# Patient Record
Sex: Male | Born: 1955
Health system: Southern US, Community
[De-identification: ages and names within clinical notes are randomized; demographics above are authoritative.]

## PROBLEM LIST (undated history)

## (undated) ENCOUNTER — Emergency Department (HOSPITAL_COMMUNITY): Admission: EM | Payer: Federal, State, Local not specified - PPO | Source: Home / Self Care

## (undated) DIAGNOSIS — E559 Vitamin D deficiency, unspecified: Secondary | ICD-10-CM

## (undated) DIAGNOSIS — F17200 Nicotine dependence, unspecified, uncomplicated: Secondary | ICD-10-CM

## (undated) DIAGNOSIS — I2510 Atherosclerotic heart disease of native coronary artery without angina pectoris: Secondary | ICD-10-CM

## (undated) DIAGNOSIS — E118 Type 2 diabetes mellitus with unspecified complications: Secondary | ICD-10-CM

## (undated) DIAGNOSIS — S4980XA Other specified injuries of shoulder and upper arm, unspecified arm, initial encounter: Secondary | ICD-10-CM

## (undated) DIAGNOSIS — G453 Amaurosis fugax: Secondary | ICD-10-CM

## (undated) DIAGNOSIS — I200 Unstable angina: Secondary | ICD-10-CM

## (undated) DIAGNOSIS — E7800 Pure hypercholesterolemia, unspecified: Secondary | ICD-10-CM

## (undated) DIAGNOSIS — R1310 Dysphagia, unspecified: Secondary | ICD-10-CM

## (undated) DIAGNOSIS — M5412 Radiculopathy, cervical region: Secondary | ICD-10-CM

## (undated) DIAGNOSIS — E119 Type 2 diabetes mellitus without complications: Secondary | ICD-10-CM

## (undated) DIAGNOSIS — Z0000 Encounter for general adult medical examination without abnormal findings: Secondary | ICD-10-CM

## (undated) DIAGNOSIS — L301 Dyshidrosis [pompholyx]: Secondary | ICD-10-CM

## (undated) DIAGNOSIS — F329 Major depressive disorder, single episode, unspecified: Secondary | ICD-10-CM

## (undated) DIAGNOSIS — K219 Gastro-esophageal reflux disease without esophagitis: Secondary | ICD-10-CM

## (undated) DIAGNOSIS — G5793 Unspecified mononeuropathy of bilateral lower limbs: Secondary | ICD-10-CM

## (undated) DIAGNOSIS — I25118 Atherosclerotic heart disease of native coronary artery with other forms of angina pectoris: Secondary | ICD-10-CM

## (undated) DIAGNOSIS — C9030 Solitary plasmacytoma not having achieved remission: Secondary | ICD-10-CM

## (undated) DIAGNOSIS — I6529 Occlusion and stenosis of unspecified carotid artery: Secondary | ICD-10-CM

## (undated) DIAGNOSIS — R9431 Abnormal electrocardiogram [ECG] [EKG]: Secondary | ICD-10-CM

## (undated) DIAGNOSIS — Z9289 Personal history of other medical treatment: Secondary | ICD-10-CM

## (undated) DIAGNOSIS — I6509 Occlusion and stenosis of unspecified vertebral artery: Secondary | ICD-10-CM

## (undated) DIAGNOSIS — R0602 Shortness of breath: Secondary | ICD-10-CM

## (undated) DIAGNOSIS — I10 Essential (primary) hypertension: Secondary | ICD-10-CM

## (undated) DIAGNOSIS — M899 Disorder of bone, unspecified: Secondary | ICD-10-CM

## (undated) DIAGNOSIS — E785 Hyperlipidemia, unspecified: Secondary | ICD-10-CM

## (undated) DIAGNOSIS — I2583 Coronary atherosclerosis due to lipid rich plaque: Secondary | ICD-10-CM

## (undated) DIAGNOSIS — R9439 Abnormal result of other cardiovascular function study: Secondary | ICD-10-CM

## (undated) HISTORY — DX: Amaurosis fugax: G45.3

## (undated) HISTORY — DX: Nicotine dependence, unspecified, uncomplicated: F17.200

## (undated) HISTORY — DX: Pure hypercholesterolemia, unspecified: E78.00

## (undated) HISTORY — DX: Hyperlipidemia, unspecified: E78.5

## (undated) HISTORY — DX: Type 2 diabetes mellitus with unspecified complications: E11.8

## (undated) HISTORY — DX: Unspecified mononeuropathy of bilateral lower limbs: G57.93

## (undated) HISTORY — DX: Occlusion and stenosis of unspecified vertebral artery: I65.09

## (undated) HISTORY — DX: Atherosclerotic heart disease of native coronary artery with other forms of angina pectoris: I25.118

## (undated) HISTORY — DX: Unstable angina: I20.0

## (undated) HISTORY — DX: Atherosclerotic heart disease of native coronary artery without angina pectoris: I25.10

## (undated) HISTORY — DX: Solitary plasmacytoma not having achieved remission: C90.30

## (undated) HISTORY — DX: Disorder of bone, unspecified: M89.9

## (undated) HISTORY — DX: Dysphagia, unspecified: R13.10

## (undated) HISTORY — DX: Radiculopathy, cervical region: M54.12

## (undated) HISTORY — DX: Coronary atherosclerosis due to lipid rich plaque: I25.83

## (undated) HISTORY — DX: Vitamin D deficiency, unspecified: E55.9

## (undated) HISTORY — DX: Abnormal electrocardiogram (ECG) (EKG): R94.31

## (undated) HISTORY — DX: Essential (primary) hypertension: I10

## (undated) HISTORY — DX: Gastro-esophageal reflux disease without esophagitis: K21.9

## (undated) HISTORY — DX: Major depressive disorder, single episode, unspecified: F32.9

## (undated) HISTORY — DX: Encounter for general adult medical examination without abnormal findings: Z00.00

## (undated) HISTORY — PX: CATARACT EXTRACTION: SUR2

## (undated) HISTORY — DX: Abnormal result of other cardiovascular function study: R94.39

## (undated) HISTORY — DX: Occlusion and stenosis of unspecified carotid artery: I65.29

## (undated) HISTORY — DX: Shortness of breath: R06.02

## (undated) HISTORY — DX: Personal history of other medical treatment: Z92.89

## (undated) HISTORY — DX: Dyshidrosis (pompholyx): L30.1

## (undated) HISTORY — DX: Other specified injuries of shoulder and upper arm, unspecified arm, initial encounter: S49.80XA

---

## 1984-11-30 HISTORY — PX: INGUINAL HERNIA REPAIR: SUR1180

## 2009-03-21 ENCOUNTER — Encounter: Payer: Self-pay | Admitting: Internal Medicine

## 2009-03-21 ENCOUNTER — Telehealth (INDEPENDENT_AMBULATORY_CARE_PROVIDER_SITE_OTHER): Payer: Self-pay | Admitting: Radiology

## 2009-03-21 ENCOUNTER — Ambulatory Visit: Payer: Self-pay | Admitting: Internal Medicine

## 2009-03-21 DIAGNOSIS — F3289 Other specified depressive episodes: Secondary | ICD-10-CM

## 2009-03-21 DIAGNOSIS — R0609 Other forms of dyspnea: Secondary | ICD-10-CM | POA: Insufficient documentation

## 2009-03-21 DIAGNOSIS — R0989 Other specified symptoms and signs involving the circulatory and respiratory systems: Secondary | ICD-10-CM | POA: Insufficient documentation

## 2009-03-21 DIAGNOSIS — F17200 Nicotine dependence, unspecified, uncomplicated: Secondary | ICD-10-CM

## 2009-03-21 DIAGNOSIS — F329 Major depressive disorder, single episode, unspecified: Secondary | ICD-10-CM

## 2009-03-21 DIAGNOSIS — R9431 Abnormal electrocardiogram [ECG] [EKG]: Secondary | ICD-10-CM

## 2009-03-21 HISTORY — DX: Nicotine dependence, unspecified, uncomplicated: F17.200

## 2009-03-21 HISTORY — DX: Major depressive disorder, single episode, unspecified: F32.9

## 2009-03-21 HISTORY — DX: Other specified depressive episodes: F32.89

## 2009-03-21 HISTORY — DX: Abnormal electrocardiogram (ECG) (EKG): R94.31

## 2009-03-21 LAB — CONVERTED CEMR LAB
ALT: 18 units/L (ref 0–53)
AST: 20 units/L (ref 0–37)
Albumin: 4 g/dL (ref 3.5–5.2)
Alkaline Phosphatase: 61 units/L (ref 39–117)
BUN: 15 mg/dL (ref 6–23)
Basophils Absolute: 0.1 10*3/uL (ref 0.0–0.1)
Basophils Relative: 0.8 % (ref 0.0–3.0)
Bilirubin Urine: NEGATIVE
Bilirubin, Direct: 0.1 mg/dL (ref 0.0–0.3)
CO2: 31 meq/L (ref 19–32)
Calcium: 9.2 mg/dL (ref 8.4–10.5)
Chloride: 105 meq/L (ref 96–112)
Cholesterol: 243 mg/dL — ABNORMAL HIGH (ref 0–200)
Creatinine, Ser: 0.7 mg/dL (ref 0.4–1.5)
Direct LDL: 167.4 mg/dL
Eosinophils Absolute: 0.2 10*3/uL (ref 0.0–0.7)
Eosinophils Relative: 3.3 % (ref 0.0–5.0)
GFR calc non Af Amer: 125.87 mL/min (ref >60)
Glucose, Bld: 89 mg/dL (ref 70–99)
HCT: 42.5 % (ref 39.0–52.0)
HDL: 32.8 mg/dL — ABNORMAL LOW (ref >39.00)
Hemoglobin, Urine: NEGATIVE
Hemoglobin: 14.2 g/dL (ref 13.0–17.0)
Ketones, ur: NEGATIVE mg/dL
Leukocytes, UA: NEGATIVE
Lymphocytes Relative: 32.2 % (ref 12.0–46.0)
Lymphs Abs: 2.4 10*3/uL (ref 0.7–4.0)
MCHC: 33.3 g/dL (ref 30.0–36.0)
MCV: 87.4 fL (ref 78.0–100.0)
Monocytes Absolute: 0.7 10*3/uL (ref 0.1–1.0)
Monocytes Relative: 10.2 % (ref 3.0–12.0)
Neutro Abs: 3.9 10*3/uL (ref 1.4–7.7)
Neutrophils Relative %: 53.5 % (ref 43.0–77.0)
Nitrite: NEGATIVE
Platelets: 214 10*3/uL (ref 150.0–400.0)
Potassium: 4.2 meq/L (ref 3.5–5.1)
Pro B Natriuretic peptide (BNP): 6 pg/mL (ref 0.0–100.0)
RBC: 4.86 M/uL (ref 4.22–5.81)
RDW: 12.2 % (ref 11.5–14.6)
Sodium: 140 meq/L (ref 135–145)
Specific Gravity, Urine: 1.015 (ref 1.000–1.030)
TSH: 1.48 microintl units/mL (ref 0.35–5.50)
Total Bilirubin: 0.8 mg/dL (ref 0.3–1.2)
Total CHOL/HDL Ratio: 7
Total Protein, Urine: NEGATIVE mg/dL
Total Protein: 7.3 g/dL (ref 6.0–8.3)
Triglycerides: 170 mg/dL — ABNORMAL HIGH (ref 0.0–149.0)
Urine Glucose: NEGATIVE mg/dL
Urobilinogen, UA: 0.2 (ref 0.0–1.0)
VLDL: 34 mg/dL (ref 0.0–40.0)
WBC: 7.3 10*3/uL (ref 4.5–10.5)
pH: 6.5 (ref 5.0–8.0)

## 2009-05-29 ENCOUNTER — Telehealth: Payer: Self-pay | Admitting: Internal Medicine

## 2009-06-05 ENCOUNTER — Ambulatory Visit: Payer: Self-pay | Admitting: Internal Medicine

## 2009-06-11 ENCOUNTER — Telehealth (INDEPENDENT_AMBULATORY_CARE_PROVIDER_SITE_OTHER): Payer: Self-pay | Admitting: Radiology

## 2009-06-17 ENCOUNTER — Telehealth (INDEPENDENT_AMBULATORY_CARE_PROVIDER_SITE_OTHER): Payer: Self-pay | Admitting: Radiology

## 2009-06-18 ENCOUNTER — Ambulatory Visit: Payer: Self-pay

## 2009-06-18 ENCOUNTER — Encounter: Payer: Self-pay | Admitting: Cardiology

## 2009-06-19 ENCOUNTER — Ambulatory Visit: Payer: Self-pay | Admitting: Internal Medicine

## 2009-06-19 DIAGNOSIS — I10 Essential (primary) hypertension: Secondary | ICD-10-CM | POA: Insufficient documentation

## 2009-06-19 DIAGNOSIS — K219 Gastro-esophageal reflux disease without esophagitis: Secondary | ICD-10-CM

## 2009-06-19 HISTORY — DX: Essential (primary) hypertension: I10

## 2009-06-19 HISTORY — DX: Gastro-esophageal reflux disease without esophagitis: K21.9

## 2009-07-10 ENCOUNTER — Ambulatory Visit: Payer: Self-pay | Admitting: Internal Medicine

## 2009-08-14 ENCOUNTER — Ambulatory Visit: Payer: Self-pay | Admitting: Internal Medicine

## 2009-08-14 ENCOUNTER — Telehealth: Payer: Self-pay | Admitting: Internal Medicine

## 2009-09-13 ENCOUNTER — Telehealth: Payer: Self-pay | Admitting: Internal Medicine

## 2010-07-02 ENCOUNTER — Ambulatory Visit: Payer: Self-pay | Admitting: Internal Medicine

## 2010-07-02 DIAGNOSIS — M5412 Radiculopathy, cervical region: Secondary | ICD-10-CM

## 2010-07-02 DIAGNOSIS — S46909A Unspecified injury of unspecified muscle, fascia and tendon at shoulder and upper arm level, unspecified arm, initial encounter: Secondary | ICD-10-CM

## 2010-07-02 DIAGNOSIS — M25511 Pain in right shoulder: Secondary | ICD-10-CM | POA: Insufficient documentation

## 2010-07-02 DIAGNOSIS — S4980XA Other specified injuries of shoulder and upper arm, unspecified arm, initial encounter: Secondary | ICD-10-CM

## 2010-07-02 DIAGNOSIS — M542 Cervicalgia: Secondary | ICD-10-CM | POA: Insufficient documentation

## 2010-07-02 HISTORY — DX: Radiculopathy, cervical region: M54.12

## 2010-07-02 HISTORY — DX: Other specified injuries of shoulder and upper arm, unspecified arm, initial encounter: S49.80XA

## 2010-07-02 HISTORY — DX: Unspecified injury of unspecified muscle, fascia and tendon at shoulder and upper arm level, unspecified arm, initial encounter: S46.909A

## 2010-12-21 ENCOUNTER — Encounter: Payer: Self-pay | Admitting: Internal Medicine

## 2010-12-30 NOTE — Assessment & Plan Note (Signed)
Summary: right shoulder pain./cd   Vital Signs:  Patient profile:   55 year old male Height:      68 inches Weight:      145.50 pounds BMI:     22.20 O2 Sat:      97 % on Room air Temp:     98.2 degrees F oral Pulse rate:   85 / minute Pulse rhythm:   regular Resp:     16 per minute BP sitting:   122 / 70  (left arm) Cuff size:   large  Vitals Entered By: Rock Nephew CMA (July 02, 2010 8:32 AM)  O2 Flow:  Room air CC: Pt c/o right side shoulder/back pain// also had neck pain x 10days ago, Hypertension Management Is Patient Diabetic? No Pain Assessment Patient in pain? yes     Location: R shoulder/back Intensity: 8 Type: sharp/stabbing   Primary Care Provider:  Etta Grandchild MD  CC:  Pt c/o right side shoulder/back pain// also had neck pain x 10days ago and Hypertension Management.  History of Present Illness: He returns c/o injuring his neck about 2 weeks ago when he was lifting weights with his son. The neck pain radiates into his RUE and he has noticed fasiculations in his right arm. He says that he went to an [facility] and had normal plain xrays of his neck and has been taking ? muscle relaxer and hydrocodone but no nsaids. He doesn't feel like the pain is getting any better. He has not noticed any numbness, weakness, or tingling in his arms or legs. He also has right shoulder pain over the lateral deltoid that he thinks he had prior to the neck pain.  Hypertension History:      He denies headache, chest pain, palpitations, dyspnea with exertion, orthopnea, PND, peripheral edema, visual symptoms, neurologic problems, syncope, and side effects from treatment.  He notes no problems with any antihypertensive medication side effects.        Positive major cardiovascular risk factors include male age 47 years old or older, hypertension, and current tobacco user.  Negative major cardiovascular risk factors include no history of diabetes or hyperlipidemia and negative family  history for ischemic heart disease.        Positive history for target organ damage include prior stroke (or TIA).  Further assessment for target organ damage reveals no history of ASHD, cardiac end-organ damage (CHF/LVH), peripheral vascular disease, renal insufficiency, or hypertensive retinopathy.    Preventive Screening-Counseling & Management  Alcohol-Tobacco     Alcohol drinks/day: 0     Smoking Status: current     Smoking Cessation Counseling: yes     Smoke Cessation Stage: contemplative     Packs/Day: 0.5     Year Started: 1976     Pack years: 75     Tobacco Counseling: to quit use of tobacco products  Hep-HIV-STD-Contraception     Hepatitis Risk: no risk noted     HIV Risk: no risk noted     STD Risk: no risk noted     TSE monthly: yes      Sexual History:  currently monogamous.        Drug Use:  never and no.        Blood Transfusions:  no.        Travel History:  [place].    Medications Prior to Update: 1)  Benicar 20 Mg Tabs (Olmesartan Medoxomil) .... Once Daily For High Blood Pressure 2)  Jonne Ply  81mg  .... Take 1 Tablet By Mouth Once A Day 3)  Prilosec Otc 20 Mg Tbec (Omeprazole Magnesium) .... Once Daily For Acid Reflux  Current Medications (verified): 1)  Benicar 20 Mg Tabs (Olmesartan Medoxomil) .... Once Daily For High Blood Pressure 2)  Asa 81mg  .... Take 1 Tablet By Mouth Once A Day 3)  Prilosec Otc 20 Mg Tbec (Omeprazole Magnesium) .... Once Daily For Acid Reflux 4)  Naproxen 500 Mg Tabs (Naproxen) .... One By Mouth Two Times A Day Woth Food For Pain  Allergies (verified): No Known Drug Allergies  Past History:  Past Medical History: Last updated: 06/19/2009 Depression-never treated GERD Hypertension  Past Surgical History: Last updated: 03/21/2009 cataracts removed: right in 2009 at [facility], Left in 2004 in [place]  Family History: Last updated: 03/21/2009 Family History of Arthritis Family History Diabetes 1st degree relative Family  History of Stroke M 1st degree relative <50  Social History: Last updated: 03/21/2009 Occupation: Naval architect Married Current Smoker Alcohol use-no Drug use-no Regular exercise-no Religion affecting care: [place], raised in [place]  Risk Factors: Alcohol Use: 0 (07/02/2010) Exercise: no (03/21/2009)  Risk Factors: Smoking Status: current (07/02/2010) Packs/Day: 0.5 (07/02/2010)  Family History: Reviewed history from 03/21/2009 and no changes required. Family History of Arthritis Family History Diabetes 1st degree relative Family History of Stroke M 1st degree relative <50  Social History: Reviewed history from 03/21/2009 and no changes required. Occupation: Naval architect Married Current Smoker Alcohol use-no Drug use-no Regular exercise-no Religion affecting care: [place], raised in [place]  Review of Systems  The patient denies anorexia, fever, weight loss, weight gain, decreased hearing, hoarseness, chest pain, syncope, peripheral edema, prolonged cough, headaches, hemoptysis, abdominal pain, muscle weakness, suspicious skin lesions, depression, and enlarged lymph nodes.    Physical Exam  General:  alert, well-developed, well-nourished, well-hydrated, appropriate dress, normal appearance, healthy-appearing, cooperative to examination, and good hygiene.   Head:  normocephalic and atraumatic.   Eyes:  vision grossly intact, pupils equal, pupils round, and pupils reactive to light.   Neck:  supple, full ROM, no masses, no cervical lymphadenopathy, and no neck tenderness.   Lungs:  Normal respiratory effort, chest expands symmetrically. Lungs are clear to auscultation, no crackles or wheezes. Heart:  Normal rate and regular rhythm. S1 and S2 normal without gallop, murmur, click, rub or other extra sounds. Abdomen:  Bowel sounds positive,abdomen soft and non-tender without masses, organomegaly or hernias noted. Msk:  normal ROM, no joint tenderness, no  joint swelling, no joint warmth, no redness over joints, no joint deformities, no joint instability, no crepitation, and no muscle atrophy.   Pulses:  R and L carotid,radial,femoral,dorsalis pedis and posterior tibial pulses are full and equal bilaterally Extremities:  No clubbing, cyanosis, edema, or deformity noted with normal full range of motion of all joints.   Neurologic:  alert & oriented X3, cranial nerves II-XII intact, strength normal in all extremities, sensation intact to light touch, sensation intact to pinprick, gait normal, finger-to-nose normal, heel-to-shin normal, toes down bilaterally on Babinski, Romberg negative, RUE hyporeflexia, RLE hyporeflexia, LUE hyporeflexia, and LLE hyporeflexia.   Skin:  turgor normal, color normal, no rashes, no suspicious lesions, no ecchymoses, no petechiae, no purpura, no ulcerations, and no edema.   Cervical Nodes:  no anterior cervical adenopathy and no posterior cervical adenopathy.   Axillary Nodes:  no R axillary adenopathy and no L axillary adenopathy.   Psych:  Cognition and judgment appear intact. Alert and cooperative with normal attention span and concentration.  No apparent delusions, illusions, hallucinations   Impression & Recommendations:  Problem # 1:  CERVICAL RADICULOPATHY (ICD-723.4) Assessment New I think he has a herniated disc with impingement so will try depo-medrol IM and order MRI of C-spine Orders: Radiology Referral (Radiology)  Problem # 2:  SHOULDER INJURY, RIGHT (ICD-959.2) Assessment: New will look for boney lesions, spurs, dislocation, etc. Orders: Admin of Therapeutic Inj  intramuscular or subcutaneous (25366) Depo- Medrol 40mg  (J1030) Depo- Medrol 80mg  (J1040) Ketorolac-Toradol 15mg  (Y4034) T-Shoulder Right (73030TC)  Problem # 3:  NECK PAIN, RIGHT (ICD-723.1) try toradol His updated medication list for this problem includes:    Naproxen 500 Mg Tabs (Naproxen) ..... One by mouth two times a day woth food  for pain  Orders: Radiology Referral (Radiology)  Problem # 4:  HYPERTENSION (ICD-401.9) Assessment: Improved  His updated medication list for this problem includes:    Benicar 20 Mg Tabs (Olmesartan medoxomil) ..... Once daily for high blood pressure  BP today: 122/70 Prior BP: 120/76 (08/14/2009)  Prior 10 Yr Risk Heart Disease: Not enough information (03/21/2009)  Labs Reviewed: K+: 4.2 (03/21/2009) Creat: : 0.7 (03/21/2009)   Chol: 243 (03/21/2009)   HDL: 32.80 (03/21/2009)   TG: 170.0 (03/21/2009)  Complete Medication List: 1)  Benicar 20 Mg Tabs (Olmesartan medoxomil) .... Once daily for high blood pressure 2)  Asa 81mg   .... Take 1 tablet by mouth once a day 3)  Prilosec Otc 20 Mg Tbec (Omeprazole magnesium) .... Once daily for acid reflux 4)  Naproxen 500 Mg Tabs (Naproxen) .... One by mouth two times a day woth food for pain  Hypertension Assessment/Plan:      [name] hypertensive risk group is category C: Target organ damage and/or diabetes.  Today's blood pressure is 122/70.  His blood pressure goal is < 140/90.   Patient Instructions: 1)  Please schedule a follow-up appointment in 1 month. 2)  It is important that you exercise regularly at least 20 minutes 5 times a week. If you develop chest pain, have severe difficulty breathing, or feel very tired , stop exercising immediately and seek medical attention. 3)  Check your Blood Pressure regularly. If it is above 140/90: you should make an appointment. 4)  Take 650-1000mg  of Tylenol every 4-6 hours as needed for relief of pain or comfort of fever AVOID taking more than 4000mg   in a 24 hour period (can cause liver damage in higher doses). 5)  You may move around but avoid painful motions. Apply ice to sore area for 20 minutes 3-4 times a day for 2-3 days. 6)  Most patients (90%) with low back pain will improve with time (2-6 weeks). Keep active but avoid activities that are painful. Apply moist heat and/or ice to  lower back several times a day. Prescriptions: NAPROXEN 500 MG TABS (NAPROXEN) One by mouth two times a day woth food for pain  #50 x 0   Entered and Authorized by:   Etta Grandchild MD   Signed by:   Etta Grandchild MD on 07/02/2010   Method used:   Print then Give to Patient   RxID:   [id number]    Not Administered:    Influenza Vaccine not given due to: declined   Medication Administration  Injection # 1:    Medication: Depo- Medrol 80mg     Diagnosis: SHOULDER INJURY, RIGHT (ICD-959.2)    Route: IM    Site: R deltoid    Exp Date: 02/2013    Lot #:  [id number]    Mfr: pfizer    Patient tolerated injection without complications    Given by: Rock Nephew CMA (July 02, 2010 9:16 AM)  Injection # 2:    Medication: Depo- Medrol 40mg     Diagnosis: SHOULDER INJURY, RIGHT (ICD-959.2)    Route: IM    Site: R deltoid    Exp Date: 02/2013    Lot #: [id number]    Mfr: pfizer    Patient tolerated injection without complications    Given by: Rock Nephew CMA (July 02, 2010 9:16 AM)  Injection # 3:    Medication: Ketorolac-Toradol 15mg     Diagnosis: SHOULDER INJURY, RIGHT (ICD-959.2)    Route: IM    Site: RUOQ gluteus    Exp Date: 03/31/2011    Lot #: [id number]    Mfr: nova Plus    Patient tolerated injection without complications    Given by: Rock Nephew CMA (July 02, 2010 9:17 AM)  Orders Added: 1)  Admin of Therapeutic Inj  intramuscular or subcutaneous [96372] 2)  Depo- Medrol 40mg  [J1030] 3)  Depo- Medrol 80mg  [J1040] 4)  Ketorolac-Toradol 15mg  [J1885] 5)  Radiology Referral [Radiology] 6)  T-Shoulder Right [73030TC] 7)  Est. Patient Level IV [95621]  Appended Document: right shoulder pain./cd Patient received 60mg  of Ketorlac/la

## 2011-05-07 ENCOUNTER — Other Ambulatory Visit: Payer: Self-pay | Admitting: Internal Medicine

## 2011-12-29 ENCOUNTER — Other Ambulatory Visit: Payer: Self-pay | Admitting: Internal Medicine

## 2012-10-28 ENCOUNTER — Other Ambulatory Visit: Payer: Self-pay | Admitting: Internal Medicine

## 2013-03-22 ENCOUNTER — Encounter: Payer: Self-pay | Admitting: Internal Medicine

## 2013-03-22 ENCOUNTER — Ambulatory Visit (INDEPENDENT_AMBULATORY_CARE_PROVIDER_SITE_OTHER): Payer: Federal, State, Local not specified - PPO | Admitting: Internal Medicine

## 2013-03-22 VITALS — BP 120/72 | HR 99 | Temp 98.0°F | Ht 68.0 in | Wt 151.0 lb

## 2013-03-22 DIAGNOSIS — M5412 Radiculopathy, cervical region: Secondary | ICD-10-CM

## 2013-03-22 DIAGNOSIS — M5010 Cervical disc disorder with radiculopathy, unspecified cervical region: Secondary | ICD-10-CM

## 2013-03-22 MED ORDER — PREDNISONE 10 MG PO TABS: ORAL_TABLET | ORAL | Status: DC

## 2013-03-22 MED ORDER — DICLOFENAC SODIUM 1 % TD GEL: 2.0000 g | Freq: Four times a day (QID) | TRANSDERMAL | Status: DC

## 2013-03-22 MED ORDER — CYCLOBENZAPRINE HCL 5 MG PO TABS: 5.0000 mg | ORAL_TABLET | Freq: Three times a day (TID) | ORAL | Status: DC | PRN

## 2013-03-22 NOTE — Progress Notes (Signed)
  Subjective:    Patient ID: Donald Jennings, male    DOB: September 04, 1956, 57 y.o.   MRN: [id number]  HPI  Pt presents to the clinic today with c/o right shoulder pain. This started about 1 week ago. He c/o pain in his neck and has radiating pain down his right arm. He has not taken anything for the pain. He has had a previous injury to the right shoulder. This occurred a few years ago.   Review of Systems  History reviewed. No pertinent past medical history.  Current Outpatient Prescriptions  Medication Sig Dispense Refill  . BENICAR 20 MG tablet TAKE 1 TABLET EVERY DAY FOR HIGH BLOOD PRESSURE  90 tablet  3  . omeprazole (PRILOSEC OTC) 20 MG tablet Take 20 mg by mouth daily.       No current facility-administered medications for this visit.    No Known Allergies  History reviewed. No pertinent family history.  History   Social History  . Marital Status: Married    Spouse Name: N/A    Number of Children: N/A  . Years of Education: N/A   Occupational History  . Not on file.   Social History Main Topics  . Smoking status: Current Every Day Smoker  . Smokeless tobacco: Not on file  . Alcohol Use: Not on file  . Drug Use: Not on file  . Sexually Active: Not on file   Other Topics Concern  . Not on file   Social History Narrative  . No narrative on file     Constitutional: Denies fever, malaise, fatigue, headache or abrupt weight changes.  Musculoskeletal: Pt reports right shoulder pain. Denies decrease in range of motion, difficulty with gait, or joint swelling.  Neurological: Pt reports sharp shooting pains down his right arm. Denies dizziness, difficulty with memory, difficulty with speech or problems with balance and coordination.   No other specific complaints in a complete review of systems (except as listed in HPI above).     Objective:   Physical Exam   BP 120/72  Pulse 99  Temp(Src) 98 F (36.7 C) (Oral)  Ht 5\' 8"  (1.727 m)  Wt 151 lb (68.493 kg)  BMI 22.96  kg/m2  SpO2 96% Wt Readings from Last 3 Encounters:  03/22/13 151 lb (68.493 kg)  07/02/10 145 lb 8 oz (65.998 kg)  08/14/09 142 lb (64.411 kg)    General: Appears his stated age, well developed, well nourished in NAD. Neck: Normal range of motion. Neck supple, trachea midline. No massses, lumps or thyromegaly present.  Cardiovascular: Normal rate and rhythm. S1,S2 noted.  No murmur, rubs or gallops noted. No JVD or BLE edema. No carotid bruits noted. Pulmonary/Chest: Normal effort and positive vesicular breath sounds. No respiratory distress. No wheezes, rales or ronchi noted.  Neurological: Alert and oriented. Cranial nerves II-XII intact. Coordination normal. +DTRs bilaterally.    Assessment & Plan:   Neck pain with cervical radiculopathy, recurrent:  Reviewed xrays of right shoulder taken from [facility] Will treat will pred taper and muscle relaxer If not better in 1 week may need referral to ortho for MRI of neck eRx for Voltaren gel  RTC as needed or if pain persist or worsens

## 2013-03-22 NOTE — Patient Instructions (Signed)
Shoulder Exercises  EXERCISES   RANGE OF MOTION (ROM) AND STRETCHING EXERCISES  These exercises may help you when beginning to rehabilitate your injury. Your symptoms may resolve with or without further involvement from your physician, physical therapist or athletic trainer. While completing these exercises, remember:   · Restoring tissue flexibility helps normal motion to return to the joints. This allows healthier, less painful movement and activity.  · An effective stretch should be held for at least 30 seconds.  · A stretch should never be painful. You should only feel a gentle lengthening or release in the stretched tissue.  ROM - Pendulum  · Bend at the waist so that your right / left arm falls away from your body. Support yourself with your opposite hand on a solid surface, such as a table or a countertop.  · Your right / left arm should be perpendicular to the ground. If it is not perpendicular, you need to lean over farther. Relax the muscles in your right / left arm and shoulder as much as possible.  · Gently sway your hips and trunk so they move your right / left arm without any use of your right / left shoulder muscles.  · Progress your movements so that your right / left arm moves side to side, then forward and backward, and finally, both clockwise and counterclockwise.  · Complete __________ repetitions in each direction. Many people use this exercise to relieve discomfort in their shoulder as well as to gain range of motion.  Repeat __________ times. Complete this exercise __________ times per day.  STRETCH - Flexion, Standing  · Stand with good posture. With an underhand grip on your right / left hand and an overhand grip on the opposite hand, grasp a broomstick or cane so that your hands are a little more than shoulder-width apart.  · Keeping your right / left elbow straight and shoulder muscles relaxed, push the stick with your opposite hand to raise your right / left arm in front of your body and  then overhead. Raise your arm until you feel a stretch in your right / left shoulder, but before you have increased shoulder pain.  · Try to avoid shrugging your right / left shoulder as your arm rises by keeping your shoulder blade tucked down and toward your mid-back spine. Hold __________ seconds.  · Slowly return to the starting position.  Repeat __________ times. Complete this exercise __________ times per day.  STRETCH - Internal Rotation  · Place your right / left hand behind your back, palm-up.  · Throw a towel or belt over your opposite shoulder. Grasp the towel/belt with your right / left hand.  · While keeping an upright posture, gently pull up on the towel/belt until you feel a stretch in the front of your right / left shoulder.  · Avoid shrugging your right / left shoulder as your arm rises by keeping your shoulder blade tucked down and toward your mid-back spine.  · Hold __________. Release the stretch by lowering your opposite hand.  Repeat __________ times. Complete this exercise __________ times per day.  STRETCH - External Rotation and Abduction  · Stagger your stance through a doorframe. It does not matter which foot is forward.  · As instructed by your physician, physical therapist or athletic trainer, place your hands:  · And forearms above your head and on the door frame.  · And forearms at head-height and on the door frame.  · At elbow-height and   on the door frame.  · Keeping your head and chest upright and your stomach muscles tight to prevent over-extending your low-back, slowly shift your weight onto your front foot until you feel a stretch across your chest and/or in the front of your shoulders.  · Hold __________ seconds. Shift your weight to your back foot to release the stretch.  Repeat __________ times. Complete this stretch __________ times per day.   STRENGTHENING EXERCISES   These exercises may help you when beginning to rehabilitate your injury. They may resolve your symptoms with  or without further involvement from your physician, physical therapist or athletic trainer. While completing these exercises, remember:   · Muscles can gain both the endurance and the strength needed for everyday activities through controlled exercises.  · Complete these exercises as instructed by your physician, physical therapist or athletic trainer. Progress the resistance and repetitions only as guided.  · You may experience muscle soreness or fatigue, but the pain or discomfort you are trying to eliminate should never worsen during these exercises. If this pain does worsen, stop and make certain you are following the directions exactly. If the pain is still present after adjustments, discontinue the exercise until you can discuss the trouble with your clinician.  · If advised by your physician, during your recovery, avoid activity or exercises which involve actions that place your right / left hand or elbow above your head or behind your back or head. These positions stress the tissues which are trying to heal.  STRENGTH - Scapular Depression and Adduction  · With good posture, sit on a firm chair. Supported your arms in front of you with pillows, arm rests or a table top. Have your elbows in line with the sides of your body.  · Gently draw your shoulder blades down and toward your mid-back spine. Gradually increase the tension without tensing the muscles along the top of your shoulders and the back of your neck.  · Hold for __________ seconds. Slowly release the tension and relax your muscles completely before completing the next repetition.  · After you have practiced this exercise, remove the arm support and complete it in standing as well as sitting.  Repeat __________ times. Complete this exercise __________ times per day.   STRENGTH - External Rotators  · Secure a rubber exercise band/tubing to a fixed object so that it is at the same height as your right / left elbow when you are standing or sitting on a  firm surface.  · Stand or sit so that the secured exercise band/tubing is at your side that is not injured.  · Bend your elbow 90 degrees. Place a folded towel or small pillow under your right / left arm so that your elbow is a few inches away from your side.  · Keeping the tension on the exercise band/tubing, pull it away from your body, as if pivoting on your elbow. Be sure to keep your body steady so that the movement is only coming from your shoulder rotating.  · Hold __________ seconds. Release the tension in a controlled manner as you return to the starting position.  Repeat __________ times. Complete this exercise __________ times per day.   STRENGTH - Supraspinatus  · Stand or sit with good posture. Grasp a __________ weight or an exercise band/tubing so that your hand is "thumbs-up," like when you shake hands.  · Slowly lift your right / left hand from your thigh into the air, traveling about 30   degrees from straight out at your side. Lift your hand to shoulder height or as far as you can without increasing any shoulder pain. Initially, many people do not lift their hands above shoulder height.  · Avoid shrugging your right / left shoulder as your arm rises by keeping your shoulder blade tucked down and toward your mid-back spine.  · Hold for __________ seconds. Control the descent of your hand as you slowly return to your starting position.  Repeat __________ times. Complete this exercise __________ times per day.   STRENGTH - Shoulder Extensors  · Secure a rubber exercise band/tubing so that it is at the height of your shoulders when you are either standing or sitting on a firm arm-less chair.  · With a thumbs-up grip, grasp an end of the band/tubing in each hand. Straighten your elbows and lift your hands straight in front of you at shoulder height. Step back away from the secured end of band/tubing until it becomes tense.  · Squeezing your shoulder blades together, pull your hands down to the sides of  your thighs. Do not allow your hands to go behind you.  · Hold for __________ seconds. Slowly ease the tension on the band/tubing as you reverse the directions and return to the starting position.  Repeat __________ times. Complete this exercise __________ times per day.   STRENGTH - Scapular Retractors  · Secure a rubber exercise band/tubing so that it is at the height of your shoulders when you are either standing or sitting on a firm arm-less chair.  · With a palm-down grip, grasp an end of the band/tubing in each hand. Straighten your elbows and lift your hands straight in front of you at shoulder height. Step back away from the secured end of band/tubing until it becomes tense.  · Squeezing your shoulder blades together, draw your elbows back as you bend them. Keep your upper arm lifted away from your body throughout the exercise.  · Hold __________ seconds. Slowly ease the tension on the band/tubing as you reverse the directions and return to the starting position.  Repeat __________ times. Complete this exercise __________ times per day.  STRENGTH - Scapular Depressors  · Find a sturdy chair without wheels, such as a from a dining room table.  · Keeping your feet on the floor, lift your bottom from the seat and lock your elbows.  · Keeping your elbows straight, allow gravity to pull your body weight down. Your shoulders will rise toward your ears.  · Raise your body against gravity by drawing your shoulder blades down your back, shortening the distance between your shoulders and ears. Although your feet should always maintain contact with the floor, your feet should progressively support less body weight as you get stronger.  · Hold __________ seconds. In a controlled and slow manner, lower your body weight to begin the next repetition.  Repeat __________ times. Complete this exercise __________ times per day.   Document Released: 09/30/2005 Document Revised: 02/08/2012 Document Reviewed:  02/28/2009  [employer]® Patient Information ©2013 [employer].

## 2013-04-04 ENCOUNTER — Ambulatory Visit (INDEPENDENT_AMBULATORY_CARE_PROVIDER_SITE_OTHER): Payer: Federal, State, Local not specified - PPO | Admitting: Internal Medicine

## 2013-04-04 ENCOUNTER — Encounter: Payer: Self-pay | Admitting: Internal Medicine

## 2013-04-04 ENCOUNTER — Other Ambulatory Visit (INDEPENDENT_AMBULATORY_CARE_PROVIDER_SITE_OTHER): Payer: Federal, State, Local not specified - PPO

## 2013-04-04 VITALS — BP 122/78 | HR 88 | Temp 97.8°F | Resp 16 | Wt 154.0 lb

## 2013-04-04 DIAGNOSIS — I10 Essential (primary) hypertension: Secondary | ICD-10-CM

## 2013-04-04 DIAGNOSIS — Z0000 Encounter for general adult medical examination without abnormal findings: Secondary | ICD-10-CM

## 2013-04-04 DIAGNOSIS — IMO0001 Reserved for inherently not codable concepts without codable children: Secondary | ICD-10-CM

## 2013-04-04 DIAGNOSIS — M25511 Pain in right shoulder: Secondary | ICD-10-CM

## 2013-04-04 DIAGNOSIS — E559 Vitamin D deficiency, unspecified: Secondary | ICD-10-CM

## 2013-04-04 DIAGNOSIS — Z136 Encounter for screening for cardiovascular disorders: Secondary | ICD-10-CM

## 2013-04-04 DIAGNOSIS — M25519 Pain in unspecified shoulder: Secondary | ICD-10-CM

## 2013-04-04 DIAGNOSIS — R9431 Abnormal electrocardiogram [ECG] [EKG]: Secondary | ICD-10-CM

## 2013-04-04 DIAGNOSIS — Z23 Encounter for immunization: Secondary | ICD-10-CM

## 2013-04-04 HISTORY — DX: Encounter for general adult medical examination without abnormal findings: Z00.00

## 2013-04-04 HISTORY — DX: Vitamin D deficiency, unspecified: E55.9

## 2013-04-04 LAB — URINALYSIS, ROUTINE W REFLEX MICROSCOPIC
Bilirubin Urine: NEGATIVE
Ketones, ur: NEGATIVE
Nitrite: NEGATIVE
pH: 6 (ref 5.0–8.0)

## 2013-04-04 LAB — LIPID PANEL
Cholesterol: 197 mg/dL (ref 0–200)
HDL: 37.1 mg/dL — ABNORMAL LOW (ref >39.00)
Triglycerides: 124 mg/dL (ref 0.0–149.0)

## 2013-04-04 LAB — COMPREHENSIVE METABOLIC PANEL
ALT: 15 U/L (ref 0–53)
AST: 18 U/L (ref 0–37)
Albumin: 3.7 g/dL (ref 3.5–5.2)
Alkaline Phosphatase: 62 U/L (ref 39–117)
Glucose, Bld: 113 mg/dL — ABNORMAL HIGH (ref 70–99)
Potassium: 4.8 mEq/L (ref 3.5–5.1)
Sodium: 136 mEq/L (ref 135–145)
Total Bilirubin: 0.6 mg/dL (ref 0.3–1.2)
Total Protein: 6.9 g/dL (ref 6.0–8.3)

## 2013-04-04 LAB — CBC WITH DIFFERENTIAL/PLATELET
Basophils Absolute: 0.1 10*3/uL (ref 0.0–0.1)
Eosinophils Relative: 2.1 % (ref 0.0–5.0)
MCV: 84.1 fl (ref 78.0–100.0)
Monocytes Absolute: 0.7 10*3/uL (ref 0.1–1.0)
Neutrophils Relative %: 67.3 % (ref 43.0–77.0)
Platelets: 238 10*3/uL (ref 150.0–400.0)
RDW: 13.6 % (ref 11.5–14.6)
WBC: 9.7 10*3/uL (ref 4.5–10.5)

## 2013-04-04 LAB — PSA: PSA: 0.62 ng/mL (ref 0.10–4.00)

## 2013-04-04 NOTE — Patient Instructions (Signed)
Health Maintenance, Males A healthy lifestyle and preventative care can promote health and wellness.  Maintain regular health, dental, and eye exams.  Eat a healthy diet. Foods like vegetables, fruits, whole grains, low-fat dairy products, and lean protein foods contain the nutrients you need without too many calories. Decrease your intake of foods high in solid fats, added sugars, and salt. Get information about a proper diet from your caregiver, if necessary.  Regular physical exercise is one of the most important things you can do for your health. Most adults should get at least 150 minutes of moderate-intensity exercise (any activity that increases your heart rate and causes you to sweat) each week. In addition, most adults need muscle-strengthening exercises on 2 or more days a week.   Maintain a healthy weight. The body mass index (BMI) is a screening tool to identify possible weight problems. It provides an estimate of body fat based on height and weight. Your caregiver can help determine your BMI, and can help you achieve or maintain a healthy weight. For adults 20 years and older:  A BMI below 18.5 is considered underweight.  A BMI of 18.5 to 24.9 is normal.  A BMI of 25 to 29.9 is considered overweight.  A BMI of 30 and above is considered obese.  Maintain normal blood lipids and cholesterol by exercising and minimizing your intake of saturated fat. Eat a balanced diet with plenty of fruits and vegetables. Blood tests for lipids and cholesterol should begin at age 20 and be repeated every 5 years. If your lipid or cholesterol levels are high, you are over 50, or you are a high risk for heart disease, you may need your cholesterol levels checked more frequently.Ongoing high lipid and cholesterol levels should be treated with medicines, if diet and exercise are not effective.  If you smoke, find out from your caregiver how to quit. If you do not use tobacco, do not start.  If you  choose to drink alcohol, do not exceed 2 drinks per day. One drink is considered to be 12 ounces (355 mL) of beer, 5 ounces (148 mL) of wine, or 1.5 ounces (44 mL) of liquor.  Avoid use of street drugs. Do not share needles with anyone. Ask for help if you need support or instructions about stopping the use of drugs.  High blood pressure causes heart disease and increases the risk of stroke. Blood pressure should be checked at least every 1 to 2 years. Ongoing high blood pressure should be treated with medicines if weight loss and exercise are not effective.  If you are 45 to 56 years old, ask your caregiver if you should take aspirin to prevent heart disease.  Diabetes screening involves taking a blood sample to check your fasting blood sugar level. This should be done once every 3 years, after age 45, if you are within normal weight and without risk factors for diabetes. Testing should be considered at a younger age or be carried out more frequently if you are overweight and have at least 1 risk factor for diabetes.  Colorectal cancer can be detected and often prevented. Most routine colorectal cancer screening begins at the age of 50 and continues through age 75. However, your caregiver may recommend screening at an earlier age if you have risk factors for colon cancer. On a yearly basis, your caregiver may provide home test kits to check for hidden blood in the stool. Use of a small camera at the end of a tube,   to directly examine the colon (sigmoidoscopy or colonoscopy), can detect the earliest forms of colorectal cancer. Talk to your caregiver about this at age 50, when routine screening begins. Direct examination of the colon should be repeated every 5 to 10 years through age 75, unless early forms of pre-cancerous polyps or small growths are found.  Hepatitis C blood testing is recommended for all people born from 1945 through 1965 and any individual with known risks for hepatitis C.  Healthy  men should no longer receive prostate-specific antigen (PSA) blood tests as part of routine cancer screening. Consult with your caregiver about prostate cancer screening.  Testicular cancer screening is not recommended for adolescents or adult males who have no symptoms. Screening includes self-exam, caregiver exam, and other screening tests. Consult with your caregiver about any symptoms you have or any concerns you have about testicular cancer.  Practice safe sex. Use condoms and avoid high-risk sexual practices to reduce the spread of sexually transmitted infections (STIs).  Use sunscreen with a sun protection factor (SPF) of 30 or greater. Apply sunscreen liberally and repeatedly throughout the day. You should seek shade when your shadow is shorter than you. Protect yourself by wearing long sleeves, pants, a wide-brimmed hat, and sunglasses year round, whenever you are outdoors.  Notify your caregiver of new moles or changes in moles, especially if there is a change in shape or color. Also notify your caregiver if a mole is larger than the size of a pencil eraser.  A one-time screening for abdominal aortic aneurysm (AAA) and surgical repair of large AAAs by sound wave imaging (ultrasonography) is recommended for ages 65 to 75 years who are current or former smokers.  Stay current with your immunizations. Document Released: 05/14/2008 Document Revised: 02/08/2012 Document Reviewed: 04/13/2011 [employer] Patient Information 2013 [employer].  

## 2013-04-04 NOTE — Progress Notes (Signed)
Subjective:    Patient ID: Donald Jennings, male    DOB: 07-Nov-1956, 57 y.o.   MRN: [id number]  Hypertension This is a chronic problem. The current episode started more than 1 year ago. The problem has been gradually improving since onset. The problem is controlled. Pertinent negatives include no anxiety, blurred vision, chest pain, malaise/fatigue, neck pain, orthopnea, palpitations, peripheral edema, PND, shortness of breath or sweats. Agents associated with hypertension include NSAIDs. Risk factors for coronary artery disease include smoking/tobacco exposure. Past treatments include angiotensin blockers. The current treatment provides moderate improvement. Compliance problems include exercise and diet.       Review of Systems  Constitutional: Negative.  Negative for fever, chills, malaise/fatigue, diaphoresis, activity change, appetite change, fatigue and unexpected weight change.  HENT: Negative.  Negative for neck pain.   Eyes: Negative.  Negative for blurred vision.  Respiratory: Negative.  Negative for cough, chest tightness, shortness of breath, wheezing and stridor.   Cardiovascular: Negative.  Negative for chest pain, palpitations, orthopnea, leg swelling and PND.  Gastrointestinal: Negative.  Negative for nausea, vomiting, abdominal pain, diarrhea, constipation and blood in stool.  Endocrine: Negative.   Genitourinary: Negative.   Musculoskeletal: Positive for myalgias (all over) and arthralgias (right shoulder). Negative for back pain, joint swelling and gait problem.  Skin: Negative.  Negative for color change, pallor, rash and wound.  Allergic/Immunologic: Negative.   Neurological: Negative.  Negative for dizziness, speech difficulty, weakness and light-headedness.  Hematological: Negative.  Negative for adenopathy. Does not bruise/bleed easily.  Psychiatric/Behavioral: Negative.        Objective:   Physical Exam  Vitals reviewed. Constitutional: He is oriented to person,  place, and time. He appears well-developed and well-nourished. No distress.  HENT:  Head: Normocephalic and atraumatic.  Mouth/Throat: No oropharyngeal exudate.  Eyes: Conjunctivae are normal. Right eye exhibits no discharge. Left eye exhibits no discharge. No scleral icterus.  Neck: Normal range of motion. Neck supple. No JVD present. No tracheal deviation present. No thyromegaly present.  Cardiovascular: Normal rate, regular rhythm, normal heart sounds and intact distal pulses.  Exam reveals no gallop and no friction rub.   No murmur heard. Pulmonary/Chest: Effort normal and breath sounds normal. No stridor. No respiratory distress. He has no wheezes. He has no rales. He exhibits no tenderness.  Abdominal: Soft. Bowel sounds are normal. He exhibits no distension and no mass. There is no tenderness. There is no rebound and no guarding. Hernia confirmed negative in the right inguinal area and confirmed negative in the left inguinal area.  Genitourinary: Rectum normal, testes normal and penis normal. Rectal exam shows no external hemorrhoid, no internal hemorrhoid, no fissure, no mass, no tenderness and anal tone normal. Guaiac negative stool. Prostate is enlarged (1+ smooth symm BPH). Prostate is not tender. Right testis shows no mass, no swelling and no tenderness. Right testis is descended. Left testis shows no mass, no swelling and no tenderness. Left testis is descended. Circumcised. No penile erythema or penile tenderness. No discharge found.  Musculoskeletal: He exhibits no edema and no tenderness.       Right shoulder: He exhibits decreased range of motion. He exhibits no tenderness, no bony tenderness, no swelling, no effusion, no crepitus, no deformity, no laceration, no pain, no spasm, normal pulse and normal strength.  Lymphadenopathy:    He has no cervical adenopathy.       Right: No inguinal adenopathy present.       Left: No inguinal adenopathy present.  Neurological: He  is oriented to  person, place, and time.  Skin: Skin is warm and dry. No rash noted. He is not diaphoretic. No erythema. No pallor.  Psychiatric: He has a normal mood and affect. His behavior is normal. Judgment and thought content normal.     Lab Results  Component Value Date   WBC 7.3 03/21/2009   HGB 14.2 03/21/2009   HCT 42.5 03/21/2009   PLT 214.0 03/21/2009   GLUCOSE 89 03/21/2009   CHOL 243* 03/21/2009   TRIG 170.0* 03/21/2009   HDL 32.80* 03/21/2009   LDLDIRECT 167.4 03/21/2009   ALT 18 03/21/2009   AST 20 03/21/2009   NA 140 03/21/2009   K 4.2 03/21/2009   CL 105 03/21/2009   CREATININE 0.7 03/21/2009   BUN 15 03/21/2009   CO2 31 03/21/2009   TSH 1.48 03/21/2009       Assessment & Plan:

## 2013-04-05 ENCOUNTER — Encounter: Payer: Self-pay | Admitting: Internal Medicine

## 2013-04-05 LAB — VITAMIN D 25 HYDROXY (VIT D DEFICIENCY, FRACTURES): Vit D, 25-Hydroxy: 27 ng/mL — ABNORMAL LOW (ref 30–89)

## 2013-04-05 MED ORDER — CHOLECALCIFEROL 1.25 MG (50000 UT) PO TABS: 1.0000 | ORAL_TABLET | ORAL | Status: DC

## 2013-04-05 NOTE — Assessment & Plan Note (Signed)
He has no suspicious s/s today His EKG appears stable

## 2013-04-05 NOTE — Assessment & Plan Note (Signed)
orthoreferral

## 2013-04-05 NOTE — Assessment & Plan Note (Signed)
His BP is well controlled I will check his lytes and renal function today 

## 2013-04-05 NOTE — Assessment & Plan Note (Signed)
Exam done Labs ordered Vaccines were reviewed Pt ed material was given 

## 2013-04-05 NOTE — Assessment & Plan Note (Signed)
I have asked him to start Cholecalciferol 50,000 u weekly

## 2013-04-05 NOTE — Assessment & Plan Note (Signed)
I think this is related to the Vit D deficiency Will treat the Vit D deficiency

## 2013-10-30 ENCOUNTER — Other Ambulatory Visit: Payer: Self-pay | Admitting: Internal Medicine

## 2013-12-29 ENCOUNTER — Encounter: Payer: Self-pay | Admitting: Physician Assistant

## 2013-12-29 ENCOUNTER — Ambulatory Visit: Payer: Federal, State, Local not specified - PPO | Admitting: Internal Medicine

## 2013-12-29 ENCOUNTER — Ambulatory Visit (INDEPENDENT_AMBULATORY_CARE_PROVIDER_SITE_OTHER): Payer: Federal, State, Local not specified - PPO | Admitting: Physician Assistant

## 2013-12-29 VITALS — BP 118/82 | HR 85 | Temp 97.9°F | Resp 18 | Ht 68.0 in | Wt 154.6 lb

## 2013-12-29 DIAGNOSIS — F17200 Nicotine dependence, unspecified, uncomplicated: Secondary | ICD-10-CM

## 2013-12-29 DIAGNOSIS — K219 Gastro-esophageal reflux disease without esophagitis: Secondary | ICD-10-CM

## 2013-12-29 DIAGNOSIS — I10 Essential (primary) hypertension: Secondary | ICD-10-CM

## 2013-12-29 DIAGNOSIS — R079 Chest pain, unspecified: Secondary | ICD-10-CM

## 2013-12-29 NOTE — Progress Notes (Signed)
Pre-visit discussion using our clinic review tool. No additional management support is needed unless otherwise documented below in the visit note.  

## 2013-12-29 NOTE — Patient Instructions (Signed)
It was great meeting you today Donald Jennings! I have ordered an exercise stress test, patient care coordinator will phone you to set up appointment.   Continue taking Omeprazole 20 mg tablet once daily.  Gastroesophageal Reflux Disease, Adult Gastroesophageal reflux disease (GERD) happens when acid from your stomach goes into your food pipe (esophagus). The acid can cause a burning feeling in your chest. Over time, the acid can make small holes (ulcers) in your food pipe.  HOME CARE  Ask your doctor for advice about:  Losing weight.  Quitting smoking.  Alcohol use.  Avoid foods and drinks that make your problems worse. You may want to avoid:  Caffeine and alcohol.  Chocolate.  Mints.  Garlic and onions.  Spicy foods.  Citrus fruits, such as oranges, lemons, or limes.  Foods that contain tomato, such as sauce, chili, salsa, and pizza.  Fried and fatty foods.  Avoid lying down for 3 hours before you go to bed or before you take a nap.  Eat small meals often, instead of large meals.  Wear loose-fitting clothing. Do not wear anything tight around your waist.  Raise (elevate) the head of your bed 6 to 8 inches with wood blocks. Using extra pillows does not help.  Only take medicines as told by your doctor.  Do not take aspirin or ibuprofen. GET HELP RIGHT AWAY IF:   You have pain in your arms, neck, jaw, teeth, or back.  Your pain gets worse or changes.  You feel sick to your stomach (nauseous), throw up (vomit), or sweat (diaphoresis).  You feel short of breath, or you pass out (faint).  Your throw up is green, yellow, black, or looks like coffee grounds or blood.  Your poop (stool) is red, bloody, or black. MAKE SURE YOU:   Understand these instructions.  Will watch your condition.  Will get help right away if you are not doing well or get worse. Document Released: 05/04/2008 Document Revised: 02/08/2012 Document Reviewed: 06/05/2011 [facility] Patient  Information 2014 [place], [place]. Chest Pain (Nonspecific) Chest pain has many causes. Your pain could be caused by something serious, such as a heart attack or a blood clot in the lungs. It could also be caused by something less serious, such as a chest bruise or a virus. Follow up with your doctor. More lab tests or other studies may be needed to find the cause of your pain. Most of the time, nonspecific chest pain will improve within 2 to 3 days of rest and mild pain medicine. HOME CARE  For chest bruises, you may put ice on the sore area for 15-20 minutes, 03-04 times a day. Do this only if it makes you feel better.  Put ice in a plastic bag.  Place a towel between the skin and the bag.  Rest for the next 2 to 3 days.  Go back to work if the pain improves.  See your doctor if the pain lasts longer than 1 to 2 weeks.  Only take medicine as told by your doctor.  Quit smoking if you smoke. GET HELP RIGHT AWAY IF:   There is more pain or pain that spreads to the arm, neck, jaw, back, or belly (abdomen).  You have shortness of breath.  You cough more than usual or cough up blood.  You have very bad back or belly pain, feel sick to your stomach (nauseous), or throw up (vomit).  You have very bad weakness.  You pass out (faint).  You  have a fever. Any of these problems may be serious and may be an emergency. Do not wait to see if the problems will go away. Get medical help right away. Call your local emergency services 911 in [place] Do not drive yourself to the hospital. MAKE SURE YOU:   Understand these instructions.  Will watch this condition.  Will get help right away if you or your child is not doing well or gets worse. Document Released: 05/04/2008 Document Revised: 02/08/2012 Document Reviewed: 05/04/2008 [facility] Patient Information 2014 [place], [place].

## 2013-12-29 NOTE — Progress Notes (Signed)
Subjective:    Patient ID: Donald Jennings, male    DOB: 1956-03-22, 58 y.o.   MRN: [id number]  HPI Comments: Patient is a 58 year old male who presents to the clinic with complaint of chest pain. Patient reports he has had constant dull, mid sternal chest pain for the last three days with intermittent sharp pains. Reports one day PTA he had one sharp pain with nausea and had a sharp pain in left chest when he sneezed. Sharp pains last only seconds. Pains seem to be at random times, not noting if increase with physical exertion. Reports history of GERD, normally well controlled with Omeprazole however, has not had any in last two weeks.  Denies headache, lightheaded/dizziness, recent change in vision, diaphoresis, numbness or weakness. History significant for HTN and smoking.   Review of Systems  Constitutional: Negative for fever, chills and diaphoresis.  Eyes: Negative for pain and visual disturbance.  Respiratory: Positive for chest tightness (at time of pains). Negative for cough, shortness of breath and wheezing.   Cardiovascular: Positive for chest pain (constant dull) and palpitations (has had in past a couple of weeks PTA). Negative for leg swelling.  Gastrointestinal: Positive for nausea (one episode, resolved). Negative for vomiting.  Musculoskeletal: Negative.   Skin: Negative for rash.  Neurological: Negative for dizziness, weakness, numbness and headaches.  All other systems reviewed and are negative.   Past Medical History  Diagnosis Date  . TOBACCO USE 03/21/2009    Qualifier: Diagnosis of  By: Ronnald Ramp MD, Arvid Right.   . DEPRESSION 03/21/2009    Qualifier: Diagnosis of  By: Ronnald Ramp MD, Arvid Right.   . HYPERTENSION 06/19/2009    Qualifier: Diagnosis of  By: Ronnald Ramp MD, Arvid Right.   [name] GERD 06/19/2009    Qualifier: Diagnosis of  By: Ronnald Ramp MD, Arvid Right.   . CERVICAL RADICULOPATHY 07/02/2010    Qualifier: Diagnosis of  By: Ronnald Ramp MD, Artist Pais INJURY, RIGHT 07/02/2010    Qualifier:  Diagnosis of  By: Ronnald Ramp MD, Arvid Right.    Family History  Problem Relation Age of Onset  . Arthritis Other     Parent  . Diabetes Other     Parent  . Stroke Other     Parent  . Cancer Neg Hx   . Early death Neg Hx   . Heart disease Neg Hx   . Hypertension Neg Hx   . Hyperlipidemia Neg Hx   . Kidney disease Neg Hx    History   Social History  . Marital Status: Married    Spouse Name: N/A    Number of Children: N/A  . Years of Education: N/A   Occupational History  . Scientist, clinical (histocompatibility and immunogenetics)    Social History Main Topics  . Smoking status: Current Every Day Smoker  . Smokeless tobacco: Never Used  . Alcohol Use: No  . Drug Use: No  . Sexual Activity: None   Other Topics Concern  . None   Social History Narrative  . None       Objective:   Physical Exam  Vitals reviewed. Constitutional: He is oriented to person, place, and time. He appears well-developed and well-nourished. No distress.  HENT:  Head: Normocephalic and atraumatic.  Right Ear: External ear normal.  Left Ear: External ear normal.  Eyes: Conjunctivae are normal.  Neck: Normal range of motion.  Cardiovascular: Normal rate, regular rhythm, normal heart sounds and intact distal pulses.  Exam reveals no gallop and  no friction rub.   No murmur heard. Pulmonary/Chest: Effort normal and breath sounds normal. He has no wheezes. He has no rhonchi. He has no rales. He exhibits no tenderness and no bony tenderness.  Unable to reproduce pain through palpation or exertion of chest wall muscles.   Abdominal: Soft. There is no tenderness.  Musculoskeletal: Normal range of motion.  Neurological: He is alert and oriented to person, place, and time.  Skin: Skin is warm and dry. He is not diaphoretic.  Psychiatric: He has a normal mood and affect.    Lab Results  Component Value Date   WBC 9.7 04/04/2013   HGB 13.7 04/04/2013   HCT 40.3 04/04/2013   PLT 238.0 04/04/2013   GLUCOSE 113* 04/04/2013   CHOL 197 04/04/2013    TRIG 124.0 04/04/2013   HDL 37.10* 04/04/2013   LDLDIRECT 167.4 03/21/2009   LDLCALC 135* 04/04/2013   ALT 15 04/04/2013   AST 18 04/04/2013   NA 136 04/04/2013   K 4.8 04/04/2013   CL 102 04/04/2013   CREATININE 0.9 04/04/2013   BUN 17 04/04/2013   CO2 29 04/04/2013   TSH 0.98 04/04/2013   PSA 0.62 04/04/2013   EKR Review:NSR, 77 bpm    Assessment & Plan:   Chest Pain. ekg normal sinus rhythm, patient is smoker with HTN, will send for stress test and instruct patient to continue treatment of GERD with Omeprazole. Patient states does not require Rx for PPI, prefers to get at [facility] for less than cost of prescription.   GERD: omeprazole 20 mg once daily  HTN: Continue meds as prescribed  Smoker: Patient counseled to discontinue smoking, explaining can contribute to GERD symptoms

## 2014-02-15 ENCOUNTER — Encounter: Payer: Federal, State, Local not specified - PPO | Admitting: Physician Assistant

## 2014-03-19 ENCOUNTER — Other Ambulatory Visit: Payer: Self-pay | Admitting: Internal Medicine

## 2014-03-23 ENCOUNTER — Ambulatory Visit (INDEPENDENT_AMBULATORY_CARE_PROVIDER_SITE_OTHER): Payer: Federal, State, Local not specified - PPO | Admitting: Physician Assistant

## 2014-03-23 DIAGNOSIS — R9439 Abnormal result of other cardiovascular function study: Secondary | ICD-10-CM

## 2014-03-23 DIAGNOSIS — R079 Chest pain, unspecified: Secondary | ICD-10-CM

## 2014-03-23 NOTE — Patient Instructions (Signed)
Your physician has requested that you have en exercise stress myoview. For further information please visit [email or web address]. Please follow instruction sheet, as given.   

## 2014-03-23 NOTE — Progress Notes (Signed)
Exercise Treadmill Test  Donald Jennings is a 58 y.o. male smoker with a hx of HTN who is referred for ETT.  He has occ chest pain with certain meals.  No exertional chest pain.  He does have DOE. No syncope.  ECG: NSR no ST changes.  Exam unremarkable.  Pre-Exercise Testing Evaluation Rhythm: normal sinus  Rate: 72 bpm     Test  Exercise Tolerance Test Ordering MD: Dorris Carnes, MD  Interpreting MD: Richardson Dopp, PA-C  Unique Test No: 1  Treadmill:  1  Indication for ETT: chest pain - rule out ischemia  Contraindication to ETT: No   Stress Modality: exercise - treadmill  Cardiac Imaging Performed: non   Protocol: standard Bruce - maximal  Max BP:  209/82  Max MPHR (bpm):  163 85% MPR (bpm):  139  MPHR obtained (bpm):  160 % MPHR obtained:  98  Reached 85% MPHR (min:sec):  7:45 Total Exercise Time (min-sec):  10:00  Workload in METS:  11.6 Borg Scale: 15  Reason ETT Terminated:  patient's desire to stop    ST Segment Analysis At Rest: normal ST segments - no evidence of significant ST depression With Exercise: borderline ST changes  Other Information Arrhythmia:  No Angina during ETT:  absent (0) Quality of ETT:  indeterminate  ETT Interpretation:  borderline (indeterminate) with non-specific ST changes  Comments: Good exercise capacity. No chest pain. Normal BP response to exercise. There was borderline ST depression (1-2 mm) in inferolateral leads at peak exercise.  Recommendations: Recommend proceeding with nuclear stress perfusion study. This will be arranged. F/u with PCP as recommended. Signed,  Richardson Dopp, PA-C   03/23/2014 11:01 AM

## 2014-04-04 ENCOUNTER — Encounter: Payer: Self-pay | Admitting: Cardiology

## 2014-04-11 ENCOUNTER — Ambulatory Visit (HOSPITAL_COMMUNITY): Payer: Federal, State, Local not specified - PPO | Attending: Cardiovascular Disease | Admitting: Radiology

## 2014-04-11 VITALS — BP 141/75 | HR 64 | Ht 68.0 in | Wt 146.0 lb

## 2014-04-11 DIAGNOSIS — R9439 Abnormal result of other cardiovascular function study: Secondary | ICD-10-CM

## 2014-04-11 DIAGNOSIS — R079 Chest pain, unspecified: Secondary | ICD-10-CM | POA: Insufficient documentation

## 2014-04-11 DIAGNOSIS — R9431 Abnormal electrocardiogram [ECG] [EKG]: Secondary | ICD-10-CM | POA: Insufficient documentation

## 2014-04-11 MED ORDER — TECHNETIUM TC 99M SESTAMIBI GENERIC - CARDIOLITE
33.0000 | Freq: Once | INTRAVENOUS | Status: AC | PRN
  Administered 2014-04-11: 33 via INTRAVENOUS

## 2014-04-11 MED ORDER — TECHNETIUM TC 99M SESTAMIBI GENERIC - CARDIOLITE
11.0000 | Freq: Once | INTRAVENOUS | Status: AC | PRN
  Administered 2014-04-11: 11 via INTRAVENOUS

## 2014-04-11 NOTE — Progress Notes (Signed)
[facility] [address] [place], [place] [address] [phone]    Cardiology Nuclear Med Study  Donald Jennings is a 58 y.o. male     MRN : [id number]     DOB: 10-20-1956  Procedure Date: 04/11/2014  Nuclear Med Background Indication for Stress Test:  Evaluation for Ischemia and Abnormal GXT History:  No known CAD, MPI 2010 (normal) EF 62% Cardiac Risk Factors: Family History - CAD and Hypertension  Symptoms:  Chest Pain (last date of chest discomfort was two days ago)   Nuclear Pre-Procedure Caffeine/Decaff Intake:  None NPO After: 11:00pm   Lungs:  clear O2 Sat: 98% on room air. IV 0.9% NS with Angio Cath:  22g  IV Site: R Hand  IV Started by:  Crissie Figures, RN  Chest Size (in):  40 Cup Size: n/a  Height: 5\' 8"  (1.727 m)  Weight:  146 lb (66.225 kg)  BMI:  Body mass index is 22.2 kg/(m^2). Tech Comments:  N/A    Nuclear Med Study 1 or 2 day study: 1 day  Stress Test Type:  Stress  Reading MD: N/A  Order Authorizing Provider:  Scarlette Calico, MD  Resting Radionuclide: Technetium 33m Sestamibi  Resting Radionuclide Dose: 11.0 mCi   Stress Radionuclide:  Technetium 101m Sestamibi  Stress Radionuclide Dose: 33.0 mCi           Stress Protocol Rest HR: 64    Stress HR: 155  Rest BP: 141/75 Stress BP: 198/52  Exercise Time (min): 10:30 METS: 12.5           Dose of Adenosine (mg):  n/a Dose of Lexiscan: n/a mg  Dose of Atropine (mg): n/a Dose of Dobutamine: n/a mcg/kg/min (at max HR)  Stress Test Technologist: Glade Lloyd, BS-ES  Nuclear Technologist:  Annye Rusk, CNMT     Rest Procedure:  Myocardial perfusion imaging was performed at rest 45 minutes following the intravenous administration of Technetium 66m Sestamibi. Rest ECG: Normal sinus rhythm with no significant abnormalities  Stress Procedure:  The patient exercised on the treadmill utilizing the Bruce Protocol for 10:30 minutes. The patient stopped due to fatigue and denied any  chest pain.  Technetium 91m Sestamibi was injected at peak exercise and myocardial perfusion imaging was performed after a brief delay. Stress ECG: No significant change from baseline ECG  QPS Raw Data Images:  Normal; no motion artifact; normal heart/lung ratio. Stress Images:  Normal homogeneous uptake in all areas of the myocardium. Rest Images:  Normal homogeneous uptake in all areas of the myocardium. Subtraction (SDS):  No evidence of ischemia. Transient Ischemic Dilatation (Normal <1.22):  0.94 Lung/Heart Ratio (Normal <0.45):  0.22  Quantitative Gated Spect Images QGS EDV:  86 ml QGS ESV:  38 ml  Impression Exercise Capacity:  Good exercise capacity. BP Response:  Normal blood pressure response. Clinical Symptoms:  No significant symptoms noted. ECG Impression:  No significant ST segment change suggestive of ischemia. Comparison with Prior Nuclear Study: I have compared this study to the report of the study from July, 2010. There is no significant change.  Overall Impression:  Normal stress nuclear study. There is no scar or ischemia. This is a low risk scan.  LV Ejection Fraction: 56%.  LV Wall Motion:  Normal Wall Motion.  Carlena Bjornstad, MD

## 2014-04-12 ENCOUNTER — Telehealth: Payer: Self-pay | Admitting: *Deleted

## 2014-04-12 ENCOUNTER — Encounter: Payer: Self-pay | Admitting: *Deleted

## 2014-04-12 ENCOUNTER — Encounter: Payer: Self-pay | Admitting: Physician Assistant

## 2014-04-12 NOTE — Telephone Encounter (Signed)
tried to reach pt and emergency contact for results and neither # is valid. I will send out a results letter to pt today.

## 2014-04-16 ENCOUNTER — Telehealth: Payer: Self-pay | Admitting: *Deleted

## 2014-04-16 NOTE — Telephone Encounter (Signed)
#'  s not valid, I sent out a letter 04/12/14 for pt tcb to go over normal myoview results

## 2014-04-20 ENCOUNTER — Telehealth: Payer: Self-pay | Admitting: Physician Assistant

## 2014-04-20 NOTE — Telephone Encounter (Signed)
lmptcb for normal myoview results 

## 2014-04-20 NOTE — Telephone Encounter (Signed)
pt notified about normal myoview, pt did say he had been having problems with his cell phone the 704 #, better to call the home # right now

## 2014-04-20 NOTE — Telephone Encounter (Signed)
New message     Pt received a letter from our office to call and get stress test results

## 2014-04-20 NOTE — Telephone Encounter (Signed)
Patient is returning your call. [phone]. Please call back today.

## 2014-12-09 ENCOUNTER — Other Ambulatory Visit: Payer: Self-pay | Admitting: Internal Medicine

## 2014-12-16 ENCOUNTER — Other Ambulatory Visit: Payer: Self-pay | Admitting: Internal Medicine

## 2014-12-21 ENCOUNTER — Other Ambulatory Visit: Payer: Self-pay | Admitting: Internal Medicine

## 2015-01-16 ENCOUNTER — Encounter: Payer: Self-pay | Admitting: Family

## 2015-01-16 ENCOUNTER — Telehealth: Payer: Self-pay | Admitting: Family

## 2015-01-16 ENCOUNTER — Ambulatory Visit (INDEPENDENT_AMBULATORY_CARE_PROVIDER_SITE_OTHER): Payer: Federal, State, Local not specified - PPO | Admitting: Family

## 2015-01-16 ENCOUNTER — Other Ambulatory Visit (INDEPENDENT_AMBULATORY_CARE_PROVIDER_SITE_OTHER): Payer: Federal, State, Local not specified - PPO

## 2015-01-16 VITALS — BP 112/68 | HR 91 | Temp 97.8°F | Resp 18 | Ht 68.0 in | Wt 163.0 lb

## 2015-01-16 DIAGNOSIS — R21 Rash and other nonspecific skin eruption: Secondary | ICD-10-CM

## 2015-01-16 DIAGNOSIS — R632 Polyphagia: Secondary | ICD-10-CM | POA: Insufficient documentation

## 2015-01-16 LAB — COMPREHENSIVE METABOLIC PANEL
ALBUMIN: 4.3 g/dL (ref 3.5–5.2)
ALT: 17 U/L (ref 0–53)
AST: 19 U/L (ref 0–37)
Alkaline Phosphatase: 58 U/L (ref 39–117)
BUN: 17 mg/dL (ref 6–23)
CHLORIDE: 105 meq/L (ref 96–112)
CO2: 28 mEq/L (ref 19–32)
CREATININE: 0.89 mg/dL (ref 0.40–1.50)
Calcium: 9.5 mg/dL (ref 8.4–10.5)
GFR: 93.37 mL/min (ref >60.00)
Glucose, Bld: 108 mg/dL — ABNORMAL HIGH (ref 70–99)
Potassium: 4.4 mEq/L (ref 3.5–5.1)
SODIUM: 137 meq/L (ref 135–145)
Total Bilirubin: 0.4 mg/dL (ref 0.2–1.2)
Total Protein: 7.7 g/dL (ref 6.0–8.3)

## 2015-01-16 LAB — HEMOGLOBIN A1C: Hgb A1c MFr Bld: 6.6 % — ABNORMAL HIGH (ref 4.6–6.5)

## 2015-01-16 MED ORDER — OLMESARTAN MEDOXOMIL 20 MG PO TABS: 20.0000 mg | ORAL_TABLET | Freq: Every day | ORAL | Status: DC

## 2015-01-16 MED ORDER — TRIAMCINOLONE ACETONIDE 0.025 % EX CREA: 1.0000 "application " | TOPICAL_CREAM | Freq: Two times a day (BID) | CUTANEOUS | Status: DC

## 2015-01-16 NOTE — Assessment & Plan Note (Signed)
New onset polyphagia. Obtain A1c to rule out diabetes per patient request.

## 2015-01-16 NOTE — Assessment & Plan Note (Signed)
Rash appears inflammatory in nature with possible eczematous features. Start triamcinolone. Continue moisturizers. Refer to dermatology for further management.

## 2015-01-16 NOTE — Telephone Encounter (Signed)
Please inform patient that his liver function, kidney function, and electrolytes are within the normal limits. His test for diabetes shows that his A1c was 6.6 which is consistent with a diagnosis of Type 2 diabetes. There are no medications that are needed at this time as the goal of therapy is a level less than 7. Please have him follow up with Dr. Ronnald Ramp or myself if he has any questions.

## 2015-01-16 NOTE — Patient Instructions (Addendum)
Thank you for choosing [facility].  Summary/Instructions:  Your prescription(s) have been submitted to your pharmacy or been printed and provided for you. Please take as directed and contact our office if you believe you are having problem(s) with the medication(s) or have any questions.  Please stop by the lab on the basement level of the building for your blood work. Your results will be released to Craig (or called to you) after review, usually within 72 hours after test completion. If any changes need to be made, you will be notified at that same time.  If your symptoms worsen or fail to improve, please contact our office for further instruction, or in case of emergency go directly to the emergency room at the closest medical facility.   Apply steroid cream 2x daily as needed. Continue to use moisturizers for your hand and feet.

## 2015-01-16 NOTE — Progress Notes (Signed)
   Subjective:    Patient ID: Donald Jennings, male    DOB: 1956-08-24, 59 y.o.   MRN: [id number]  Chief Complaint  Patient presents with  . Rash    has rash on both hands and both of the bottoms of his feet that has been going on for 6 months, they are like little bumps that if he squeezes them he gets stuff out of it, no itching     HPI:  Donald Jennings is a 59 y.o. male who presents today for an acute visit.   This is a new problem. Associated symptoms of a rash described as red, small bumps on the palms of his hands and soles of his feet has been going on for about 6 months. Indicates the spots come and go. There is no associated pain with the lesions. Has used moisturizers which seem to provide some help.  2) Concern for diabetes - indicates that his mother has diabetes and he has been having cravings for additional sugar at night and would like to be tested for diabetes.   No Known Allergies   Current Outpatient Prescriptions on File Prior to Visit  Medication Sig Dispense Refill  . Cholecalciferol 50000 UNITS TABS Take 1 tablet by mouth once a week. 12 tablet 3  . diclofenac sodium (VOLTAREN) 1 % GEL Apply 2 g topically 4 (four) times daily. 1 Tube 1  . omeprazole (PRILOSEC OTC) 20 MG tablet Take 20 mg by mouth daily.     No current facility-administered medications on file prior to visit.    Past Medical History  Diagnosis Date  . TOBACCO USE 03/21/2009    Qualifier: Diagnosis of  By: Ronnald Ramp MD, Arvid Right.   . DEPRESSION 03/21/2009    Qualifier: Diagnosis of  By: Ronnald Ramp MD, Arvid Right.   . HYPERTENSION 06/19/2009    Qualifier: Diagnosis of  By: Ronnald Ramp MD, Arvid Right.   [name] GERD 06/19/2009    Qualifier: Diagnosis of  By: Ronnald Ramp MD, Arvid Right.   . CERVICAL RADICULOPATHY 07/02/2010    Qualifier: Diagnosis of  By: Ronnald Ramp MD, Artist Pais INJURY, RIGHT 07/02/2010    Qualifier: Diagnosis of  By: Ronnald Ramp MD, Arvid Right.   [name] Hx of cardiovascular stress test     ETT-Myoview (03/2014):  No  ischemia, EF 56%; Low Risk    Review of Systems  Endocrine: Positive for polyphagia.  Skin: Positive for rash.      Objective:    BP 112/68 mmHg  Pulse 91  Temp(Src) 97.8 F (36.6 C) (Oral)  Resp 18  Ht 5\' 8"  (1.727 m)  Wt 163 lb (73.936 kg)  BMI 24.79 kg/m2  SpO2 96% Nursing note and vital signs reviewed.  Physical Exam  Constitutional: He is oriented to person, place, and time. He appears well-developed and well-nourished. No distress.  Cardiovascular: Normal rate, regular rhythm, normal heart sounds and intact distal pulses.   Pulmonary/Chest: Effort normal and breath sounds normal.  Neurological: He is alert and oriented to person, place, and time.  Skin: Skin is warm and dry.  Small macules/papules present on palms of hands and soles of feet in varies stages. Some with small red petichiae looking other with dry, skin over.   Psychiatric: He has a normal mood and affect. His behavior is normal. Judgment and thought content normal.       Assessment & Plan:

## 2015-01-17 NOTE — Telephone Encounter (Signed)
Tried calling all numbers given on pts chart. They were all disconnected or not working. Will send the lab results in the mail with the message below.

## 2015-02-07 ENCOUNTER — Encounter: Payer: Self-pay | Admitting: Internal Medicine

## 2015-03-27 ENCOUNTER — Encounter: Payer: Self-pay | Admitting: Internal Medicine

## 2015-03-27 ENCOUNTER — Other Ambulatory Visit (INDEPENDENT_AMBULATORY_CARE_PROVIDER_SITE_OTHER): Payer: Federal, State, Local not specified - PPO

## 2015-03-27 ENCOUNTER — Ambulatory Visit (INDEPENDENT_AMBULATORY_CARE_PROVIDER_SITE_OTHER): Payer: Federal, State, Local not specified - PPO | Admitting: Internal Medicine

## 2015-03-27 VITALS — BP 136/72 | HR 82 | Temp 97.8°F | Resp 16 | Ht 68.0 in | Wt 160.0 lb

## 2015-03-27 DIAGNOSIS — F17200 Nicotine dependence, unspecified, uncomplicated: Secondary | ICD-10-CM

## 2015-03-27 DIAGNOSIS — Z720 Tobacco use: Secondary | ICD-10-CM

## 2015-03-27 DIAGNOSIS — E118 Type 2 diabetes mellitus with unspecified complications: Secondary | ICD-10-CM | POA: Diagnosis not present

## 2015-03-27 DIAGNOSIS — I10 Essential (primary) hypertension: Secondary | ICD-10-CM

## 2015-03-27 DIAGNOSIS — L301 Dyshidrosis [pompholyx]: Secondary | ICD-10-CM

## 2015-03-27 DIAGNOSIS — S46911A Strain of unspecified muscle, fascia and tendon at shoulder and upper arm level, right arm, initial encounter: Secondary | ICD-10-CM

## 2015-03-27 DIAGNOSIS — M5010 Cervical disc disorder with radiculopathy, unspecified cervical region: Secondary | ICD-10-CM | POA: Diagnosis not present

## 2015-03-27 DIAGNOSIS — E559 Vitamin D deficiency, unspecified: Secondary | ICD-10-CM | POA: Diagnosis not present

## 2015-03-27 HISTORY — DX: Type 2 diabetes mellitus with unspecified complications: E11.8

## 2015-03-27 LAB — CBC WITH DIFFERENTIAL/PLATELET
BASOS PCT: 0.4 % (ref 0.0–3.0)
Basophils Absolute: 0 10*3/uL (ref 0.0–0.1)
EOS PCT: 2.3 % (ref 0.0–5.0)
Eosinophils Absolute: 0.2 10*3/uL (ref 0.0–0.7)
HCT: 40 % (ref 39.0–52.0)
Hemoglobin: 13.3 g/dL (ref 13.0–17.0)
LYMPHS ABS: 2.5 10*3/uL (ref 0.7–4.0)
Lymphocytes Relative: 27.9 % (ref 12.0–46.0)
MCHC: 33.4 g/dL (ref 30.0–36.0)
MCV: 83.2 fl (ref 78.0–100.0)
MONO ABS: 0.5 10*3/uL (ref 0.1–1.0)
Monocytes Relative: 6.2 % (ref 3.0–12.0)
Neutro Abs: 5.6 10*3/uL (ref 1.4–7.7)
Neutrophils Relative %: 63.2 % (ref 43.0–77.0)
PLATELETS: 228 10*3/uL (ref 150.0–400.0)
RBC: 4.81 Mil/uL (ref 4.22–5.81)
RDW: 14.1 % (ref 11.5–15.5)
WBC: 8.9 10*3/uL (ref 4.0–10.5)

## 2015-03-27 LAB — MICROALBUMIN / CREATININE URINE RATIO
CREATININE, U: 179.1 mg/dL
Microalb Creat Ratio: 1 mg/g (ref 0.0–30.0)
Microalb, Ur: 1.8 mg/dL (ref 0.0–1.9)

## 2015-03-27 LAB — URINALYSIS, ROUTINE W REFLEX MICROSCOPIC
Bilirubin Urine: NEGATIVE
Hgb urine dipstick: NEGATIVE
KETONES UR: NEGATIVE
Leukocytes, UA: NEGATIVE
Nitrite: NEGATIVE
SPECIFIC GRAVITY, URINE: 1.02 (ref 1.000–1.030)
TOTAL PROTEIN, URINE-UPE24: NEGATIVE
URINE GLUCOSE: NEGATIVE
UROBILINOGEN UA: 0.2 (ref 0.0–1.0)
pH: 6 (ref 5.0–8.0)

## 2015-03-27 LAB — COMPREHENSIVE METABOLIC PANEL
ALBUMIN: 4.2 g/dL (ref 3.5–5.2)
ALK PHOS: 65 U/L (ref 39–117)
ALT: 15 U/L (ref 0–53)
AST: 17 U/L (ref 0–37)
BUN: 20 mg/dL (ref 6–23)
CO2: 26 mEq/L (ref 19–32)
Calcium: 9.1 mg/dL (ref 8.4–10.5)
Chloride: 106 mEq/L (ref 96–112)
Creatinine, Ser: 1.44 mg/dL (ref 0.40–1.50)
GFR: 53.55 mL/min — ABNORMAL LOW (ref >60.00)
Glucose, Bld: 113 mg/dL — ABNORMAL HIGH (ref 70–99)
Potassium: 4.1 mEq/L (ref 3.5–5.1)
SODIUM: 137 meq/L (ref 135–145)
TOTAL PROTEIN: 7.1 g/dL (ref 6.0–8.3)
Total Bilirubin: 0.3 mg/dL (ref 0.2–1.2)

## 2015-03-27 LAB — LIPID PANEL
CHOL/HDL RATIO: 6
CHOLESTEROL: 208 mg/dL — AB (ref 0–200)
HDL: 35.5 mg/dL — AB (ref >39.00)
LDL CALC: 133 mg/dL — AB (ref 0–99)
NonHDL: 172.5
TRIGLYCERIDES: 199 mg/dL — AB (ref 0.0–149.0)
VLDL: 39.8 mg/dL (ref 0.0–40.0)

## 2015-03-27 LAB — TSH: TSH: 1.99 u[IU]/mL (ref 0.35–4.50)

## 2015-03-27 LAB — HEMOGLOBIN A1C: HEMOGLOBIN A1C: 6.5 % (ref 4.6–6.5)

## 2015-03-27 MED ORDER — DICLOFENAC SODIUM 1 % TD GEL: 2.0000 g | Freq: Four times a day (QID) | TRANSDERMAL | Status: DC

## 2015-03-27 MED ORDER — VARENICLINE TARTRATE 1 MG PO TABS: 1.0000 mg | ORAL_TABLET | Freq: Two times a day (BID) | ORAL | Status: DC

## 2015-03-27 MED ORDER — TRIAMCINOLONE ACETONIDE 0.025 % EX CREA: 1.0000 "application " | TOPICAL_CREAM | Freq: Two times a day (BID) | CUTANEOUS | Status: DC

## 2015-03-27 MED ORDER — VARENICLINE TARTRATE 0.5 MG X 11 & 1 MG X 42 PO MISC: ORAL | Status: DC

## 2015-03-27 NOTE — Progress Notes (Signed)
Subjective:    Patient ID: Donald Jennings, male    DOB: June 22, 1956, 59 y.o.   MRN: [id number]  Hypertension This is a chronic problem. The problem is unchanged. The problem is controlled. Pertinent negatives include no anxiety, blurred vision, chest pain, headaches, malaise/fatigue, neck pain, orthopnea, palpitations, peripheral edema, PND, shortness of breath or sweats. Past treatments include angiotensin blockers. The current treatment provides significant improvement. There are no compliance problems.       Review of Systems  Constitutional: Negative.  Negative for fever, chills, malaise/fatigue, diaphoresis, appetite change and fatigue.  HENT: Negative.   Eyes: Negative.  Negative for blurred vision.  Respiratory: Negative.  Negative for cough, choking, chest tightness, shortness of breath and stridor.   Cardiovascular: Negative.  Negative for chest pain, palpitations, orthopnea, leg swelling and PND.  Gastrointestinal: Negative.  Negative for nausea, vomiting, abdominal pain and diarrhea.  Endocrine: Negative.   Genitourinary: Negative.  Negative for urgency, hematuria and difficulty urinating.  Musculoskeletal: Positive for back pain. Negative for myalgias, arthralgias and neck pain.       He has had pain around his rt shoulder blade for 3 days after doing yard work  Skin: Negative.  Negative for rash.  Allergic/Immunologic: Negative.   Neurological: Negative.  Negative for dizziness, tremors, syncope, light-headedness and headaches.  Hematological: Negative.  Negative for adenopathy. Does not bruise/bleed easily.  Psychiatric/Behavioral: Negative.        Objective:   Physical Exam  Constitutional: He is oriented to person, place, and time. He appears well-developed and well-nourished. No distress.  HENT:  Head: Normocephalic and atraumatic.  Mouth/Throat: Oropharynx is clear and moist. No oropharyngeal exudate.  Eyes: Conjunctivae are normal. Right eye exhibits no discharge.  Left eye exhibits no discharge. No scleral icterus.  Neck: Normal range of motion. Neck supple. No JVD present. No tracheal deviation present. No thyromegaly present.  Cardiovascular: Normal rate, regular rhythm, normal heart sounds and intact distal pulses.  Exam reveals no gallop and no friction rub.   No murmur heard. Pulmonary/Chest: Effort normal and breath sounds normal. No stridor. No respiratory distress. He has no wheezes. He has no rales. He exhibits no tenderness.  Abdominal: Soft. Bowel sounds are normal. He exhibits no distension and no mass. There is no tenderness. There is no rebound and no guarding.  Musculoskeletal: Normal range of motion. He exhibits no edema or tenderness.       Thoracic back: Normal. He exhibits normal range of motion, no tenderness, no bony tenderness, no swelling, no edema, no deformity, no laceration, no pain, no spasm and normal pulse.       Back:  Lymphadenopathy:    He has no cervical adenopathy.  Neurological: He is oriented to person, place, and time.  Skin: Skin is warm and dry. No rash noted. He is not diaphoretic. No erythema. No pallor.  Psychiatric: He has a normal mood and affect. His behavior is normal. Judgment and thought content normal.  Vitals reviewed.    Lab Results  Component Value Date   WBC 9.7 04/04/2013   HGB 13.7 04/04/2013   HCT 40.3 04/04/2013   PLT 238.0 04/04/2013   GLUCOSE 108* 01/16/2015   CHOL 197 04/04/2013   TRIG 124.0 04/04/2013   HDL 37.10* 04/04/2013   LDLDIRECT 167.4 03/21/2009   LDLCALC 135* 04/04/2013   ALT 17 01/16/2015   AST 19 01/16/2015   NA 137 01/16/2015   K 4.4 01/16/2015   CL 105 01/16/2015   CREATININE 0.89  01/16/2015   BUN 17 01/16/2015   CO2 28 01/16/2015   TSH 0.98 04/04/2013   PSA 0.62 04/04/2013   HGBA1C 6.6* 01/16/2015       Assessment & Plan:

## 2015-03-27 NOTE — Patient Instructions (Signed)

## 2015-03-27 NOTE — Progress Notes (Signed)
Pre visit review using our clinic review tool, if applicable. No additional management support is needed unless otherwise documented below in the visit note. 

## 2015-03-28 DIAGNOSIS — S46919A Strain of unspecified muscle, fascia and tendon at shoulder and upper arm level, unspecified arm, initial encounter: Secondary | ICD-10-CM | POA: Insufficient documentation

## 2015-03-28 NOTE — Assessment & Plan Note (Signed)
His blood sugars are well controlled 

## 2015-03-28 NOTE — Assessment & Plan Note (Signed)
He has taken OTC dose of tylenol and aleve and the pain has resolved

## 2015-03-28 NOTE — Assessment & Plan Note (Signed)
He is ready to quit smoking Will start chantix

## 2015-03-28 NOTE — Assessment & Plan Note (Signed)
His BP is well controlled Will monitor his lytes and renal function 

## 2015-03-29 ENCOUNTER — Encounter: Payer: Self-pay | Admitting: Internal Medicine

## 2015-05-04 ENCOUNTER — Other Ambulatory Visit: Payer: Self-pay | Admitting: Family

## 2015-05-27 ENCOUNTER — Ambulatory Visit (INDEPENDENT_AMBULATORY_CARE_PROVIDER_SITE_OTHER): Payer: Federal, State, Local not specified - PPO | Admitting: Internal Medicine

## 2015-05-27 ENCOUNTER — Encounter: Payer: Self-pay | Admitting: Internal Medicine

## 2015-05-27 ENCOUNTER — Other Ambulatory Visit (INDEPENDENT_AMBULATORY_CARE_PROVIDER_SITE_OTHER): Payer: Federal, State, Local not specified - PPO

## 2015-05-27 VITALS — BP 134/86 | HR 89 | Temp 97.7°F | Resp 16 | Ht 68.0 in | Wt 158.8 lb

## 2015-05-27 DIAGNOSIS — G5793 Unspecified mononeuropathy of bilateral lower limbs: Secondary | ICD-10-CM

## 2015-05-27 DIAGNOSIS — E559 Vitamin D deficiency, unspecified: Secondary | ICD-10-CM | POA: Diagnosis not present

## 2015-05-27 DIAGNOSIS — Z1211 Encounter for screening for malignant neoplasm of colon: Secondary | ICD-10-CM | POA: Insufficient documentation

## 2015-05-27 DIAGNOSIS — G5791 Unspecified mononeuropathy of right lower limb: Secondary | ICD-10-CM

## 2015-05-27 DIAGNOSIS — E118 Type 2 diabetes mellitus with unspecified complications: Secondary | ICD-10-CM

## 2015-05-27 DIAGNOSIS — L301 Dyshidrosis [pompholyx]: Secondary | ICD-10-CM | POA: Insufficient documentation

## 2015-05-27 DIAGNOSIS — G5792 Unspecified mononeuropathy of left lower limb: Secondary | ICD-10-CM

## 2015-05-27 HISTORY — DX: Dyshidrosis (pompholyx): L30.1

## 2015-05-27 LAB — BASIC METABOLIC PANEL
BUN: 16 mg/dL (ref 6–23)
CHLORIDE: 104 meq/L (ref 96–112)
CO2: 29 mEq/L (ref 19–32)
Calcium: 9.7 mg/dL (ref 8.4–10.5)
Creatinine, Ser: 0.94 mg/dL (ref 0.40–1.50)
GFR: 87.56 mL/min (ref >60.00)
Glucose, Bld: 126 mg/dL — ABNORMAL HIGH (ref 70–99)
Potassium: 4.1 mEq/L (ref 3.5–5.1)
Sodium: 139 mEq/L (ref 135–145)

## 2015-05-27 LAB — HEMOGLOBIN A1C: Hgb A1c MFr Bld: 6.3 % (ref 4.6–6.5)

## 2015-05-27 LAB — VITAMIN D 25 HYDROXY (VIT D DEFICIENCY, FRACTURES): VITD: 23.06 ng/mL — ABNORMAL LOW (ref 30.00–100.00)

## 2015-05-27 MED ORDER — ERGOCALCIFEROL 1.25 MG (50000 UT) PO CAPS
50000.0000 [IU] | ORAL_CAPSULE | ORAL | Status: DC
Start: 2015-05-27 — End: 2017-04-05

## 2015-05-27 MED ORDER — CLOBETASOL PROPIONATE 0.05 % EX CREA: 1.0000 "application " | TOPICAL_CREAM | Freq: Two times a day (BID) | CUTANEOUS | Status: DC

## 2015-05-27 NOTE — Patient Instructions (Signed)
Eczema Eczema, also called atopic dermatitis, is a skin disorder that causes inflammation of the skin. It causes a red rash and dry, scaly skin. The skin becomes very itchy. Eczema is generally worse during the cooler winter months and often improves with the warmth of summer. Eczema usually starts showing signs in infancy. Some children outgrow eczema, but it may last through adulthood.  CAUSES  The exact cause of eczema is not known, but it appears to run in families. People with eczema often have a family history of eczema, allergies, asthma, or hay fever. Eczema is not contagious. Flare-ups of the condition may be caused by:   Contact with something you are sensitive or allergic to.   Stress. SIGNS AND SYMPTOMS  Dry, scaly skin.   Red, itchy rash.   Itchiness. This may occur before the skin rash and may be very intense.  DIAGNOSIS  The diagnosis of eczema is usually made based on symptoms and medical history. TREATMENT  Eczema cannot be cured, but symptoms usually can be controlled with treatment and other strategies. A treatment plan might include:  Controlling the itching and scratching.   Use over-the-counter antihistamines as directed for itching. This is especially useful at night when the itching tends to be worse.   Use over-the-counter steroid creams as directed for itching.   Avoid scratching. Scratching makes the rash and itching worse. It may also result in a skin infection (impetigo) due to a break in the skin caused by scratching.   Keeping the skin well moisturized with creams every day. This will seal in moisture and help prevent dryness. Lotions that contain alcohol and water should be avoided because they can dry the skin.   Limiting exposure to things that you are sensitive or allergic to (allergens).   Recognizing situations that cause stress.   Developing a plan to manage stress.  HOME CARE INSTRUCTIONS   Only take over-the-counter or  prescription medicines as directed by your health care provider.   Do not use anything on the skin without checking with your health care provider.   Keep baths or showers short (5 minutes) in warm (not hot) water. Use mild cleansers for bathing. These should be unscented. You may add nonperfumed bath oil to the bath water. It is best to avoid soap and bubble bath.   Immediately after a bath or shower, when the skin is still damp, apply a moisturizing ointment to the entire body. This ointment should be a petroleum ointment. This will seal in moisture and help prevent dryness. The thicker the ointment, the better. These should be unscented.   Keep fingernails cut short. Children with eczema may need to wear soft gloves or mittens at night after applying an ointment.   Dress in clothes made of cotton or cotton blends. Dress lightly, because heat increases itching.   A child with eczema should stay away from anyone with fever blisters or cold sores. The virus that causes fever blisters (herpes simplex) can cause a serious skin infection in children with eczema. SEEK MEDICAL CARE IF:   Your itching interferes with sleep.   Your rash gets worse or is not better within 1 week after starting treatment.   You see pus or soft yellow scabs in the rash area.   You have a fever.   You have a rash flare-up after contact with someone who has fever blisters.  Document Released: 11/13/2000 Document Revised: 09/06/2013 Document Reviewed: 06/19/2013 [employer] Patient Information 2015 [employer]. This information   is not intended to replace advice given to you by your health care provider. Make sure you discuss any questions you have with your health care provider.  

## 2015-05-27 NOTE — Progress Notes (Signed)
Pre visit review using our clinic review tool, if applicable. No additional management support is needed unless otherwise documented below in the visit note. 

## 2015-05-28 DIAGNOSIS — G5793 Unspecified mononeuropathy of bilateral lower limbs: Secondary | ICD-10-CM

## 2015-05-28 HISTORY — DX: Unspecified mononeuropathy of bilateral lower limbs: G57.93

## 2015-05-28 NOTE — Progress Notes (Signed)
Subjective:  Patient ID: Donald Jennings, male    DOB: [birth year]  Age: 59 y.o. MRN: [id number]  CC: Diabetes   HPI BIRCH FARINO presents for a f/up on DM2 and he has other complaints as well.  Outpatient Prescriptions Prior to Visit  Medication Sig Dispense Refill  . BENICAR 20 MG tablet TAKE 1 TABLET (20 MG TOTAL) BY MOUTH DAILY. FOR HIGH BLOOD PRESSURE 90 tablet 0  . diclofenac sodium (VOLTAREN) 1 % GEL Apply 2 g topically 4 (four) times daily. 1 Tube 5  . omeprazole (PRILOSEC OTC) 20 MG tablet Take 20 mg by mouth daily.    . varenicline (CHANTIX CONTINUING MONTH PAK) 1 MG tablet Take 1 tablet (1 mg total) by mouth 2 (two) times daily. 60 tablet 3  . varenicline (CHANTIX STARTING MONTH PAK) 0.5 MG X 11 & 1 MG X 42 tablet Take one 0.5 mg tablet by mouth once daily for 3 days, then increase to one 0.5 mg tablet twice daily for 4 days, then increase to one 1 mg tablet twice daily. 53 tablet 0  . triamcinolone (KENALOG) 0.025 % cream Apply 1 application topically 2 (two) times daily. 30 g 3  . Cholecalciferol 50000 UNITS TABS Take 1 tablet by mouth once a week. (Patient not taking: Reported on 05/27/2015) 12 tablet 3   No facility-administered medications prior to visit.    ROS Review of Systems  Constitutional: Negative.  Negative for fever, chills, diaphoresis, appetite change and fatigue.  HENT: Negative.  Negative for trouble swallowing and voice change.   Eyes: Negative.   Respiratory: Negative.  Negative for cough, choking, chest tightness, shortness of breath and stridor.   Cardiovascular: Negative.  Negative for chest pain, palpitations and leg swelling.  Gastrointestinal: Negative.  Negative for nausea, vomiting, abdominal pain, diarrhea, constipation and blood in stool.  Endocrine: Negative.   Genitourinary: Negative.  Negative for dysuria, frequency, hematuria, enuresis and difficulty urinating.  Musculoskeletal: Positive for arthralgias. Negative for myalgias, back pain, joint  swelling, gait problem, neck pain and neck stiffness.       He complains of stabbing pain in his right foot more so than left foot for several months with intermittent tingling  Skin: Positive for rash. Negative for color change, pallor and wound.       Chronic rash on hands and and feet, he saw dermatology was diagnosed with dermatitis, clobetasol has helped  Allergic/Immunologic: Negative.   Neurological: Negative.  Negative for dizziness, weakness, light-headedness, numbness and headaches.  Hematological: Negative.  Negative for adenopathy. Does not bruise/bleed easily.  Psychiatric/Behavioral: Negative.     Objective:  BP 134/86 mmHg  Pulse 89  Temp(Src) 97.7 F (36.5 C) (Oral)  Resp 16  Ht 5\' 8"  (1.727 m)  Wt 158 lb 12 oz (72.009 kg)  BMI 24.14 kg/m2  SpO2 98%  BP Readings from Last 3 Encounters:  05/27/15 134/86  03/27/15 136/72  01/16/15 112/68    Wt Readings from Last 3 Encounters:  05/27/15 158 lb 12 oz (72.009 kg)  03/27/15 160 lb (72.576 kg)  01/16/15 163 lb (73.936 kg)    Physical Exam  Constitutional: He is oriented to person, place, and time. He appears well-developed and well-nourished. No distress.  HENT:  Head: Normocephalic and atraumatic.  Mouth/Throat: Oropharynx is clear and moist. No oropharyngeal exudate.  Eyes: Conjunctivae are normal. Right eye exhibits no discharge. Left eye exhibits no discharge. No scleral icterus.  Neck: Normal range of motion. Neck supple. No JVD  present. No tracheal deviation present. No thyromegaly present.  Cardiovascular: Normal rate, regular rhythm, normal heart sounds and intact distal pulses.  Exam reveals no gallop and no friction rub.   No murmur heard. Pulses:      Carotid pulses are 1+ on the right side, and 1+ on the left side.      Radial pulses are 1+ on the right side, and 1+ on the left side.       Femoral pulses are 1+ on the right side, and 1+ on the left side.      Popliteal pulses are 1+ on the right  side, and 1+ on the left side.       Dorsalis pedis pulses are 1+ on the right side, and 1+ on the left side.       Posterior tibial pulses are 1+ on the right side, and 1+ on the left side.  Pulmonary/Chest: Effort normal and breath sounds normal. No stridor. No respiratory distress. He has no wheezes. He has no rales. He exhibits no tenderness.  Abdominal: Soft. Bowel sounds are normal. He exhibits no distension and no mass. There is no tenderness. There is no rebound and no guarding.  Musculoskeletal: Normal range of motion. He exhibits no edema or tenderness.       Right foot: Normal. There is normal range of motion, no tenderness, no bony tenderness, no swelling, normal capillary refill, no crepitus, no deformity and no laceration.       Left foot: Normal. There is normal range of motion, no tenderness, no bony tenderness, no swelling, normal capillary refill, no crepitus, no deformity and no laceration.  Lymphadenopathy:    He has no cervical adenopathy.  Neurological: He is alert and oriented to person, place, and time. He has normal strength. He displays no atrophy, no tremor and normal reflexes. No cranial nerve deficit or sensory deficit. He exhibits normal muscle tone. He displays a negative Romberg sign. He displays no seizure activity. Coordination and gait normal.  Reflex Scores:      Tricep reflexes are 1+ on the right side and 1+ on the left side.      Bicep reflexes are 1+ on the right side and 1+ on the left side.      Brachioradialis reflexes are 1+ on the right side and 1+ on the left side.      Patellar reflexes are 0 on the right side and 0 on the left side.      Achilles reflexes are 0 on the right side and 0 on the left side. Skin: Skin is warm and dry. No rash noted. He is not diaphoretic. No erythema. No pallor.  Psychiatric: He has a normal mood and affect. His behavior is normal. Judgment and thought content normal.  Vitals reviewed.   Lab Results  Component Value  Date   WBC 8.9 03/27/2015   HGB 13.3 03/27/2015   HCT 40.0 03/27/2015   PLT 228.0 03/27/2015   GLUCOSE 126* 05/27/2015   CHOL 208* 03/27/2015   TRIG 199.0* 03/27/2015   HDL 35.50* 03/27/2015   LDLDIRECT 167.4 03/21/2009   LDLCALC 133* 03/27/2015   ALT 15 03/27/2015   AST 17 03/27/2015   NA 139 05/27/2015   K 4.1 05/27/2015   CL 104 05/27/2015   CREATININE 0.94 05/27/2015   BUN 16 05/27/2015   CO2 29 05/27/2015   TSH 1.99 03/27/2015   PSA 0.62 04/04/2013   HGBA1C 6.3 05/27/2015   MICROALBUR 1.8 03/27/2015  Dg Shoulder Right  07/02/2010   Clinical Data: Right shoulder pain for 10-12 days, no acute injury   RIGHT SHOULDER - 2+ VIEW   Comparison: None.   Findings: The right humeral head appears to move normally within the glenohumeral joint space.  No significant degenerative change is seen and the right [facility] joint is normally aligned.  The acromion is slightly downward sloping which can predispose to impingement. The ribs that are visualized.   IMPRESSION: Negative right shoulder.  Downward sloping acromion can predispose to impingement.  Provider: Lelon Huh   Assessment & Plan:   Danil was seen today for diabetes.  Diagnoses and all orders for this visit:  Vitamin D deficiency - his Vit D level is low, will restart Vit D supplement Orders: -     Vit D  25 hydroxy (rtn osteoporosis monitoring); Future -     ergocalciferol (DRISDOL) 50000 UNITS capsule; Take 1 capsule (50,000 Units total) by mouth once a week.  Type 2 diabetes mellitus with manifestations - his blood sugars are well controlled Orders: -     Basic metabolic panel; Future -     Hemoglobin A1c; Future  Eczema, dyshidrotic Orders: -     clobetasol cream (TEMOVATE) 0.05 %; Apply 1 application topically 2 (two) times daily.  Screening for colon cancer Orders: -     Ambulatory referral to Gastroenterology  Neuropathy of both feet - will get a NCS/EMG done to confirm that this is the cause for his foot  pain Orders: -     Ambulatory referral to Neurology   I have discontinued Mr. Ureta triamcinolone. I have also changed his Cholecalciferol to ergocalciferol. Additionally, I am having him start on clobetasol cream. Lastly, I am having him maintain his omeprazole, diclofenac sodium, varenicline, varenicline, and BENICAR.  Meds ordered this encounter  Medications  . clobetasol cream (TEMOVATE) 0.05 %    Sig: Apply 1 application topically 2 (two) times daily.    Dispense:  60 g    Refill:  2  . ergocalciferol (DRISDOL) 50000 UNITS capsule    Sig: Take 1 capsule (50,000 Units total) by mouth once a week.    Dispense:  4 capsule    Refill:  11     Follow-up: Return in about 4 months (around 09/26/2015).  Scarlette Calico, MD

## 2015-05-31 ENCOUNTER — Other Ambulatory Visit: Payer: Self-pay | Admitting: *Deleted

## 2015-05-31 DIAGNOSIS — G609 Hereditary and idiopathic neuropathy, unspecified: Secondary | ICD-10-CM

## 2015-06-11 ENCOUNTER — Ambulatory Visit (INDEPENDENT_AMBULATORY_CARE_PROVIDER_SITE_OTHER): Payer: Federal, State, Local not specified - PPO | Admitting: Neurology

## 2015-06-11 ENCOUNTER — Encounter: Payer: Self-pay | Admitting: Internal Medicine

## 2015-06-11 DIAGNOSIS — G609 Hereditary and idiopathic neuropathy, unspecified: Secondary | ICD-10-CM | POA: Diagnosis not present

## 2015-06-11 DIAGNOSIS — R202 Paresthesia of skin: Secondary | ICD-10-CM

## 2015-06-11 NOTE — Procedures (Signed)
[facility]  [address]  [place], [place] [address] Tel: [phone] Fax:  [phone] Test Date:  06/11/2015  Patient: Donald Jennings DOB: 1956/04/28 Physician: Narda Amber  Sex: Male Height: 5\' 7"  Ref Phys: Scarlette Calico, M.D.  ID#: [id number] Temp: 33.7C Technician: Jerilynn Mages. Dean   Patient Complaints: This is a 59 year old gentleman presenting for evaluation of bilateral feet paresthesias.  NCV & EMG Findings: Extensive electrodiagnostic testing of the right lower extremity and additional studies of the left shows:  1. Bilateral sural and superficial peroneal sensory responses are within normal limits. 2. Bilateral peroneal and tibial motor responses are within normal limits. 3. There is no evidence of active or chronic motor axon loss changes affecting any of the tested muscles. Motor unit configuration and recruitment pattern is within normal limits.  Impression: This is a normal study of the lower extremities. In particular, there is no evidence of a generalized sensorimotor polyneuropathy or lumbosacral radiculopathy.   ___________________________ Narda Amber    Nerve Conduction Studies Anti Sensory Summary Table   Stim Site NR Peak (ms) Norm Peak (ms) P-T Amp (V) Norm P-T Amp  Left Sup Peroneal Anti Sensory (Ant Lat Mall)  33.7C  12 cm    3.1 <4.6 8.2 >4  Right Sup Peroneal Anti Sensory (Ant Lat Mall)  33.7C  12 cm    2.9 <4.6 11.7 >4  Left Sural Anti Sensory (Lat Mall)  33.7C  Calf    3.7 <4.6 7.7 >4  Right Sural Anti Sensory (Lat Mall)  33.7C  Calf    3.4 <4.6 11.6 >4   Motor Summary Table   Stim Site NR Onset (ms) Norm Onset (ms) O-P Amp (mV) Norm O-P Amp Site1 Site2 Delta-0 (ms) Dist (cm) Vel (m/s) Norm Vel (m/s)  Left Peroneal Motor (Ext Dig Brev)  33.7C  Ankle    4.0 <6.0 4.3 >2.5 B Fib Ankle 6.9 31.0 45 >40  B Fib    10.9  4.2  Poplt B Fib 2.1 10.0 48 >40  Poplt    13.0  4.1         Right Peroneal Motor (Ext Dig Brev)  33.7C    Ankle    3.8 <6.0 4.9 >2.5 B Fib Ankle 6.9 32.0 46 >40  B Fib    10.7  4.5  Poplt B Fib 2.0 10.0 50 >40  Poplt    12.7  4.4         Left Tibial Motor (Abd Hall Brev)  33.7C  Ankle    3.5 <6.0 7.3 >4 Knee Ankle 10.4 44.0 42 >40  Knee    13.9  4.8         Right Tibial Motor (Abd Hall Brev)  33.7C  Ankle    3.1 <6.0 5.0 >4 Knee Ankle 10.3 42.0 41 >40  Knee    13.4  2.8          H Reflex Studies   NR H-Lat (ms) Lat Norm (ms) L-R H-Lat (ms)  Right Tibial (Gastroc)  33.7C     32.52 <35 0.41   EMG   Side Muscle Ins Act Fibs Psw Fasc Number Recrt Dur Dur. Amp Amp. Poly Poly. Comment  Right RectFemoris Nml Nml Nml Nml Nml Nml Nml Nml Nml Nml Nml Nml N/A  Right AntTibialis Nml Nml Nml Nml Nml Nml Nml Nml Nml Nml Nml Nml N/A  Right Gastroc Nml Nml Nml Nml Nml Nml Nml Nml Nml Nml Nml Nml N/A  Right Flex Dig Long Nml Nml Nml Nml Nml Nml Nml Nml Nml Nml Nml Nml N/A  Right GluteusMed Nml Nml Nml Nml Nml Nml Nml Nml Nml Nml Nml Nml N/A  Right BicepsFemS Nml Nml Nml Nml Nml Nml Nml Nml Nml Nml Nml Nml N/A  Left AntTibialis Nml Nml Nml Nml Nml Nml Nml Nml Nml Nml Nml Nml N/A  Left BicepsFemS Nml Nml Nml Nml Nml Nml Nml Nml Nml Nml Nml Nml N/A  Left Gastroc Nml Nml Nml Nml Nml Nml Nml Nml Nml Nml Nml Nml N/A      Waveforms:

## 2015-06-13 ENCOUNTER — Telehealth: Payer: Self-pay | Admitting: Internal Medicine

## 2015-06-13 NOTE — Telephone Encounter (Signed)
I have not personally made any phone calls out. Thanks

## 2015-06-13 NOTE — Telephone Encounter (Signed)
Patient states he had a missed call and can not get into his voicemail.  He would like to know if it was you who called.

## 2015-06-14 NOTE — Telephone Encounter (Signed)
Notified patient.

## 2015-07-21 ENCOUNTER — Other Ambulatory Visit: Payer: Self-pay | Admitting: Family

## 2015-07-25 HISTORY — PX: COLONOSCOPY: SHX174

## 2015-07-25 LAB — HM COLONOSCOPY: HM Colonoscopy: NORMAL

## 2015-07-26 LAB — HM COLONOSCOPY: HM Colonoscopy: NORMAL

## 2015-07-29 ENCOUNTER — Encounter: Payer: Self-pay | Admitting: Internal Medicine

## 2015-08-13 ENCOUNTER — Ambulatory Visit (INDEPENDENT_AMBULATORY_CARE_PROVIDER_SITE_OTHER)
Admission: RE | Admit: 2015-08-13 | Discharge: 2015-08-13 | Disposition: A | Discharge status: Home / Self Care | Payer: Federal, State, Local not specified - PPO | Source: Ambulatory Visit | Attending: Internal Medicine | Admitting: Internal Medicine

## 2015-08-13 ENCOUNTER — Ambulatory Visit (INDEPENDENT_AMBULATORY_CARE_PROVIDER_SITE_OTHER): Payer: Federal, State, Local not specified - PPO | Admitting: Internal Medicine

## 2015-08-13 ENCOUNTER — Encounter: Payer: Self-pay | Admitting: Internal Medicine

## 2015-08-13 ENCOUNTER — Telehealth: Payer: Self-pay | Admitting: Internal Medicine

## 2015-08-13 VITALS — BP 128/78 | HR 106 | Temp 98.2°F | Resp 16 | Ht 68.0 in | Wt 161.0 lb

## 2015-08-13 DIAGNOSIS — R05 Cough: Secondary | ICD-10-CM

## 2015-08-13 DIAGNOSIS — M791 Myalgia, unspecified site: Secondary | ICD-10-CM | POA: Insufficient documentation

## 2015-08-13 DIAGNOSIS — R059 Cough, unspecified: Secondary | ICD-10-CM

## 2015-08-13 DIAGNOSIS — F17200 Nicotine dependence, unspecified, uncomplicated: Secondary | ICD-10-CM

## 2015-08-13 DIAGNOSIS — Z720 Tobacco use: Secondary | ICD-10-CM | POA: Diagnosis not present

## 2015-08-13 NOTE — Patient Instructions (Addendum)
We will check a chest x-ray to make sure that the cough is not caused by any problems in the lungs.   Consider getting the chantix to help you stop smoking. This is one of the best things you can do for your health.   You can keep using the alleve or ibuprofen for the pain in the muscles, if you need additional medication in between it is safe to also use tylenol.   Smoking Cessation Quitting smoking is important to your health and has many advantages. However, it is not always easy to quit since nicotine is a very addictive drug. Oftentimes, people try 3 times or more before being able to quit. This document explains the best ways for you to prepare to quit smoking. Quitting takes hard work and a lot of effort, but you can do it. ADVANTAGES OF QUITTING SMOKING  You will live longer, feel better, and live better.  Your body will feel the impact of quitting smoking almost immediately.  Within 20 minutes, blood pressure decreases. Your pulse returns to its normal level.  After 8 hours, carbon monoxide levels in the blood return to normal. Your oxygen level increases.  After 24 hours, the chance of having a heart attack starts to decrease. Your breath, hair, and body stop smelling like smoke.  After 48 hours, damaged nerve endings begin to recover. Your sense of taste and smell improve.  After 72 hours, the body is virtually free of nicotine. Your bronchial tubes relax and breathing becomes easier.  After 2 to 12 weeks, lungs can hold more air. Exercise becomes easier and circulation improves.  The risk of having a heart attack, stroke, cancer, or lung disease is greatly reduced.  After 1 year, the risk of coronary heart disease is cut in half.  After 5 years, the risk of stroke falls to the same as a nonsmoker.  After 10 years, the risk of lung cancer is cut in half and the risk of other cancers decreases significantly.  After 15 years, the risk of coronary heart disease drops,  usually to the level of a nonsmoker.  If you are pregnant, quitting smoking will improve your chances of having a healthy baby.  The people you live with, especially any children, will be healthier.  You will have extra money to spend on things other than cigarettes. QUESTIONS TO THINK ABOUT BEFORE ATTEMPTING TO QUIT You may want to talk about your answers with your health care provider.  Why do you want to quit?  If you tried to quit in the past, what helped and what did not?  What will be the most difficult situations for you after you quit? How will you plan to handle them?  Who can help you through the tough times? Your family? Friends? A health care provider?  What pleasures do you get from smoking? What ways can you still get pleasure if you quit? Here are some questions to ask your health care provider:  How can you help me to be successful at quitting?  What medicine do you think would be best for me and how should I take it?  What should I do if I need more help?  What is smoking withdrawal like? How can I get information on withdrawal? GET READY  Set a quit date.  Change your environment by getting rid of all cigarettes, ashtrays, matches, and lighters in your home, car, or work. Do not let people smoke in your home.  Review your past  attempts to quit. Think about what worked and what did not. GET SUPPORT AND ENCOURAGEMENT You have a better chance of being successful if you have help. You can get support in many ways.  Tell your family, friends, and coworkers that you are going to quit and need their support. Ask them not to smoke around you.  Get individual, group, or telephone counseling and support. Programs are available at [employer] and health centers. Call your local health department for information about programs in your area.  Spiritual beliefs and practices may help some smokers quit.  Download a "quit meter" on your computer to keep track of quit  statistics, such as how long you have gone without smoking, cigarettes not smoked, and money saved.  Get a self-help book about quitting smoking and staying off tobacco. [place] yourself from urges to smoke. Talk to someone, go for a walk, or occupy your time with a task.  Change your normal routine. Take a different route to work. Drink tea instead of coffee. Eat breakfast in a different place.  Reduce your stress. Take a hot bath, exercise, or read a book.  Plan something enjoyable to do every day. Reward yourself for not smoking.  Explore interactive web-based programs that specialize in helping you quit. GET MEDICINE AND USE IT CORRECTLY Medicines can help you stop smoking and decrease the urge to smoke. Combining medicine with the above behavioral methods and support can greatly increase your chances of successfully quitting smoking.  Nicotine replacement therapy helps deliver nicotine to your body without the negative effects and risks of smoking. Nicotine replacement therapy includes nicotine gum, lozenges, inhalers, nasal sprays, and skin patches. Some may be available over-the-counter and others require a prescription.  Antidepressant medicine helps people abstain from smoking, but how this works is unknown. This medicine is available by prescription.  Nicotinic receptor partial agonist medicine simulates the effect of nicotine in your brain. This medicine is available by prescription. Ask your health care provider for advice about which medicines to use and how to use them based on your health history. Your health care provider will tell you what side effects to look out for if you choose to be on a medicine or therapy. Carefully read the information on the package. Do not use any other product containing nicotine while using a nicotine replacement product.  RELAPSE OR DIFFICULT SITUATIONS Most relapses occur within the first 3 months after quitting.  Do not be discouraged if you start smoking again. Remember, most people try several times before finally quitting. You may have symptoms of withdrawal because your body is used to nicotine. You may crave cigarettes, be irritable, feel very hungry, cough often, get headaches, or have difficulty concentrating. The withdrawal symptoms are only temporary. They are strongest when you first quit, but they will go away within 10-14 days. To reduce the chances of relapse, try to:  Avoid drinking alcohol. Drinking lowers your chances of successfully quitting.  Reduce the amount of caffeine you consume. Once you quit smoking, the amount of caffeine in your body increases and can give you symptoms, such as a rapid heartbeat, sweating, and anxiety.  Avoid smokers because they can make you want to smoke.  Do not let weight gain distract you. Many smokers will gain weight when they quit, usually less than 10 pounds. Eat a healthy diet and stay active. You can always lose the weight gained after you quit.  Find ways to improve  your mood other than smoking. FOR MORE INFORMATION  www.smokefree.gov  Document Released: 11/10/2001 Document Revised: 04/02/2014 Document Reviewed: 02/25/2012 [facility] Patient Information 2015 [place], [place]. This information is not intended to replace advice given to you by your health care provider. Make sure you discuss any questions you have with your health care provider.

## 2015-08-13 NOTE — Progress Notes (Signed)
   Subjective:    Patient ID: Donald Jennings, male    DOB: [birth year], 59 y.o.   MRN: [id number]  HPI The patient is a 59 YO man coming in for upper back pain associated with increased coughing the last 2 weeks. No travel in the last 1-2 months. No pain with deep breathing. Pain is constant and not associated with exercise. Worse with twisting. Took some ibuprofen this morning and it helped decrease the pain about 50%. Overall the pain is improving gradually. He is still smoking and wants to quit. Has not tried the chantix but would like to do so. Denies sputum production or SOB. Stress test negative last year.   Review of Systems  Constitutional: Negative for fever, activity change, appetite change, fatigue and unexpected weight change.  Respiratory: Positive for cough. Negative for chest tightness, shortness of breath and wheezing.   Cardiovascular: Negative for chest pain, palpitations and leg swelling.  Gastrointestinal: Negative for nausea, abdominal pain, diarrhea, constipation and abdominal distention.  Musculoskeletal: Positive for myalgias and back pain. Negative for arthralgias and gait problem.  Skin: Negative.       Objective:   Physical Exam  Constitutional: He is oriented to person, place, and time. He appears well-developed and well-nourished.  HENT:  Head: Normocephalic and atraumatic.  Eyes: EOM are normal.  Neck: Normal range of motion.  Cardiovascular: Normal rate and regular rhythm.   No murmur heard. Pulmonary/Chest: Effort normal and breath sounds normal. He has no rales. He exhibits no tenderness.  Some coarse sounds in the right lung which clear with cough  Abdominal: Soft. Bowel sounds are normal. He exhibits no distension. There is no tenderness.  Musculoskeletal: He exhibits tenderness.  Tenderness in the scapular region right side.   Neurological: He is alert and oriented to person, place, and time.  Skin: Skin is warm and dry.   Filed Vitals:   08/13/15 1423    BP: 128/78  Pulse: 106  Temp: 98.2 F (36.8 C)  TempSrc: Oral  Resp: 16  Height: 5\' 8"  (1.727 m)  Weight: 161 lb (73.029 kg)  SpO2: 95%      Assessment & Plan:

## 2015-08-13 NOTE — Assessment & Plan Note (Signed)
Appears to be muscle related pain and can continue to take NSAIDs for pain. Checking CXR given the coarse sounds on exam and current smoker.

## 2015-08-13 NOTE — Telephone Encounter (Signed)
Patient Name: Donald Jennings DOB: 01/06/1956 Initial Comment Caller states, has an appt at 230pm today, For 3-4 days he had chest pains, then yesterday he has severe back pains too. Today he had pains again, and took ibuprofen. Nurse Assessment Nurse: Ronnald Ramp, RN, Miranda Date/Time (Eastern Time): 08/13/2015 9:53:07 AM Confirm and document reason for call. If symptomatic, describe symptoms. ---Caller states he has a chronic cough for awhile and chest pain for 3-4 days. He states the pain is in the left side of chest and hurts more when he pushes on the area. Since yesterday, he has also had back pain. Has the patient traveled out of the country within the last 30 days? ---No Does the patient require triage? ---Yes Related visit to physician within the last 2 weeks? ---No Does the PT have any chronic conditions? (i.e. diabetes, asthma, etc.) ---Yes List chronic conditions. ---GERD, HTN Guidelines Guideline Title Affirmed Question Affirmed Notes Chest Injury - Bending Lifting or Twisting [1] MODERATE pain (e.g., interferes with normal activities) AND [2] present > 3 days Final Disposition User See PCP When Office is Open (within 3 days) Ronnald Ramp, RN, [employer] already has an appt set up for 2:30 pm today with Dr. Doug Sou. Disagree/Comply: Comply

## 2015-08-13 NOTE — Assessment & Plan Note (Signed)
Counseled about smoking cessation and he would like to quit. Has not picked up chantix and recommended that he do that and continue for 3 months. Advised he can quit anytime in the first 1 month.

## 2015-12-18 LAB — HM DIABETES EYE EXAM

## 2016-02-05 ENCOUNTER — Other Ambulatory Visit: Payer: Self-pay | Admitting: Internal Medicine

## 2016-03-04 DIAGNOSIS — H40013 Open angle with borderline findings, low risk, bilateral: Secondary | ICD-10-CM | POA: Diagnosis not present

## 2016-03-04 DIAGNOSIS — H04121 Dry eye syndrome of right lacrimal gland: Secondary | ICD-10-CM | POA: Diagnosis not present

## 2016-03-04 DIAGNOSIS — H02514 Abnormal innervation syndrome left upper eyelid: Secondary | ICD-10-CM | POA: Diagnosis not present

## 2016-03-06 DIAGNOSIS — L401 Generalized pustular psoriasis: Secondary | ICD-10-CM | POA: Diagnosis not present

## 2016-03-06 DIAGNOSIS — L403 Pustulosis palmaris et plantaris: Secondary | ICD-10-CM | POA: Diagnosis not present

## 2016-03-06 DIAGNOSIS — Z79899 Other long term (current) drug therapy: Secondary | ICD-10-CM | POA: Diagnosis not present

## 2016-05-14 ENCOUNTER — Other Ambulatory Visit: Payer: Self-pay | Admitting: Internal Medicine

## 2016-05-18 ENCOUNTER — Ambulatory Visit (INDEPENDENT_AMBULATORY_CARE_PROVIDER_SITE_OTHER)
Admission: RE | Admit: 2016-05-18 | Discharge: 2016-05-18 | Disposition: A | Discharge status: Home / Self Care | Payer: Federal, State, Local not specified - PPO | Source: Ambulatory Visit | Attending: Internal Medicine | Admitting: Internal Medicine

## 2016-05-18 ENCOUNTER — Other Ambulatory Visit (INDEPENDENT_AMBULATORY_CARE_PROVIDER_SITE_OTHER): Payer: Federal, State, Local not specified - PPO

## 2016-05-18 ENCOUNTER — Other Ambulatory Visit: Payer: Self-pay | Admitting: Internal Medicine

## 2016-05-18 ENCOUNTER — Encounter: Payer: Self-pay | Admitting: Internal Medicine

## 2016-05-18 ENCOUNTER — Other Ambulatory Visit: Payer: Self-pay | Admitting: Family

## 2016-05-18 ENCOUNTER — Ambulatory Visit (INDEPENDENT_AMBULATORY_CARE_PROVIDER_SITE_OTHER): Payer: Federal, State, Local not specified - PPO | Admitting: Internal Medicine

## 2016-05-18 VITALS — BP 138/78 | HR 92 | Temp 98.7°F | Resp 16 | Ht 68.0 in | Wt 162.0 lb

## 2016-05-18 DIAGNOSIS — E118 Type 2 diabetes mellitus with unspecified complications: Secondary | ICD-10-CM | POA: Diagnosis not present

## 2016-05-18 DIAGNOSIS — R0609 Other forms of dyspnea: Secondary | ICD-10-CM | POA: Diagnosis not present

## 2016-05-18 DIAGNOSIS — R0600 Dyspnea, unspecified: Secondary | ICD-10-CM

## 2016-05-18 DIAGNOSIS — R05 Cough: Secondary | ICD-10-CM

## 2016-05-18 DIAGNOSIS — Z1159 Encounter for screening for other viral diseases: Secondary | ICD-10-CM | POA: Diagnosis not present

## 2016-05-18 DIAGNOSIS — F17200 Nicotine dependence, unspecified, uncomplicated: Secondary | ICD-10-CM

## 2016-05-18 DIAGNOSIS — R9431 Abnormal electrocardiogram [ECG] [EKG]: Secondary | ICD-10-CM

## 2016-05-18 DIAGNOSIS — R5383 Other fatigue: Secondary | ICD-10-CM

## 2016-05-18 DIAGNOSIS — I10 Essential (primary) hypertension: Secondary | ICD-10-CM

## 2016-05-18 DIAGNOSIS — R059 Cough, unspecified: Secondary | ICD-10-CM | POA: Insufficient documentation

## 2016-05-18 DIAGNOSIS — E559 Vitamin D deficiency, unspecified: Secondary | ICD-10-CM | POA: Diagnosis not present

## 2016-05-18 DIAGNOSIS — R1310 Dysphagia, unspecified: Secondary | ICD-10-CM | POA: Insufficient documentation

## 2016-05-18 DIAGNOSIS — Z0000 Encounter for general adult medical examination without abnormal findings: Secondary | ICD-10-CM | POA: Diagnosis not present

## 2016-05-18 DIAGNOSIS — J988 Other specified respiratory disorders: Secondary | ICD-10-CM

## 2016-05-18 DIAGNOSIS — J22 Unspecified acute lower respiratory infection: Secondary | ICD-10-CM | POA: Insufficient documentation

## 2016-05-18 DIAGNOSIS — M7541 Impingement syndrome of right shoulder: Secondary | ICD-10-CM | POA: Diagnosis not present

## 2016-05-18 HISTORY — DX: Dysphagia, unspecified: R13.10

## 2016-05-18 LAB — CARDIAC PANEL
CK MB: 5 ng/mL — AB (ref 0.3–4.0)
RELATIVE INDEX: 2.9 calc — AB (ref 0.0–2.5)
Total CK: 169 U/L (ref 7–232)

## 2016-05-18 LAB — CBC WITH DIFFERENTIAL/PLATELET
BASOS ABS: 0 10*3/uL (ref 0.0–0.1)
Basophils Relative: 0 % (ref 0.0–3.0)
EOS ABS: 0 10*3/uL (ref 0.0–0.7)
Eosinophils Relative: 0.1 % (ref 0.0–5.0)
HCT: 44.2 % (ref 39.0–52.0)
Hemoglobin: 14.4 g/dL (ref 13.0–17.0)
LYMPHS ABS: 0.7 10*3/uL (ref 0.7–4.0)
Lymphocytes Relative: 7.9 % — ABNORMAL LOW (ref 12.0–46.0)
MCHC: 32.7 g/dL (ref 30.0–36.0)
MCV: 82.9 fl (ref 78.0–100.0)
MONO ABS: 0 10*3/uL — AB (ref 0.1–1.0)
Monocytes Relative: 0.3 % — ABNORMAL LOW (ref 3.0–12.0)
NEUTROS ABS: 8.2 10*3/uL — AB (ref 1.4–7.7)
PLATELETS: 297 10*3/uL (ref 150.0–400.0)
RBC: 5.33 Mil/uL (ref 4.22–5.81)
RDW: 14.4 % (ref 11.5–15.5)
WBC: 8.9 10*3/uL (ref 4.0–10.5)

## 2016-05-18 LAB — COMPREHENSIVE METABOLIC PANEL
ALK PHOS: 68 U/L (ref 39–117)
ALT: 17 U/L (ref 0–53)
AST: 18 U/L (ref 0–37)
Albumin: 4.8 g/dL (ref 3.5–5.2)
BILIRUBIN TOTAL: 0.3 mg/dL (ref 0.2–1.2)
BUN: 20 mg/dL (ref 6–23)
CO2: 24 meq/L (ref 19–32)
CREATININE: 0.92 mg/dL (ref 0.40–1.50)
Calcium: 9.8 mg/dL (ref 8.4–10.5)
Chloride: 102 mEq/L (ref 96–112)
GFR: 89.45 mL/min (ref >60.00)
GLUCOSE: 141 mg/dL — AB (ref 70–99)
Potassium: 4.8 mEq/L (ref 3.5–5.1)
Sodium: 136 mEq/L (ref 135–145)
TOTAL PROTEIN: 8 g/dL (ref 6.0–8.3)

## 2016-05-18 LAB — MICROALBUMIN / CREATININE URINE RATIO
CREATININE, U: 234.7 mg/dL
MICROALB UR: 8.7 mg/dL — AB (ref 0.0–1.9)
Microalb Creat Ratio: 3.7 mg/g (ref 0.0–30.0)

## 2016-05-18 LAB — URINALYSIS, ROUTINE W REFLEX MICROSCOPIC
BILIRUBIN URINE: NEGATIVE
HGB URINE DIPSTICK: NEGATIVE
Ketones, ur: NEGATIVE
Leukocytes, UA: NEGATIVE
Nitrite: NEGATIVE
RBC / HPF: NONE SEEN (ref 0–?)
SPECIFIC GRAVITY, URINE: 1.025 (ref 1.000–1.030)
Total Protein, Urine: 30 — AB
Urine Glucose: NEGATIVE
Urobilinogen, UA: 0.2 (ref 0.0–1.0)
WBC, UA: NONE SEEN (ref 0–?)
pH: 6 (ref 5.0–8.0)

## 2016-05-18 LAB — LIPID PANEL
CHOL/HDL RATIO: 6
CHOLESTEROL: 243 mg/dL — AB (ref 0–200)
HDL: 39.1 mg/dL (ref >39.00)
LDL CALC: 173 mg/dL — AB (ref 0–99)
NONHDL: 203.67
Triglycerides: 151 mg/dL — ABNORMAL HIGH (ref 0.0–149.0)
VLDL: 30.2 mg/dL (ref 0.0–40.0)

## 2016-05-18 LAB — HIV ANTIBODY (ROUTINE TESTING W REFLEX): HIV 1&2 Ab, 4th Generation: NONREACTIVE

## 2016-05-18 LAB — TROPONIN I: TNIDX: 0.02 ug/L (ref 0.00–0.06)

## 2016-05-18 LAB — POCT GLYCOSYLATED HEMOGLOBIN (HGB A1C): HEMOGLOBIN A1C: 6.8

## 2016-05-18 LAB — HEPATITIS C ANTIBODY: HCV AB: NEGATIVE

## 2016-05-18 LAB — FECAL OCCULT BLOOD, GUAIAC: Fecal Occult Blood: NEGATIVE

## 2016-05-18 MED ORDER — AMOXICILLIN-POT CLAVULANATE 875-125 MG PO TABS: 1.0000 | ORAL_TABLET | Freq: Two times a day (BID) | ORAL | Status: AC

## 2016-05-18 MED ORDER — METOPROLOL SUCCINATE ER 25 MG PO TB24: 25.0000 mg | ORAL_TABLET | Freq: Every day | ORAL | Status: DC

## 2016-05-18 MED ORDER — LOSARTAN POTASSIUM 100 MG PO TABS: 100.0000 mg | ORAL_TABLET | Freq: Every day | ORAL | Status: DC

## 2016-05-18 NOTE — Progress Notes (Signed)
Pre visit review using our clinic review tool, if applicable. No additional management support is needed unless otherwise documented below in the visit note. 

## 2016-05-18 NOTE — Patient Instructions (Signed)

## 2016-05-18 NOTE — Progress Notes (Signed)
Subjective:  Patient ID: Donald Jennings, male    DOB: 1956/04/22  Age: 59 y.o. MRN: [id number]  CC: Cough; Diabetes; Hypertension; Hyperlipidemia; Annual Exam; and Gastroesophageal Reflux   HPI [facility] presents for a CPX.  He complains of a three-week history of cough productive of thick yellow phlegm.  He has had a few chills but denies fever, hemoptysis, or night sweats. He also tells me over the last year he has had worsening shortness of breath and dyspnea on exertion. He says he can workout at the gym without experiencing any DOE but he says when he walks thru his backyard which is on an incline it causes some DOE. He has had no palpitations, or edema. He has been out of his antihypertensive meds for several weeks and has been taking his wife's supply of metoprolol and losartan. He wants refills on these 2 medications.  He continues to smoke.  He also complains of a few episodes of dysphagia and odynophagia with liquids. He has a history of GERD that is treated with a proton pump inhibitor.   he complains of fatigue and wants to have his vitamin D and testosterone levels checked.  Outpatient Prescriptions Prior to Visit  Medication Sig Dispense Refill  . ergocalciferol (DRISDOL) 50000 UNITS capsule Take 1 capsule (50,000 Units total) by mouth once a week. 4 capsule 11  . omeprazole (PRILOSEC OTC) 20 MG tablet Take 20 mg by mouth daily.    . clobetasol cream (TEMOVATE) [id number] % Apply 1 application topically 2 (two) times daily. 60 g 2  . diclofenac sodium (VOLTAREN) 1 % GEL Apply 2 g topically 4 (four) times daily. 1 Tube 5  . triamcinolone (KENALOG) 0.025 % cream Reported on 05/18/2016    . ZYLET 0.5-0.3 % SUSP Reported on 05/18/2016  0  . olmesartan (BENICAR) 20 MG tablet     . olmesartan (BENICAR) 20 MG tablet Take 1 tablet (20 mg total) by mouth daily. for high blood pressure Yearly physical is due must see MD for refills (Patient not taking: Reported on 05/18/2016) 90 tablet 0  .  varenicline (CHANTIX CONTINUING MONTH PAK) 1 MG tablet Take 1 tablet (1 mg total) by mouth 2 (two) times daily. (Patient not taking: Reported on 08/13/2015) 60 tablet 3  . varenicline (CHANTIX STARTING MONTH PAK) 0.5 MG X 11 & 1 MG X 42 tablet Take one 0.5 mg tablet by mouth once daily for 3 days, then increase to one 0.5 mg tablet twice daily for 4 days, then increase to one 1 mg tablet twice daily. (Patient not taking: Reported on 08/13/2015) 53 tablet 0   No facility-administered medications prior to visit.    ROS Review of Systems  Constitutional: Positive for chills and fatigue. Negative for fever, diaphoresis, activity change, appetite change and unexpected weight change.  HENT: Positive for trouble swallowing. Negative for congestion, facial swelling, postnasal drip, sinus pressure, sore throat and voice change.   Eyes: Negative.  Negative for visual disturbance.  Respiratory: Positive for cough and shortness of breath (DOE). Negative for apnea, choking, chest tightness, wheezing and stridor.   Cardiovascular: Negative.  Negative for chest pain, palpitations and leg swelling.  Gastrointestinal: Negative.  Negative for nausea, vomiting, abdominal pain, diarrhea and constipation.  Endocrine: Negative.  Negative for polydipsia, polyphagia and polyuria.  Genitourinary: Negative.  Negative for dysuria, urgency, scrotal swelling, difficulty urinating and testicular pain.  Musculoskeletal: Negative.  Negative for myalgias, back pain, joint swelling, arthralgias and neck pain.  Skin: Negative.  Negative for pallor and rash.  Allergic/Immunologic: Negative.   Neurological: Negative.  Negative for dizziness, tremors, light-headedness, numbness and headaches.  Hematological: Negative.  Negative for adenopathy. Does not bruise/bleed easily.  Psychiatric/Behavioral: Negative.     Objective:  BP 138/78 mmHg  Pulse 92  Temp(Src) 98.7 F (37.1 C) (Oral)  Resp 16  Ht 5\' 8"  (1.727 m)  Wt 162 lb  (73.483 kg)  BMI 24.64 kg/m2  SpO2 98%  BP Readings from Last 3 Encounters:  05/18/16 138/78  08/13/15 128/78  05/27/15 134/86    Wt Readings from Last 3 Encounters:  05/18/16 162 lb (73.483 kg)  08/13/15 161 lb (73.029 kg)  05/27/15 158 lb 12 oz (72.009 kg)    Physical Exam  Constitutional: He is oriented to person, place, and time. No distress.  HENT:  Mouth/Throat: Oropharynx is clear and moist.  Eyes: Conjunctivae are normal. Right eye exhibits no discharge. Left eye exhibits no discharge. No scleral icterus.  Neck: Normal range of motion. Neck supple. No JVD present. No tracheal deviation present. No thyromegaly present.  Cardiovascular: Normal rate, regular rhythm, normal heart sounds and intact distal pulses.  Exam reveals no gallop and no friction rub.   No murmur heard. Pulses:      Carotid pulses are 1+ on the right side, and 1+ on the left side.      Radial pulses are 1+ on the right side, and 1+ on the left side.       Femoral pulses are 1+ on the right side, and 1+ on the left side.      Popliteal pulses are 1+ on the right side, and 1+ on the left side.       Dorsalis pedis pulses are 1+ on the right side, and 1+ on the left side.       Posterior tibial pulses are 1+ on the right side, and 1+ on the left side.  EKG ----  Sinus  Rhythm  -Old anterior infarct   possible septal Q-waves.   ABNORMAL - these changes are new  Pulmonary/Chest: Effort normal. No accessory muscle usage or stridor. No tachypnea. No respiratory distress. He has no decreased breath sounds. He has no wheezes. He has rhonchi in the right middle field and the left middle field. He has no rales. He exhibits no tenderness.  Abdominal: Soft. Bowel sounds are normal. He exhibits no distension and no mass. There is no tenderness. There is no rebound and no guarding. Hernia confirmed negative in the right inguinal area and confirmed negative in the left inguinal area.  Genitourinary: Rectum normal,  testes normal and penis normal. Rectal exam shows no external hemorrhoid, no internal hemorrhoid, no fissure, no mass, no tenderness and anal tone normal. Guaiac negative stool. Prostate is enlarged (1+ smooth symm BPH). Prostate is not tender. Right testis shows no mass, no swelling and no tenderness. Right testis is descended. Left testis shows no mass, no swelling and no tenderness. Left testis is descended. Circumcised. No penile erythema or penile tenderness. No discharge found.  Musculoskeletal: Normal range of motion. He exhibits no edema or tenderness.  Lymphadenopathy:    He has no cervical adenopathy.       Right: No inguinal adenopathy present.       Left: No inguinal adenopathy present.  Neurological: He is oriented to person, place, and time.  Skin: Skin is warm and dry. No rash noted. He is not diaphoretic. No erythema. No pallor.  Vitals  reviewed.   Lab Results  Component Value Date   WBC 8.9 05/18/2016   HGB 14.4 05/18/2016   HCT 44.2 05/18/2016   PLT 297.0 05/18/2016   GLUCOSE 141* 05/18/2016   CHOL 243* 05/18/2016   TRIG 151.0* 05/18/2016   HDL 39.10 05/18/2016   LDLDIRECT 167.4 03/21/2009   LDLCALC 173* 05/18/2016   ALT 17 05/18/2016   AST 18 05/18/2016   NA 136 05/18/2016   K 4.8 05/18/2016   CL 102 05/18/2016   CREATININE 0.92 05/18/2016   BUN 20 05/18/2016   CO2 24 05/18/2016   TSH 0.39 05/18/2016   PSA 0.71 05/18/2016   HGBA1C 6.8 05/18/2016   MICROALBUR 8.7* 05/18/2016    Dg Chest 2 View  08/13/2015  CLINICAL DATA:  Cough and substernal chest pain for 5 days.  Smoker. EXAM: CHEST  2 VIEW COMPARISON:  03/21/2009 FINDINGS: Cardiomediastinal silhouette is within normal limits. The lungs are well inflated with stable mild chronic bronchitic changes. Mild biapical pleural thickening is noted. There is no evidence of acute airspace consolidation, edema, pleural effusion, or pneumothorax. No acute osseous abnormality is seen. IMPRESSION: No active  cardiopulmonary disease. Electronically Signed   By: Logan Bores M.D.   On: 08/13/2015 15:32    Assessment & Plan:   Sadiki was seen today for cough, diabetes, hypertension, hyperlipidemia, annual exam and gastroesophageal reflux.  Diagnoses and all orders for this visit:  Type 2 diabetes mellitus with complication, without long-term current use of insulin ([place])-  His A1c is 6.8% today, his blood sugars are well-controlled on no medications, he agrees to continue to work on his lifestyle modifications with diet and exercise. -     Microalbumin / creatinine urine ratio; Future -     POCT HgB A1C -     losartan (COZAAR) 100 MG tablet; Take 1 tablet (100 mg total) by mouth daily.  Routine general medical examination at a health care facility-  Exam completed, labs ordered and reviewed, vaccines reviewed, patient education material was given. -     Lipid panel; Future -     PSA; Future -     TSH; Future -     Urinalysis, Routine w reflex microscopic (not at [facility]); Future -     Comprehensive metabolic panel; Future -     CBC with Differential/Platelet; Future -     HIV antibody; Future  Essential hypertension-  His blood pressures adequately well-controlled on an ARB and beta blocker, will continue -     losartan (COZAAR) 100 MG tablet; Take 1 tablet (100 mg total) by mouth daily. -     metoprolol succinate (TOPROL-XL) 25 MG 24 hr tablet; Take 1 tablet (25 mg total) by mouth daily.  Cough-  Will check a chest x-ray today to screen for mass and pneumonia, he has a history of tobacco abuse so I've also asked him to undergo pulmonary function testing to screen for chronic bronchitis and COPD. -     DG Chest 2 View; Future -     Pulmonary Function Test; Future  Dysphagia -  I am concerned he may have some upper GI pathology so I have asked him to see GI to consider upper endoscopy -     Ambulatory referral to Gastroenterology -     Pulmonary Function Test; Future  TOBACCO USE -     Pulmonary  Function Test; Future -     Ambulatory Referral for Lung Cancer Scre  Need for hepatitis C screening test -  Hepatitis C antibody; Future  DOE (dyspnea on exertion)-  He underwent an ETT about 2 years ago that was normal, he has worsening symptoms and subtle changes on his EKG today , his cardiac enzymes are negative, I've asked him to undergo a myocardial perfusion scan to screen for ischemia -     EKG 12-Lead -     Myocardial Perfusion Imaging; Future -     Troponin I; Future -     Cardiac panel; Future  ELECTROCARDIOGRAM, ABNORMAL-  As above -     Myocardial Perfusion Imaging; Future -     Troponin I; Future -     Cardiac panel; Future  LRTI (lower respiratory tract infection)-  I will treat the infection with Augmentin -     amoxicillin-clavulanate (AUGMENTIN) 875-125 MG tablet; Take 1 tablet by mouth 2 (two) times daily.  Vitamin D deficiency -     VITAMIN D 25 Hydroxy (Vit-D Deficiency, Fractures); Future  Other fatigue -     Testosterone, Free, Total, SHBG; Future   I have discontinued Mr. Friedrich varenicline, varenicline, olmesartan, and olmesartan. I am also having him start on amoxicillin-clavulanate, losartan, and metoprolol succinate. Additionally, I am having him maintain his omeprazole, diclofenac sodium, clobetasol cream, ergocalciferol, triamcinolone, and ZYLET.  Meds ordered this encounter  Medications  . amoxicillin-clavulanate (AUGMENTIN) 875-125 MG tablet    Sig: Take 1 tablet by mouth 2 (two) times daily.    Dispense:  20 tablet    Refill:  0  . losartan (COZAAR) 100 MG tablet    Sig: Take 1 tablet (100 mg total) by mouth daily.    Dispense:  90 tablet    Refill:  3  . metoprolol succinate (TOPROL-XL) 25 MG 24 hr tablet    Sig: Take 1 tablet (25 mg total) by mouth daily.    Dispense:  90 tablet    Refill:  1     Follow-up: Return in about 3 weeks (around 06/08/2016).  Scarlette Calico, MD

## 2016-05-19 ENCOUNTER — Telehealth (HOSPITAL_COMMUNITY): Payer: Self-pay | Admitting: *Deleted

## 2016-05-19 ENCOUNTER — Encounter: Payer: Self-pay | Admitting: Internal Medicine

## 2016-05-19 LAB — TESTOSTERONE, FREE, TOTAL, SHBG
Sex Hormone Binding: 42.2 nmol/L (ref 19.3–76.4)
TESTOSTERONE FREE: 5.4 pg/mL — AB (ref 7.2–24.0)
Testosterone: 330 ng/dL — ABNORMAL LOW (ref 348–1197)

## 2016-05-19 LAB — TSH: TSH: 0.39 u[IU]/mL (ref 0.35–4.50)

## 2016-05-19 LAB — PSA: PSA: 0.71 ng/mL (ref 0.10–4.00)

## 2016-05-19 LAB — VITAMIN D 25 HYDROXY (VIT D DEFICIENCY, FRACTURES): VITD: 52.62 ng/mL (ref 30.00–100.00)

## 2016-05-19 NOTE — Telephone Encounter (Signed)
Patient given detailed instructions per Myocardial Perfusion Study Information Sheet for the test on 05/20/16 at 7:30. Patient notified to arrive 15 minutes early and that it is imperative to arrive on time for appointment to keep from having the test rescheduled.  If you need to cancel or reschedule your appointment, please call the office within 24 hours of your appointment. Failure to do so may result in a cancellation of your appointment, and a $50 no show fee. Patient verbalized understanding.Donald Jennings

## 2016-05-20 ENCOUNTER — Other Ambulatory Visit: Payer: Self-pay | Admitting: Internal Medicine

## 2016-05-20 ENCOUNTER — Ambulatory Visit (HOSPITAL_COMMUNITY): Payer: Federal, State, Local not specified - PPO | Attending: Cardiovascular Disease

## 2016-05-20 DIAGNOSIS — R0609 Other forms of dyspnea: Secondary | ICD-10-CM | POA: Insufficient documentation

## 2016-05-20 DIAGNOSIS — R0600 Dyspnea, unspecified: Secondary | ICD-10-CM

## 2016-05-20 DIAGNOSIS — E119 Type 2 diabetes mellitus without complications: Secondary | ICD-10-CM | POA: Insufficient documentation

## 2016-05-20 DIAGNOSIS — R079 Chest pain, unspecified: Secondary | ICD-10-CM | POA: Diagnosis not present

## 2016-05-20 DIAGNOSIS — R9439 Abnormal result of other cardiovascular function study: Secondary | ICD-10-CM

## 2016-05-20 DIAGNOSIS — R9431 Abnormal electrocardiogram [ECG] [EKG]: Secondary | ICD-10-CM | POA: Insufficient documentation

## 2016-05-20 DIAGNOSIS — I10 Essential (primary) hypertension: Secondary | ICD-10-CM | POA: Diagnosis not present

## 2016-05-20 HISTORY — DX: Abnormal result of other cardiovascular function study: R94.39

## 2016-05-20 LAB — MYOCARDIAL PERFUSION IMAGING
CHL CUP MPHR: 161 {beats}/min
CHL CUP NUCLEAR SDS: 1
CHL CUP NUCLEAR SRS: 1
CHL CUP RESTING HR STRESS: 72 {beats}/min
CSEPHR: 89 %
Estimated workload: 12.6 METS
Exercise duration (min): 10 min
Exercise duration (sec): 31 s
LHR: 0.32
LV sys vol: 46 mL
LVDIAVOL: 94 mL (ref 62–150)
Peak HR: 144 {beats}/min
SSS: 2
TID: 1.23

## 2016-05-20 MED ORDER — TECHNETIUM TC 99M TETROFOSMIN IV KIT
11.0000 | PACK | Freq: Once | INTRAVENOUS | Status: AC | PRN
  Administered 2016-05-20: 11 via INTRAVENOUS
  Filled 2016-05-20: qty 11

## 2016-05-20 MED ORDER — TECHNETIUM TC 99M TETROFOSMIN IV KIT
32.6000 | PACK | Freq: Once | INTRAVENOUS | Status: AC | PRN
  Administered 2016-05-20: 33 via INTRAVENOUS
  Filled 2016-05-20: qty 33

## 2016-05-22 DIAGNOSIS — H40013 Open angle with borderline findings, low risk, bilateral: Secondary | ICD-10-CM | POA: Diagnosis not present

## 2016-05-22 DIAGNOSIS — H26493 Other secondary cataract, bilateral: Secondary | ICD-10-CM | POA: Diagnosis not present

## 2016-05-22 DIAGNOSIS — Z961 Presence of intraocular lens: Secondary | ICD-10-CM | POA: Diagnosis not present

## 2016-05-22 DIAGNOSIS — H35372 Puckering of macula, left eye: Secondary | ICD-10-CM | POA: Diagnosis not present

## 2016-05-22 LAB — HM DIABETES EYE EXAM

## 2016-06-01 ENCOUNTER — Encounter: Payer: Self-pay | Admitting: Internal Medicine

## 2016-06-03 ENCOUNTER — Encounter: Payer: Self-pay | Admitting: Internal Medicine

## 2016-06-03 ENCOUNTER — Ambulatory Visit (INDEPENDENT_AMBULATORY_CARE_PROVIDER_SITE_OTHER): Payer: Federal, State, Local not specified - PPO | Admitting: Internal Medicine

## 2016-06-03 VITALS — BP 108/62 | HR 82 | Temp 98.1°F | Resp 16 | Ht 68.0 in | Wt 168.2 lb

## 2016-06-03 DIAGNOSIS — E118 Type 2 diabetes mellitus with unspecified complications: Secondary | ICD-10-CM | POA: Diagnosis not present

## 2016-06-03 DIAGNOSIS — G453 Amaurosis fugax: Secondary | ICD-10-CM | POA: Insufficient documentation

## 2016-06-03 DIAGNOSIS — E785 Hyperlipidemia, unspecified: Secondary | ICD-10-CM | POA: Diagnosis not present

## 2016-06-03 DIAGNOSIS — R931 Abnormal findings on diagnostic imaging of heart and coronary circulation: Secondary | ICD-10-CM

## 2016-06-03 DIAGNOSIS — R9439 Abnormal result of other cardiovascular function study: Secondary | ICD-10-CM

## 2016-06-03 HISTORY — DX: Hyperlipidemia, unspecified: E78.5

## 2016-06-03 HISTORY — DX: Amaurosis fugax: G45.3

## 2016-06-03 MED ORDER — ROSUVASTATIN CALCIUM 40 MG PO TABS: 40.0000 mg | ORAL_TABLET | Freq: Every day | ORAL | Status: DC

## 2016-06-03 MED ORDER — ASPIRIN EC 81 MG PO TBEC: 81.0000 mg | DELAYED_RELEASE_TABLET | Freq: Every day | ORAL | Status: DC

## 2016-06-03 MED ORDER — NITROGLYCERIN 0.4 MG SL SUBL: 0.4000 mg | SUBLINGUAL_TABLET | SUBLINGUAL | Status: DC | PRN

## 2016-06-03 NOTE — Patient Instructions (Signed)
Transient Ischemic Attack °A transient ischemic attack (TIA) is a "warning stroke" that causes stroke-like symptoms. Unlike a stroke, a TIA does not cause permanent damage to the brain. The symptoms of a TIA can happen very fast and do not last long. It is important to know the symptoms of a TIA and what to do. This can help prevent a major stroke or death. °CAUSES  °A TIA is caused by a temporary blockage in an artery in the brain or neck (carotid artery). The blockage does not allow the brain to get the blood supply it needs and can cause different symptoms. The blockage can be caused by either: °· A blood clot. °· Fatty buildup (plaque) in a neck or brain artery. °RISK FACTORS °· High blood pressure (hypertension). °· High cholesterol. °· Diabetes mellitus. °· Heart disease. °· The buildup of plaque in the blood vessels (peripheral artery disease or atherosclerosis). °· The buildup of plaque in the blood vessels that provide blood and oxygen to the brain (carotid artery stenosis). °· An abnormal heart rhythm (atrial fibrillation). °· Obesity. °· Using any tobacco products, including cigarettes, chewing tobacco, or electronic cigarettes. °· Taking oral contraceptives, especially in combination with using tobacco. °· Physical inactivity. °· A diet high in fats, salt (sodium), and calories. °· Excessive alcohol use. °· Use of illegal drugs (especially cocaine and methamphetamine). °· Being male. °· Being African American. °· Being over the age of 55 years. °· Family history of stroke. °· Previous history of blood clots, stroke, TIA, or heart attack. °· Sickle cell disease. °SIGNS AND SYMPTOMS  °TIA symptoms are the same as a stroke but are temporary. These symptoms usually develop suddenly, or may be newly present upon waking from sleep: °· Sudden weakness or numbness of the face, arm, or leg, especially on one side of the body. °· Sudden trouble walking or difficulty moving arms or legs. °· Sudden  confusion. °· Sudden personality changes. °· Trouble speaking (aphasia) or understanding. °· Difficulty swallowing. °· Sudden trouble seeing in one or both eyes. °· Double vision. °· Dizziness. °· Loss of balance or coordination. °· Sudden severe headache with no known cause. °· Trouble reading or writing. °· Loss of bowel or bladder control. °· Loss of consciousness. °DIAGNOSIS  °Your health care provider may be able to determine the presence or absence of a TIA based on your symptoms, history, and physical exam. CT scan of the brain is usually performed to help identify a TIA. Other tests may include: °· Electrocardiography (ECG). °· Continuous heart monitoring. °· Echocardiography. °· Carotid ultrasonography. °· MRI. °· A scan of the brain circulation. °· Blood tests. °TREATMENT  °Since the symptoms of TIA are the same as a stroke, it is important to seek treatment as soon as possible. You may need a medicine to dissolve a blood clot (thrombolytic) if that is the cause of the TIA. This medicine cannot be given if too much time has passed. Treatment may also include:  °· Rest, oxygen, fluids through an IV tube, and medicines to thin the blood (anticoagulants). °· Measures will be taken to prevent short-term and long-term complications, including infection from breathing foreign material into the lungs (aspiration pneumonia), blood clots in the legs, and falls. °· Procedures to either remove plaque in the carotid arteries or dilate carotid arteries that have narrowed due to plaque. Those procedures are: °¨ Carotid endarterectomy. °¨ Carotid angioplasty and stenting. °· Medicines and diet may be used to address diabetes, high blood pressure, and   other underlying risk factors. °HOME CARE INSTRUCTIONS  °· Take medicines only as directed by your health care provider. Follow the directions carefully. Medicines may be used to control risk factors for a stroke. Be sure you understand all your medicine instructions. °· You  may be told to take aspirin or the anticoagulant warfarin. Warfarin needs to be taken exactly as instructed. °¨ Taking too much or too little warfarin is dangerous. Too much warfarin increases the risk of bleeding. Too little warfarin continues to allow the risk for blood clots. While taking warfarin, you will need to have regular blood tests to measure your blood clotting time. A PT blood test measures how long it takes for blood to clot. Your PT is used to calculate another value called an INR. Your PT and INR help your health care provider to adjust your dose of warfarin. The dose can change for many reasons. It is critically important that you take warfarin exactly as prescribed. °¨ Many foods, especially foods high in vitamin K can interfere with warfarin and affect the PT and INR. Foods high in vitamin K include spinach, kale, broccoli, cabbage, collard and turnip greens, Brussels sprouts, peas, cauliflower, seaweed, and parsley, as well as beef and pork liver, green tea, and soybean oil. You should eat a consistent amount of foods high in vitamin K. Avoid major changes in your diet, or notify your health care provider before changing your diet. Arrange a visit with a dietitian to answer your questions. °¨ Many medicines can interfere with warfarin and affect the PT and INR. You must tell your health care provider about any and all medicines you take; this includes all vitamins and supplements. Be especially cautious with aspirin and anti-inflammatory medicines. Do not take or discontinue any prescribed or over-the-counter medicine except on the advice of your health care provider or pharmacist. °¨ Warfarin can have side effects, such as excessive bruising or bleeding. You will need to hold pressure over cuts for longer than usual. Your health care provider or pharmacist will discuss other potential side effects. °¨ Avoid sports or activities that may cause injury or bleeding. °¨ Be careful when shaving,  flossing your teeth, or handling sharp objects. °¨ Alcohol can change the body's ability to handle warfarin. It is best to avoid alcoholic drinks or consume only very small amounts while taking warfarin. Notify your health care provider if you change your alcohol intake. °¨ Notify your dentist or other health care providers before procedures. °· Eat a diet that includes 5 or more servings of fruits and vegetables each day. This may reduce the risk of stroke. Certain diets may be prescribed to address high blood pressure, high cholesterol, diabetes, or obesity. °¨ A diet low in sodium, saturated fat, trans fat, and cholesterol is recommended to manage high blood pressure. °¨ A diet low in saturated fat, trans fat, and cholesterol, and high in fiber may control cholesterol levels. °¨ A controlled-carbohydrate, controlled-sugar diet is recommended to manage diabetes. °¨ A reduced-calorie diet that is low in sodium, saturated fat, trans fat, and cholesterol is recommended to manage obesity. °· Maintain a healthy weight. °· Stay physically active. It is recommended that you get at least 30 minutes of activity on most or all days. °· Do not use any tobacco products, including cigarettes, chewing tobacco, or electronic cigarettes. If you need help quitting, ask your health care provider. °· Limit alcohol intake to no more than 1 drink per day for nonpregnant women and 2 drinks   per day for men. One drink equals 12 ounces of beer, 5 ounces of wine, or 1½ ounces of hard liquor. °· Do not abuse drugs. °· A safe home environment is important to reduce the risk of falls. Your health care provider may arrange for specialists to evaluate your home. Having grab bars in the bedroom and bathroom is often important. Your health care provider may arrange for equipment to be used at home, such as raised toilets and a seat for the shower. °· Follow all instructions for follow-up with your health care provider. This is very important.  This includes any referrals and lab tests. Proper follow-up can prevent a stroke or another TIA from occurring. °PREVENTION  °The risk of a TIA can be decreased by appropriately treating high blood pressure, high cholesterol, diabetes, heart disease, and obesity, and by quitting smoking, limiting alcohol, and staying physically active. °SEEK MEDICAL CARE IF: °· You have personality changes. °· You have difficulty swallowing. °· You are seeing double. °· You have dizziness. °· You have a fever. °SEEK IMMEDIATE MEDICAL CARE IF:  °Any of the following symptoms may represent a serious problem that is an emergency. Do not wait to see if the symptoms will go away. Get medical help right away. Call your local emergency services (911 in [place] Do not drive yourself to the hospital. °· You have sudden weakness or numbness of the face, arm, or leg, especially on one side of the body. °· You have sudden trouble walking or difficulty moving arms or legs. °· You have sudden confusion. °· You have trouble speaking (aphasia) or understanding. °· You have sudden trouble seeing in one or both eyes. °· You have a loss of balance or coordination. °· You have a sudden, severe headache with no known cause. °· You have new chest pain or an irregular heartbeat. °· You have a partial or total loss of consciousness. °MAKE SURE YOU:  °· Understand these instructions. °· Will watch your condition. °· Will get help right away if you are not doing well or get worse. °  °This information is not intended to replace advice given to you by your health care provider. Make sure you discuss any questions you have with your health care provider. °  °Document Released: 08/26/2005 Document Revised: 12/07/2014 Document Reviewed: 02/21/2014 °Elsevier Interactive Patient Education ©2016 [employer] ° °

## 2016-06-03 NOTE — Progress Notes (Addendum)
Subjective:  Patient ID: Donald Jennings, male    DOB: 1956-01-24  Age: 60 y.o. MRN: [id number]  CC: Hyperlipidemia and Diabetes   HPI [facility] presents for follow-up. Since I last saw him he has seen an eye doctor and was told that he has had a TIA in his eye consistent with amaurosis fugax. I received paperwork from the ophthalmologist requesting that I check out his brain, his carotids, and an echocardiogram to screen for thromboembolic sources. He recently underwent a thallium scan that was abnormal. The cardiologist that he has been referred to has not been able to reach him because his phone number was not working properly. He gave me the phone number of his wife today for [place] to use stating that that is more reliable than his phone number. He has had a few episodes of stinging chest pain at rest.  Outpatient Prescriptions Prior to Visit  Medication Sig Dispense Refill  . clobetasol cream (TEMOVATE) [id number] % Apply 1 application topically 2 (two) times daily. 60 g 2  . diclofenac sodium (VOLTAREN) 1 % GEL Apply 2 g topically 4 (four) times daily. 1 Tube 5  . ergocalciferol (DRISDOL) 50000 UNITS capsule Take 1 capsule (50,000 Units total) by mouth once a week. 4 capsule 11  . losartan (COZAAR) 100 MG tablet Take 1 tablet (100 mg total) by mouth daily. 90 tablet 3  . metoprolol succinate (TOPROL-XL) 25 MG 24 hr tablet Take 1 tablet (25 mg total) by mouth daily. 90 tablet 1  . omeprazole (PRILOSEC OTC) 20 MG tablet Take 20 mg by mouth daily.    [name] triamcinolone (KENALOG) 0.025 % cream Reported on 05/18/2016    . ZYLET 0.5-0.3 % SUSP Reported on 05/18/2016  0  . BENICAR 20 MG tablet TAKE 1 TABLET BY MOUTH EVERY DAY FOR HIGH BLOOD PRESSURE 90 tablet 1  . olmesartan (BENICAR) 20 MG tablet TAKE 1 TABLET BY MOUTH EVERY DAY FOR HIGH BLOOD PRESSURE 90 tablet 2   No facility-administered medications prior to visit.    ROS Review of Systems  Constitutional: Negative.  Negative for fever, chills,  diaphoresis, appetite change and fatigue.  HENT: Negative.  Negative for sore throat and trouble swallowing.   Eyes: Negative.  Negative for visual disturbance.  Respiratory: Negative.  Negative for cough, choking, chest tightness, shortness of breath and stridor.   Cardiovascular: Positive for chest pain. Negative for palpitations and leg swelling.  Gastrointestinal: Negative.  Negative for nausea, vomiting, abdominal pain, diarrhea and constipation.  Endocrine: Negative.  Negative for polydipsia, polyphagia and polyuria.  Genitourinary: Negative.  Negative for dysuria and difficulty urinating.  Musculoskeletal: Negative.  Negative for myalgias, back pain, joint swelling, arthralgias and neck pain.  Skin: Negative.  Negative for color change and rash.  Allergic/Immunologic: Negative.   Neurological: Negative.  Negative for dizziness, tremors, syncope, light-headedness, numbness and headaches.  Hematological: Negative.  Negative for adenopathy. Does not bruise/bleed easily.  Psychiatric/Behavioral: Negative.     Objective:  BP 108/62 mmHg  Pulse 82  Temp(Src) 98.1 F (36.7 C) (Oral)  Resp 16  Ht 5\' 8"  (1.727 m)  Wt 168 lb 4 oz (76.318 kg)  BMI 25.59 kg/m2  SpO2 96%  BP Readings from Last 3 Encounters:  06/03/16 108/62  05/18/16 138/78  08/13/15 128/78    Wt Readings from Last 3 Encounters:  06/03/16 168 lb 4 oz (76.318 kg)  05/20/16 162 lb (73.483 kg)  05/18/16 162 lb (73.483 kg)    Physical  Exam  Constitutional: He is oriented to person, place, and time. No distress.  HENT:  Mouth/Throat: Oropharynx is clear and moist. No oropharyngeal exudate.  Eyes: Conjunctivae are normal. Right eye exhibits no discharge. Left eye exhibits no discharge. No scleral icterus.  Neck: Normal range of motion. Neck supple. No JVD present. No tracheal deviation present. No thyromegaly present.  Cardiovascular: Normal rate, regular rhythm, normal heart sounds and intact distal pulses.  Exam  reveals no gallop and no friction rub.   No murmur heard. Pulmonary/Chest: Effort normal and breath sounds normal. No stridor. No respiratory distress. He has no wheezes. He has no rales. He exhibits no tenderness.  Abdominal: Soft. Bowel sounds are normal. He exhibits no distension and no mass. There is no tenderness. There is no rebound and no guarding.  Musculoskeletal: Normal range of motion. He exhibits no edema or tenderness.  Lymphadenopathy:    He has no cervical adenopathy.  Neurological: He is oriented to person, place, and time.  Skin: Skin is warm and dry. No rash noted. He is not diaphoretic. No erythema. No pallor.  Vitals reviewed.   Lab Results  Component Value Date   WBC 8.9 05/18/2016   HGB 14.4 05/18/2016   HCT 44.2 05/18/2016   PLT 297.0 05/18/2016   GLUCOSE 141* 05/18/2016   CHOL 243* 05/18/2016   TRIG 151.0* 05/18/2016   HDL 39.10 05/18/2016   LDLDIRECT 167.4 03/21/2009   LDLCALC 173* 05/18/2016   ALT 17 05/18/2016   AST 18 05/18/2016   NA 136 05/18/2016   K 4.8 05/18/2016   CL 102 05/18/2016   CREATININE 0.92 05/18/2016   BUN 20 05/18/2016   CO2 24 05/18/2016   TSH 0.39 05/18/2016   PSA 0.71 05/18/2016   HGBA1C 6.8 05/18/2016   MICROALBUR 8.7* 05/18/2016    Dg Chest 2 View  05/19/2016  CLINICAL DATA:  Cough, sore throat, cold. EXAM: CHEST  2 VIEW COMPARISON:  08/13/2015 FINDINGS: Heart and mediastinal contours are within normal limits. No focal opacities or effusions. No acute bony abnormality. IMPRESSION: No active cardiopulmonary disease. Electronically Signed   By: Rolm Baptise M.D.   On: 05/19/2016 08:14    Assessment & Plan:   Travonn was seen today for hyperlipidemia and diabetes.  Diagnoses and all orders for this visit:  Type 2 diabetes mellitus with complication, without long-term current use of insulin (HCC) -     rosuvastatin (CRESTOR) 40 MG tablet; Take 1 tablet (40 mg total) by mouth daily. -     aspirin EC 81 MG tablet; Take 1 tablet  (81 mg total) by mouth daily.  Amaurosis fugax- will check an MRI of his brain to screen for CVA, will also get an MRA of his carotids to screen for carotid atherosclerosis, will check an echocardiogram to look for thromboembolic source. Will start risk factor modification with statin therapy, aspirin therapy. He will continue the ARB and will continue to maintain good blood sugar control. -     rosuvastatin (CRESTOR) 40 MG tablet; Take 1 tablet (40 mg total) by mouth daily. -     aspirin EC 81 MG tablet; Take 1 tablet (81 mg total) by mouth daily. -     MR Brain Wo Contrast; Future -     MR MRA Head/Brain Wo Cm; Future -     ECHOCARDIOGRAM COMPLETE; Future  Hyperlipidemia with target LDL less than 100- he has not achieved his LDL goal and he is high risk so I have asked him  to start high-intensity statin therapy -     rosuvastatin (CRESTOR) 40 MG tablet; Take 1 tablet (40 mg total) by mouth daily.  Abnormal thallium stress test- I am concerned that he has CAD so I have asked him to start aspirin therapy, a statin, and use nitroglycerin as needed for any chest discomfort. He is aware that if he has worsening or unstable chest pain he will go straight to the emergency room. -     nitroGLYCERIN (NITROSTAT) 0.4 MG SL tablet; Place 1 tablet (0.4 mg total) under the tongue every 5 (five) minutes as needed for chest pain.   I have discontinued Mr. Finch Pfuhl and olmesartan. I am also having him start on rosuvastatin, aspirin EC, and nitroGLYCERIN. Additionally, I am having him maintain his omeprazole, diclofenac sodium, clobetasol cream, ergocalciferol, triamcinolone, ZYLET, losartan, and metoprolol succinate.  Meds ordered this encounter  Medications  . rosuvastatin (CRESTOR) 40 MG tablet    Sig: Take 1 tablet (40 mg total) by mouth daily.    Dispense:  90 tablet    Refill:  3  . aspirin EC 81 MG tablet    Sig: Take 1 tablet (81 mg total) by mouth daily.    Dispense:  90 tablet    Refill:   3  . nitroGLYCERIN (NITROSTAT) 0.4 MG SL tablet    Sig: Place 1 tablet (0.4 mg total) under the tongue every 5 (five) minutes as needed for chest pain.    Dispense:  50 tablet    Refill:  3     Follow-up: Return in about 6 weeks (around 07/15/2016).  Scarlette Calico, MD

## 2016-06-03 NOTE — Progress Notes (Signed)
Pre visit review using our clinic review tool, if applicable. No additional management support is needed unless otherwise documented below in the visit note. 

## 2016-06-04 NOTE — Addendum Note (Signed)
Addended by: Janith Lima on: 06/04/2016 01:09 PM   Modules accepted: Miquel Dunn

## 2016-06-10 ENCOUNTER — Encounter: Payer: Self-pay | Admitting: Cardiology

## 2016-06-10 ENCOUNTER — Ambulatory Visit (INDEPENDENT_AMBULATORY_CARE_PROVIDER_SITE_OTHER): Payer: Federal, State, Local not specified - PPO | Admitting: Cardiology

## 2016-06-10 VITALS — BP 102/50 | HR 92 | Ht 68.0 in | Wt 163.6 lb

## 2016-06-10 DIAGNOSIS — R0600 Dyspnea, unspecified: Secondary | ICD-10-CM

## 2016-06-10 DIAGNOSIS — R0602 Shortness of breath: Secondary | ICD-10-CM

## 2016-06-10 DIAGNOSIS — R0609 Other forms of dyspnea: Secondary | ICD-10-CM

## 2016-06-10 DIAGNOSIS — I10 Essential (primary) hypertension: Secondary | ICD-10-CM

## 2016-06-10 DIAGNOSIS — G453 Amaurosis fugax: Secondary | ICD-10-CM

## 2016-06-10 DIAGNOSIS — R079 Chest pain, unspecified: Secondary | ICD-10-CM | POA: Diagnosis not present

## 2016-06-10 DIAGNOSIS — G459 Transient cerebral ischemic attack, unspecified: Secondary | ICD-10-CM

## 2016-06-10 HISTORY — DX: Shortness of breath: R06.02

## 2016-06-10 NOTE — Progress Notes (Signed)
Cardiology Office Note    Date:  06/10/2016   ID:  Donald Jennings, DOB Dec 19, 1955, MRN [id number]  PCP:  Scarlette Calico, MD  Cardiologist:  Fransico Him, MD   No chief complaint on file.   History of Present Illness:  Donald Jennings is a 60 y.o. male with a history of hyperlipidemia, DM, HTN and recent TIA in his eye and was seen by opthalmology and it was recommended that he had an echo and carotid dopplers.  He had also been complaining of worsening SOB and DOE for the past year but only when walking up an incline in his back yard.  He works out at [employer] without any problems.  He also has had some atypical chest pain that occurs when he is at rest but does not notice is at the gym but when he is working out his teeth will hurt as well as his abdomen.  He had a stress test done that showed EF 51% with T wave inversions and 68mm of lateral ST depression during stress but no ischemia on nuclear images.  Carotid dopplers have not been done. He is a smoker and also has HTN.     Past Medical History  Diagnosis Date  . TOBACCO USE 03/21/2009    Qualifier: Diagnosis of  By: Ronnald Ramp MD, Arvid Right.   . DEPRESSION 03/21/2009    Qualifier: Diagnosis of  By: Ronnald Ramp MD, Arvid Right.   . HYPERTENSION 06/19/2009    Qualifier: Diagnosis of  By: Ronnald Ramp MD, Arvid Right.   [name] GERD 06/19/2009    Qualifier: Diagnosis of  By: Ronnald Ramp MD, Arvid Right.   . CERVICAL RADICULOPATHY 07/02/2010    Qualifier: Diagnosis of  By: Ronnald Ramp MD, Artist Pais INJURY, RIGHT 07/02/2010    Qualifier: Diagnosis of  By: Ronnald Ramp MD, Arvid Right.   [name] Hx of cardiovascular stress test     ETT-Myoview (03/2014):  No ischemia, EF 56%; Low Risk  . SOB (shortness of breath) 06/10/2016    Past Surgical History  Procedure Laterality Date  . Cataract extraction      Left 2009 at [facility], Right 2004 in [place]    Current Medications: Outpatient Prescriptions Prior to Visit  Medication Sig Dispense Refill  . aspirin EC 81 MG tablet Take 1 tablet (81 mg  total) by mouth daily. 90 tablet 3  . clobetasol cream (TEMOVATE) [id number] % Apply 1 application topically 2 (two) times daily. 60 g 2  . diclofenac sodium (VOLTAREN) 1 % GEL Apply 2 g topically 4 (four) times daily. 1 Tube 5  . ergocalciferol (DRISDOL) 50000 UNITS capsule Take 1 capsule (50,000 Units total) by mouth once a week. 4 capsule 11  . losartan (COZAAR) 100 MG tablet Take 1 tablet (100 mg total) by mouth daily. 90 tablet 3  . metoprolol succinate (TOPROL-XL) 25 MG 24 hr tablet Take 1 tablet (25 mg total) by mouth daily. 90 tablet 1  . nitroGLYCERIN (NITROSTAT) 0.4 MG SL tablet Place 1 tablet (0.4 mg total) under the tongue every 5 (five) minutes as needed for chest pain. 50 tablet 3  . omeprazole (PRILOSEC OTC) 20 MG tablet Take 20 mg by mouth daily.    . rosuvastatin (CRESTOR) 40 MG tablet Take 1 tablet (40 mg total) by mouth daily. 90 tablet 3  . triamcinolone (KENALOG) 0.025 % cream Reported on 05/18/2016    . ZYLET 0.5-0.3 % SUSP Reported on 05/18/2016  0   No facility-administered  medications prior to visit.     Allergies:   Review of patient's allergies indicates no known allergies.   Social History   Social History  . Marital Status: Married    Spouse Name: N/A  . Number of Children: N/A  . Years of Education: N/A   Occupational History  . Scientist, clinical (histocompatibility and immunogenetics)    Social History Main Topics  . Smoking status: Current Every Day Smoker  . Smokeless tobacco: Never Used  . Alcohol Use: No  . Drug Use: No  . Sexual Activity: Not Asked   Other Topics Concern  . None   Social History Narrative     Family History:  [name] family history includes Arthritis in his other; Diabetes in his other; Stroke in his other. There is no history of Cancer, Early death, Heart disease, Hypertension, Hyperlipidemia, or Kidney disease.   ROS:   Please see the history of present illness.    ROS All other systems reviewed and are negative.   PHYSICAL EXAM:   VS:  BP 102/50 mmHg   Pulse 92  Ht 5\' 8"  (1.727 m)  Wt 163 lb 9.6 oz (74.208 kg)  BMI 24.88 kg/m2  SpO2 96%   GEN: Well nourished, well developed, in no acute distress HEENT: normal Neck: no JVD, carotid bruits, or masses Cardiac: RRR; no murmurs, rubs, or gallops,no edema.  Intact distal pulses bilaterally.  Respiratory:  clear to auscultation bilaterally, normal work of breathing GI: soft, nontender, nondistended, + BS MS: no deformity or atrophy Skin: warm and dry, no rash Neuro:  Alert and Oriented x 3, Strength and sensation are intact Psych: euthymic mood, full affect  Wt Readings from Last 3 Encounters:  06/10/16 163 lb 9.6 oz (74.208 kg)  06/03/16 168 lb 4 oz (76.318 kg)  05/20/16 162 lb (73.483 kg)      Studies/Labs Reviewed:   EKG:  EKG is not ordered today.    Recent Labs: 05/18/2016: ALT 17; BUN 20; Creatinine, Ser 0.92; Hemoglobin 14.4; Platelets 297.0; Potassium 4.8; Sodium 136; TSH 0.39   Lipid Panel    Component Value Date/Time   CHOL 243* 05/18/2016 1646   TRIG 151.0* 05/18/2016 1646   HDL 39.10 05/18/2016 1646   CHOLHDL 6 05/18/2016 1646   VLDL 30.2 05/18/2016 1646   LDLCALC 173* 05/18/2016 1646   LDLDIRECT 167.4 03/21/2009 1030    Additional studies/ records that were reviewed today include:  Office noted from PCP and nuclear stress test    ASSESSMENT:    1. Amaurosis fugax   2. Essential hypertension   3. DOE (dyspnea on exertion)      PLAN:  In order of problems listed above:  1. Amaurosis fugax - Will set up echo to evaluate with echo and carotid dopplers. He has an MRI/MRA of the head next week.  If this documents a CVA and no other etiology can be found may need ILR. 2. HTN - BP controlled on current meds. 3. DOE of ? Etiology with very atypical CP.  His chest discomfort only occurs when he is at rest and never occurs with working out at the gym.  Recent nuclear stress test showed 1-58mm of horizontal ST segment depression similar to stress test 2015 when  nuclear images were normal.  Nuclear images this time are also normal.  Given his atypical CP and ? CVA recently would avoid going straight to cath.  I will set him for a coronary CTA with morphology and calcium score.  Medication Adjustments/Labs and Tests Ordered: Current medicines are reviewed at length with the patient today.  Concerns regarding medicines are outlined above.  Medication changes, Labs and Tests ordered today are listed in the Patient Instructions below.  There are no Patient Instructions on file for this visit.   Signed, Fransico Him, MD  06/10/2016 2:24 PM    [address], [place], [place]  [address] Phone: [phone]; Fax: [phone]

## 2016-06-10 NOTE — Patient Instructions (Signed)
Medication Instructions:  Your physician recommends that you continue on your current medications as directed. Please refer to the Current Medication list given to you today.   Labwork: None  Testing/Procedures: Your physician has requested that you have a carotid duplex. This test is an ultrasound of the carotid arteries in your neck. It looks at blood flow through these arteries that supply the brain with blood. Allow one hour for this exam. There are no restrictions or special instructions.  Dr. Radford Pax recommends you have a CORONARY CT.  Follow-Up: Your physician recommends that you schedule a follow-up appointment AS NEEDED with Dr. Radford Pax pending study results.  Any Other Special Instructions Will Be Listed Below (If Applicable).     If you need a refill on your cardiac medications before your next appointment, please call your pharmacy.

## 2016-06-11 ENCOUNTER — Other Ambulatory Visit: Payer: Self-pay | Admitting: Acute Care

## 2016-06-11 DIAGNOSIS — F17210 Nicotine dependence, cigarettes, uncomplicated: Secondary | ICD-10-CM

## 2016-06-15 ENCOUNTER — Ambulatory Visit (HOSPITAL_COMMUNITY)
Admission: RE | Admit: 2016-06-15 | Discharge: 2016-06-15 | Disposition: A | Discharge status: Home / Self Care | Payer: Federal, State, Local not specified - PPO | Source: Ambulatory Visit | Attending: Cardiology | Admitting: Cardiology

## 2016-06-15 ENCOUNTER — Encounter: Payer: Self-pay | Admitting: Cardiology

## 2016-06-15 DIAGNOSIS — I6523 Occlusion and stenosis of bilateral carotid arteries: Secondary | ICD-10-CM | POA: Insufficient documentation

## 2016-06-15 DIAGNOSIS — I10 Essential (primary) hypertension: Secondary | ICD-10-CM | POA: Diagnosis not present

## 2016-06-15 DIAGNOSIS — K219 Gastro-esophageal reflux disease without esophagitis: Secondary | ICD-10-CM | POA: Insufficient documentation

## 2016-06-15 DIAGNOSIS — G459 Transient cerebral ischemic attack, unspecified: Secondary | ICD-10-CM | POA: Insufficient documentation

## 2016-06-15 DIAGNOSIS — Z720 Tobacco use: Secondary | ICD-10-CM | POA: Insufficient documentation

## 2016-06-15 DIAGNOSIS — F329 Major depressive disorder, single episode, unspecified: Secondary | ICD-10-CM | POA: Insufficient documentation

## 2016-06-16 ENCOUNTER — Telehealth: Payer: Self-pay

## 2016-06-16 DIAGNOSIS — I6529 Occlusion and stenosis of unspecified carotid artery: Secondary | ICD-10-CM

## 2016-06-16 NOTE — Telephone Encounter (Signed)
-----   Message from Sueanne Margarita, MD sent at 06/15/2016  5:57 PM EDT ----- 1-39% bilateral carotid artery stenosis - repeat study in 2 years

## 2016-06-16 NOTE — Telephone Encounter (Signed)
Informed patient's DPR of results and verbal understanding expressed.   Repeat carotids ordered to be scheduled in 2 years. DPR agrees with treatment plan.

## 2016-06-22 ENCOUNTER — Ambulatory Visit
Admission: RE | Admit: 2016-06-22 | Discharge: 2016-06-22 | Disposition: A | Discharge status: Home / Self Care | Payer: Federal, State, Local not specified - PPO | Source: Ambulatory Visit | Attending: Internal Medicine | Admitting: Internal Medicine

## 2016-06-22 ENCOUNTER — Other Ambulatory Visit (HOSPITAL_COMMUNITY): Payer: Self-pay

## 2016-06-22 ENCOUNTER — Other Ambulatory Visit: Payer: Self-pay | Admitting: Internal Medicine

## 2016-06-22 ENCOUNTER — Ambulatory Visit (HOSPITAL_COMMUNITY): Payer: Federal, State, Local not specified - PPO | Attending: Internal Medicine

## 2016-06-22 DIAGNOSIS — G453 Amaurosis fugax: Secondary | ICD-10-CM | POA: Diagnosis not present

## 2016-06-22 DIAGNOSIS — Z720 Tobacco use: Secondary | ICD-10-CM | POA: Diagnosis not present

## 2016-06-22 DIAGNOSIS — I119 Hypertensive heart disease without heart failure: Secondary | ICD-10-CM | POA: Insufficient documentation

## 2016-06-22 DIAGNOSIS — E119 Type 2 diabetes mellitus without complications: Secondary | ICD-10-CM | POA: Insufficient documentation

## 2016-06-22 DIAGNOSIS — I6509 Occlusion and stenosis of unspecified vertebral artery: Secondary | ICD-10-CM | POA: Insufficient documentation

## 2016-06-22 DIAGNOSIS — I6502 Occlusion and stenosis of left vertebral artery: Secondary | ICD-10-CM

## 2016-06-22 HISTORY — DX: Occlusion and stenosis of unspecified vertebral artery: I65.09

## 2016-06-24 ENCOUNTER — Encounter (HOSPITAL_COMMUNITY): Payer: Self-pay

## 2016-06-24 ENCOUNTER — Ambulatory Visit (HOSPITAL_COMMUNITY)
Admission: RE | Admit: 2016-06-24 | Discharge: 2016-06-24 | Disposition: A | Discharge status: Home / Self Care | Payer: Federal, State, Local not specified - PPO | Source: Ambulatory Visit | Attending: Cardiology | Admitting: Cardiology

## 2016-06-24 DIAGNOSIS — R079 Chest pain, unspecified: Secondary | ICD-10-CM

## 2016-06-24 DIAGNOSIS — R0600 Dyspnea, unspecified: Secondary | ICD-10-CM

## 2016-06-24 DIAGNOSIS — I2510 Atherosclerotic heart disease of native coronary artery without angina pectoris: Secondary | ICD-10-CM | POA: Diagnosis not present

## 2016-06-24 DIAGNOSIS — R0609 Other forms of dyspnea: Secondary | ICD-10-CM | POA: Insufficient documentation

## 2016-06-24 HISTORY — DX: Type 2 diabetes mellitus without complications: E11.9

## 2016-06-24 MED ORDER — METOPROLOL TARTRATE 5 MG/5ML IV SOLN
5.0000 mg | INTRAVENOUS | Status: DC | PRN
  Administered 2016-06-24 (×3): 5 mg via INTRAVENOUS

## 2016-06-24 MED ORDER — NITROGLYCERIN 0.4 MG SL SUBL
SUBLINGUAL_TABLET | SUBLINGUAL | Status: AC
  Administered 2016-06-24: 0.8 mg via SUBLINGUAL
  Filled 2016-06-24: qty 2

## 2016-06-24 MED ORDER — IOPAMIDOL (ISOVUE-370) INJECTION 76%
INTRAVENOUS | Status: AC
  Administered 2016-06-24: 100 mL
  Filled 2016-06-24: qty 100

## 2016-06-24 MED ORDER — METOPROLOL TARTRATE 5 MG/5ML IV SOLN
INTRAVENOUS | Status: AC
  Administered 2016-06-24: 5 mg via INTRAVENOUS
  Filled 2016-06-24: qty 15

## 2016-06-24 MED ORDER — NITROGLYCERIN 0.4 MG SL SUBL
0.8000 mg | SUBLINGUAL_TABLET | SUBLINGUAL | Status: DC | PRN
  Administered 2016-06-24: 0.8 mg via SUBLINGUAL
  Filled 2016-06-24: qty 25

## 2016-06-26 ENCOUNTER — Other Ambulatory Visit: Payer: Self-pay | Admitting: *Deleted

## 2016-06-26 DIAGNOSIS — Z79899 Other long term (current) drug therapy: Secondary | ICD-10-CM

## 2016-06-26 DIAGNOSIS — E785 Hyperlipidemia, unspecified: Secondary | ICD-10-CM

## 2016-07-03 ENCOUNTER — Encounter (INDEPENDENT_AMBULATORY_CARE_PROVIDER_SITE_OTHER): Payer: Federal, State, Local not specified - PPO | Admitting: Internal Medicine

## 2016-07-03 DIAGNOSIS — R059 Cough, unspecified: Secondary | ICD-10-CM

## 2016-07-03 DIAGNOSIS — F17200 Nicotine dependence, unspecified, uncomplicated: Secondary | ICD-10-CM

## 2016-07-03 DIAGNOSIS — R05 Cough: Secondary | ICD-10-CM | POA: Diagnosis not present

## 2016-07-03 DIAGNOSIS — R1310 Dysphagia, unspecified: Secondary | ICD-10-CM

## 2016-07-03 LAB — PULMONARY FUNCTION TEST
DL/VA % PRED: 97 %
DL/VA: 4.38 ml/min/mmHg/L
DLCO COR % PRED: 64 %
DLCO COR: 19.11 ml/min/mmHg
DLCO unc % pred: 65 %
DLCO unc: 19.32 ml/min/mmHg
FEF 25-75 POST: 1.98 L/s
FEF 25-75 Pre: 3.3 L/sec
FEF2575-%CHANGE-POST: -40 %
FEF2575-%PRED-PRE: 116 %
FEF2575-%Pred-Post: 70 %
FEV1-%Change-Post: -7 %
FEV1-%Pred-Post: 65 %
FEV1-%Pred-Pre: 70 %
FEV1-Post: 2.2 L
FEV1-Pre: 2.38 L
FEV1FVC-%CHANGE-POST: -3 %
FEV1FVC-%Pred-Pre: 111 %
FEV6-%CHANGE-POST: -3 %
FEV6-%PRED-PRE: 65 %
FEV6-%Pred-Post: 63 %
FEV6-PRE: 2.78 L
FEV6-Post: 2.69 L
FEV6FVC-%Pred-Post: 105 %
FEV6FVC-%Pred-Pre: 105 %
FVC-%CHANGE-POST: -4 %
FVC-%PRED-POST: 60 %
FVC-%PRED-PRE: 63 %
FVC-POST: 2.69 L
FVC-PRE: 2.82 L
POST FEV1/FVC RATIO: 82 %
POST FEV6/FVC RATIO: 100 %
PRE FEV1/FVC RATIO: 84 %
Pre FEV6/FVC Ratio: 100 %
RV % pred: 85 %
RV: 1.81 L
TLC % PRED: 71 %
TLC: 4.72 L

## 2016-07-20 ENCOUNTER — Ambulatory Visit (INDEPENDENT_AMBULATORY_CARE_PROVIDER_SITE_OTHER)
Admission: RE | Admit: 2016-07-20 | Discharge: 2016-07-20 | Disposition: A | Discharge status: Home / Self Care | Payer: Federal, State, Local not specified - PPO | Source: Ambulatory Visit | Attending: Acute Care | Admitting: Acute Care

## 2016-07-20 ENCOUNTER — Ambulatory Visit (INDEPENDENT_AMBULATORY_CARE_PROVIDER_SITE_OTHER): Payer: Federal, State, Local not specified - PPO | Admitting: Acute Care

## 2016-07-20 ENCOUNTER — Encounter: Payer: Self-pay | Admitting: Acute Care

## 2016-07-20 DIAGNOSIS — F17210 Nicotine dependence, cigarettes, uncomplicated: Secondary | ICD-10-CM

## 2016-07-20 DIAGNOSIS — Z87891 Personal history of nicotine dependence: Secondary | ICD-10-CM | POA: Diagnosis not present

## 2016-07-20 DIAGNOSIS — F17200 Nicotine dependence, unspecified, uncomplicated: Secondary | ICD-10-CM | POA: Diagnosis not present

## 2016-07-20 NOTE — Progress Notes (Signed)
Shared Decision Making Visit Lung Cancer Screening Program [phone])   Eligibility:  Age 60 y.o.  Pack Years Smoking History Calculation 38.5 pack years smoking history (# packs/per year x # years smoked)  Recent History of coughing up blood  no  Unexplained weight loss? no ( >Than 15 pounds within the last 6 months )  Prior History Lung / other cancer no (Diagnosis within the last 5 years already requiring surveillance chest CT Scans).  Smoking Status Current Smoker  Former Smokers: Years since quit: NA  Quit Date: NA  Visit Components:  Discussion included one or more decision making aids. yes  Discussion included risk/benefits of screening. yes  Discussion included potential follow up diagnostic testing for abnormal scans. yes  Discussion included meaning and risk of over diagnosis. yes  Discussion included meaning and risk of False Positives. yes  Discussion included meaning of total radiation exposure. yes  Counseling Included:  Importance of adherence to annual lung cancer LDCT screening. yes  Impact of comorbidities on ability to participate in the program. yes  Ability and willingness to under diagnostic treatment. yes  Smoking Cessation Counseling:  Current Smokers:   Discussed importance of smoking cessation. yes  Information about tobacco cessation classes and interventions provided to patient. yes  Patient provided with "ticket" for LDCT Scan. yes  Symptomatic Patient. no  Counseling NA  Diagnosis Code: Tobacco Use Z72.0  Asymptomatic Patient yes  Counseling (Intermediate counseling: > three minutes counseling) [id number]  Former Smokers:   Discussed the importance of maintaining cigarette abstinence. yes  Diagnosis Code: Personal History of Nicotine Dependence. [id number]  Information about tobacco cessation classes and interventions provided to patient. Yes  Patient provided with "ticket" for LDCT Scan. yes  Written Order for Lung Cancer  Screening with LDCT placed in Epic. Yes (CT Chest Lung Cancer Screening Low Dose W/O CM) [id number] Z12.2-Screening of respiratory organs Z87.891-Personal history of nicotine dependence  Donald Jennings have spent 20 minutes of face to face time with Donald Jennings discussing the risks and benefits of lung cancer screening. We viewed a power point together that explained in detail the above noted topics. We paused at intervals to allow for questions to be asked and answered to ensure understanding.We discussed that the single most powerful action that he can take to decrease his risk of developing lung cancer is to quit smoking. We discussed whether or not he is ready to commit to setting a quit date. He is not ready to set a quit date but is thinking about using Chantix. We discussed options for tools to aid in quitting smoking including nicotine replacement therapy, non-nicotine medications, support groups, Quit Smart classes, and behavior modification. We discussed that often times setting smaller, more achievable goals, such as eliminating 1 cigarette a day for a week and then 2 cigarettes a day for a week can be helpful in slowly decreasing the number of cigarettes smoked. This allows for a sense of accomplishment as well as providing a clinical benefit. Donald Jennings gave him the " Be Stronger Than Your Excuses" card with contact information for community resources, classes, free nicotine replacement therapy, and access to mobile apps, text messaging, and on-line smoking cessation help. Donald Jennings have also given him my card and contact information in the event he needs to contact me. We discussed the time and location of the scan, and that either Donald Jennings, Donald Jennings, or Donald Jennings will call with the results within 24-48 hours of receiving them. Donald Jennings have provided him with a copy of  the power point we viewed  as a resource in the event they need reinforcement of the concepts we discussed today in the office. The patient verbalized understanding of all of  the  above and had no further questions upon leaving the office. They have my contact information in the event they have any further questions.   Magdalen Spatz, NP  07/20/2016

## 2016-07-21 ENCOUNTER — Telehealth: Payer: Self-pay | Admitting: Acute Care

## 2016-07-21 DIAGNOSIS — F17210 Nicotine dependence, cigarettes, uncomplicated: Secondary | ICD-10-CM

## 2016-07-21 NOTE — Telephone Encounter (Signed)
I have called the results of Dr. Trilby Leaver Loeb's low-dose CT screening scan to his wife Alinda Dooms per Mr. Zerkel request. I explained to her that his scan was read as a Lung RADS 1, negative study: no nodules or definitely benign nodules. Radiology recommendation is for a repeat LDCT in 12 months. I also explained to her that the scan revealed coronary artery calcification.  Mr. Talati is currently taking Crestor 40 mg daily per his primary care physician. I explained to the patient's wife that I will fax a copy of the results to the patient's primary care doctor Dr. Scarlette Calico and that I will have him follow-up as he feels is clinically indicated as he knows this patient's history well. Mr. Shaak wife verbalized understanding of the above and had no further questions. Mr. Jarosz has my contact information in the event he has any further questions in the future.

## 2016-07-29 ENCOUNTER — Ambulatory Visit (INDEPENDENT_AMBULATORY_CARE_PROVIDER_SITE_OTHER): Payer: Federal, State, Local not specified - PPO | Admitting: Internal Medicine

## 2016-07-29 ENCOUNTER — Encounter: Payer: Self-pay | Admitting: Internal Medicine

## 2016-07-29 VITALS — BP 112/52 | HR 76 | Ht 67.32 in | Wt 162.5 lb

## 2016-07-29 DIAGNOSIS — R1310 Dysphagia, unspecified: Secondary | ICD-10-CM

## 2016-07-29 DIAGNOSIS — K219 Gastro-esophageal reflux disease without esophagitis: Secondary | ICD-10-CM | POA: Diagnosis not present

## 2016-07-29 DIAGNOSIS — Z720 Tobacco use: Secondary | ICD-10-CM | POA: Diagnosis not present

## 2016-07-29 DIAGNOSIS — R1110 Vomiting, unspecified: Secondary | ICD-10-CM

## 2016-07-29 DIAGNOSIS — F17200 Nicotine dependence, unspecified, uncomplicated: Secondary | ICD-10-CM

## 2016-07-29 NOTE — Progress Notes (Signed)
  Referred by Janith Lima, MD [address], [place] [address]   Assessment & Plan:   Encounter Diagnoses  Name Primary?  Donald Jennings Dysphagia Yes  . Regurgitation of food   . Gastroesophageal reflux disease, esophagitis presence not specified   Smoker  Diff DX:  GERD and stricture, motility disturbance - both Cancer also possible seems unlikely though actually has lost some weight  Plan EGD and possible dilation of esophagus. The risks and benefits as well as alternatives of endoscopic procedure(s) have been discussed and reviewed. All questions answered. The patient agrees to proceed.  Could need barium or manometric studies and he was told. Counseled to quit smoking    Subjective:    Patient ID: Donald Jennings, male    DOB: 1956-02-07, 60 y.o.   MRN: [id number] Cc: swallowing problems HPI Very nice 60 yo married man from [place] originally, having intermittent solid and liquid dysphagia. Seems bothered mostly by liquids. Sxs x 5-6 yrs. Can awaken at night and regurgitate food. Describes a suprasternal sticking point. Has been on omeprazole for reflux since 1991. Other sxs include chest pain w/ food impaction and bloating. No weight loss reported. All other GI sxs neg except mild constipation.  Hx amaurosis fugax last yr - neg stroke evaluation except vertebral artery stenosis  He is a smoker and has 2 ceffeien beverages/day Medications, allergies, past medical history, past surgical history, family history and social history are reviewed and updated in the EMR.  Review of Systems Itching around neck for a few weeks Right shldr painback pain Calf pain All other ROS negative or as per HPI    Objective:   Physical Exam @BP  (!) 112/52 (BP Location: Left Arm, Patient Position: Sitting, Cuff Size: Normal)   Pulse 76   Ht 5' 7.32" (1.71 m) Comment: height measured without shoes  Wt 162 lb 8 oz (73.7 kg)   BMI 25.21 kg/m @  General:  Well-developed,  well-nourished and in no acute distress Eyes:  anicteric. ENT:   Mouth and posterior pharynx free of lesions.  Neck:   supple w/o thyromegaly or mass.  Lungs: Clear to auscultation bilaterally. Heart:  S1S2, no rubs, murmurs, gallops. Abdomen:  soft, non-tender, no hepatosplenomegaly, hernia, or mass and BS+.  Lymph:  no cervical or supraclavicular adenopathy. Extremities:   no edema, cyanosis or clubbing Skin   no rash. Neuro:  A&O x 3.  Psych:  appropriate mood and  Affect.   Data Reviewed: PCP notes 2017 Wt Readings from Last 3 Encounters:  07/29/16 162 lb 8 oz (73.7 kg)  06/10/16 163 lb 9.6 oz (74.2 kg)  06/03/16 168 lb 4 oz (76.3 kg)   Negative screening colonoscopy 2016 Dr. Benson [place]    I appreciate the opportunity to care for this patient. [id number] Donald Ramp, MD

## 2016-07-29 NOTE — Patient Instructions (Signed)

## 2016-08-01 ENCOUNTER — Encounter: Payer: Self-pay | Admitting: Internal Medicine

## 2016-08-14 ENCOUNTER — Other Ambulatory Visit: Payer: Federal, State, Local not specified - PPO | Admitting: *Deleted

## 2016-08-14 DIAGNOSIS — Z79899 Other long term (current) drug therapy: Secondary | ICD-10-CM | POA: Diagnosis not present

## 2016-08-14 DIAGNOSIS — E785 Hyperlipidemia, unspecified: Secondary | ICD-10-CM | POA: Diagnosis not present

## 2016-08-14 LAB — LIPID PANEL
Cholesterol: 93 mg/dL — ABNORMAL LOW (ref 125–200)
HDL: 32 mg/dL — ABNORMAL LOW (ref >=40)
LDL CALC: 36 mg/dL (ref <130)
Total CHOL/HDL Ratio: 2.9 Ratio (ref <=5.0)
Triglycerides: 126 mg/dL (ref <150)
VLDL: 25 mg/dL (ref <30)

## 2016-08-14 LAB — ALT: ALT: 14 U/L (ref 9–46)

## 2016-08-17 ENCOUNTER — Encounter: Payer: Self-pay | Admitting: Internal Medicine

## 2016-08-31 ENCOUNTER — Encounter: Payer: Self-pay | Admitting: Internal Medicine

## 2016-08-31 ENCOUNTER — Ambulatory Visit (AMBULATORY_SURGERY_CENTER): Payer: Federal, State, Local not specified - PPO | Admitting: Internal Medicine

## 2016-08-31 VITALS — BP 100/54 | HR 66 | Temp 97.7°F | Resp 18 | Ht 67.0 in | Wt 162.0 lb

## 2016-08-31 DIAGNOSIS — K222 Esophageal obstruction: Secondary | ICD-10-CM

## 2016-08-31 DIAGNOSIS — R1310 Dysphagia, unspecified: Secondary | ICD-10-CM | POA: Diagnosis present

## 2016-08-31 MED ORDER — SODIUM CHLORIDE 0.9 % IV SOLN: 500.0000 mL | INTRAVENOUS | Status: DC

## 2016-08-31 NOTE — Patient Instructions (Addendum)
I dilated the esophagus to see if that helps you swallow better.  If it does not fix your problem you will need a test called an esophageal manometry to evaluate the motility or movement of the esophagus.  If not doing well in a month please call back and let me know.  I appreciate the opportunity to care for you. Gatha Mayer, MD, [facility]    YOU HAD AN ENDOSCOPIC PROCEDURE TODAY AT [facility]:   Refer to the procedure report that was given to you for any specific questions about what was found during the examination.  If the procedure report does not answer your questions, please call your gastroenterologist to clarify.  If you requested that your care partner not be given the details of your procedure findings, then the procedure report has been included in a sealed envelope for you to review at your convenience later.  YOU SHOULD EXPECT: Some feelings of bloating in the abdomen. Passage of more gas than usual.  Walking can help get rid of the air that was put into your GI tract during the procedure and reduce the bloating. If you had a lower endoscopy (such as a colonoscopy or flexible sigmoidoscopy) you may notice spotting of blood in your stool or on the toilet paper. If you underwent a bowel prep for your procedure, you may not have a normal bowel movement for a few days.  Please Note:  You might notice some irritation and congestion in your nose or some drainage.  This is from the oxygen used during your procedure.  There is no need for concern and it should clear up in a day or so.  SYMPTOMS TO REPORT IMMEDIATELY:   Following lower endoscopy (colonoscopy or flexible sigmoidoscopy):  Excessive amounts of blood in the stool  Significant tenderness or worsening of abdominal pains  Swelling of the abdomen that is new, acute  Fever of 100F or higher   Following upper endoscopy (EGD)  Vomiting of blood or coffee ground material  New chest pain or pain under  the shoulder blades  Painful or persistently difficult swallowing  New shortness of breath  Fever of 100F or higher  Black, tarry-looking stools  For urgent or emergent issues, a gastroenterologist can be reached at any hour by calling [phone].   DIET:  Please follow the dilatation diet the rest of the day.  A handout was provided.  Drink plenty of fluids but you should avoid alcoholic beverages for 24 hours.  ACTIVITY:  You should plan to take it easy for the rest of today and you should NOT DRIVE or use heavy machinery until tomorrow (because of the sedation medicines used during the test).    FOLLOW UP: Our staff will call the number listed on your records the next business day following your procedure to check on you and address any questions or concerns that you may have regarding the information given to you following your procedure. If we do not reach you, we will leave a message.  However, if you are feeling well and you are not experiencing any problems, there is no need to return our call.  We will assume that you have returned to your regular daily activities without incident.  If any biopsies were taken you will be contacted by phone or by letter within the next 1-3 weeks.  Please call us at [phone] if you have not heard about the biopsies in 3 weeks.  SIGNATURES/CONFIDENTIALITY: You and/or your care partner have signed paperwork which will be entered into your electronic medical record.  These signatures attest to the fact that that the information above on your After Visit Summary has been reviewed and is understood.  Full responsibility of the confidentiality of this discharge information lies with you and/or your care-partner.   Handouts were given to your care partner on the dilatation diet to follow the rest of the day.  A handout was provided. You may resume your current medications today. If the dilatation does not help your dysphagia, then we would  schedule an esophageal manometry to evaluate the motilitiy of the esophagus. Please call if any questions or concerns.

## 2016-08-31 NOTE — Op Note (Signed)
[facility] Patient Name: Donald Jennings Procedure Date: 08/31/2016 10:09 AM MRN: [id number] Endoscopist: Gatha Mayer , MD Age: 60 Referring MD:  Date of Birth: Aug 27, 1956 Gender: Male Account #: [id number] Procedure:                Upper GI endoscopy Indications:              Dysphagia Medicines:                Propofol per Anesthesia, Monitored Anesthesia Care Procedure:                Pre-Anesthesia Assessment:                           - Prior to the procedure, a History and Physical                            was performed, and patient medications and                            allergies were reviewed. [name] tolerance of                            previous anesthesia was also reviewed. The risks                            and benefits of the procedure and the sedation                            options and risks were discussed with the patient.                            All questions were answered, and informed consent                            was obtained. Prior Anticoagulants: The patient                            last took aspirin 1 day prior to the procedure. ASA                            Grade Assessment: III - A patient with severe                            systemic disease. After reviewing the risks and                            benefits, the patient was deemed in satisfactory                            condition to undergo the procedure.                           After obtaining informed consent, the endoscope was  passed under direct vision. Throughout the                            procedure, the patient's blood pressure, pulse, and                            oxygen saturations were monitored continuously. The                            Model GIF-[id number] [id number]) scope was introduced                            through the mouth, and advanced to the second part                            of duodenum. The upper GI  endoscopy was                            accomplished without difficulty. The patient                            tolerated the procedure well. Scope In: Scope Out: Findings:                 The examined esophagus was mildly tortuous.                           One mild benign-appearing, intrinsic stenosis was                            found at the gastroesophageal junction. And was                            traversed. The scope was withdrawn. Dilation was                            performed with a Maloney dilator with no resistance                            at 71 Fr. The dilation site was examined following                            endoscope reinsertion and showed no change.                            Estimated blood loss: none.                           A small amount of food (residue) was found in the                            gastric body.                           The exam was otherwise without abnormality.  The cardia and gastric fundus were normal on                            retroflexion. Complications:            No immediate complications. Estimated Blood Loss:     Estimated blood loss: none. Impression:               - Tortuous esophagus.                           - Benign-appearing esophageal stenosis. Dilated.                           - A small amount of food (residue) in the stomach.                           - The examination was otherwise normal.                           - No specimens collected. Recommendation:           - Patient has a contact number available for                            emergencies. The signs and symptoms of potential                            delayed complications were discussed with the                            patient. Return to normal activities tomorrow.                            Written discharge instructions were provided to the                            patient.                           - Clear  liquids x 1 hour then soft foods rest of                            day. Start prior diet tomorrow.                           - Continue present medications.                           -                           IF THIS DILATION DOES NOT RELIEVE DYSPHAGIA THEN                            WOULD DO AN ESOPHAGEAL MANOMETRY TO EVALUATE AS  THERE IS SOME SUGGESTION OF MOTILITY DISTURBANCE                            (TORTUOUS ESOPHAGUS AND MILD STENOSIS W/O DISCRETE                            STRICTURE GE JUNCTION). Gatha Mayer, MD 08/31/2016 10:33:17 AM This report has been signed electronically.

## 2016-08-31 NOTE — Progress Notes (Signed)
No problems noted in the recovery room. maw 

## 2016-08-31 NOTE — Progress Notes (Signed)
Report given to PACU RN, vss 

## 2016-08-31 NOTE — Progress Notes (Signed)
Called to room to assist during endoscopic procedure.  Patient ID and intended procedure confirmed with present staff. Received instructions for my participation in the procedure from the performing physician.  

## 2016-09-01 ENCOUNTER — Telehealth: Payer: Self-pay

## 2016-09-01 NOTE — Telephone Encounter (Signed)
  Follow up Call-  Call back number 08/31/2016  Post procedure Call Back phone  # [phone] cell ok to speak with wife.  Permission to leave phone message Yes  Some recent data might be hidden    Patient was called for follow up after his procedure on 08/31/2016. I spoke with the patients wife Donald Jennings and she reports that Donald Jennings has returned to his normal daily activities without any difficulty.

## 2016-09-04 ENCOUNTER — Ambulatory Visit: Payer: Federal, State, Local not specified - PPO | Admitting: Neurology

## 2016-11-08 ENCOUNTER — Other Ambulatory Visit: Payer: Self-pay | Admitting: Internal Medicine

## 2016-11-08 DIAGNOSIS — I10 Essential (primary) hypertension: Secondary | ICD-10-CM

## 2016-11-09 ENCOUNTER — Telehealth: Payer: Self-pay | Admitting: Internal Medicine

## 2016-11-09 NOTE — Telephone Encounter (Signed)
Patient is going to [place] on Friday.  Patient would like to know what vaccinations he needs to get and if he can come in to get those before Friday.

## 2016-11-09 NOTE — Telephone Encounter (Signed)
Please advise 

## 2016-11-13 ENCOUNTER — Encounter: Payer: Federal, State, Local not specified - PPO | Admitting: Internal Medicine

## 2016-11-30 HISTORY — PX: CARDIAC CATHETERIZATION: SHX172

## 2017-02-17 ENCOUNTER — Observation Stay (HOSPITAL_COMMUNITY)
Admission: EM | Admit: 2017-02-17 | Discharge: 2017-02-18 | Disposition: A | Discharge status: Home / Self Care | Payer: Federal, State, Local not specified - PPO | Attending: Internal Medicine | Admitting: Internal Medicine

## 2017-02-17 ENCOUNTER — Encounter (HOSPITAL_COMMUNITY): Payer: Self-pay | Admitting: Emergency Medicine

## 2017-02-17 ENCOUNTER — Emergency Department (HOSPITAL_COMMUNITY): Payer: Federal, State, Local not specified - PPO

## 2017-02-17 DIAGNOSIS — R937 Abnormal findings on diagnostic imaging of other parts of musculoskeletal system: Secondary | ICD-10-CM

## 2017-02-17 DIAGNOSIS — I10 Essential (primary) hypertension: Secondary | ICD-10-CM | POA: Diagnosis present

## 2017-02-17 DIAGNOSIS — Z833 Family history of diabetes mellitus: Secondary | ICD-10-CM | POA: Insufficient documentation

## 2017-02-17 DIAGNOSIS — M62830 Muscle spasm of back: Secondary | ICD-10-CM | POA: Diagnosis not present

## 2017-02-17 DIAGNOSIS — R0789 Other chest pain: Principal | ICD-10-CM | POA: Insufficient documentation

## 2017-02-17 DIAGNOSIS — M549 Dorsalgia, unspecified: Secondary | ICD-10-CM | POA: Diagnosis not present

## 2017-02-17 DIAGNOSIS — K219 Gastro-esophageal reflux disease without esophagitis: Secondary | ICD-10-CM | POA: Insufficient documentation

## 2017-02-17 DIAGNOSIS — I2510 Atherosclerotic heart disease of native coronary artery without angina pectoris: Secondary | ICD-10-CM | POA: Insufficient documentation

## 2017-02-17 DIAGNOSIS — Z8249 Family history of ischemic heart disease and other diseases of the circulatory system: Secondary | ICD-10-CM | POA: Insufficient documentation

## 2017-02-17 DIAGNOSIS — Z7982 Long term (current) use of aspirin: Secondary | ICD-10-CM | POA: Insufficient documentation

## 2017-02-17 DIAGNOSIS — M5124 Other intervertebral disc displacement, thoracic region: Secondary | ICD-10-CM | POA: Diagnosis not present

## 2017-02-17 DIAGNOSIS — Z823 Family history of stroke: Secondary | ICD-10-CM | POA: Diagnosis not present

## 2017-02-17 DIAGNOSIS — E559 Vitamin D deficiency, unspecified: Secondary | ICD-10-CM | POA: Diagnosis not present

## 2017-02-17 DIAGNOSIS — F17210 Nicotine dependence, cigarettes, uncomplicated: Secondary | ICD-10-CM | POA: Diagnosis not present

## 2017-02-17 DIAGNOSIS — F329 Major depressive disorder, single episode, unspecified: Secondary | ICD-10-CM | POA: Diagnosis not present

## 2017-02-17 DIAGNOSIS — E1142 Type 2 diabetes mellitus with diabetic polyneuropathy: Secondary | ICD-10-CM | POA: Diagnosis not present

## 2017-02-17 DIAGNOSIS — R079 Chest pain, unspecified: Secondary | ICD-10-CM | POA: Diagnosis present

## 2017-02-17 DIAGNOSIS — E785 Hyperlipidemia, unspecified: Secondary | ICD-10-CM | POA: Diagnosis present

## 2017-02-17 LAB — BASIC METABOLIC PANEL
Anion gap: 3 — ABNORMAL LOW (ref 5–15)
BUN: 18 mg/dL (ref 6–20)
CALCIUM: 8.9 mg/dL (ref 8.9–10.3)
CO2: 27 mmol/L (ref 22–32)
Chloride: 109 mmol/L (ref 101–111)
Creatinine, Ser: 0.79 mg/dL (ref 0.61–1.24)
Glucose, Bld: 133 mg/dL — ABNORMAL HIGH (ref 65–99)
Potassium: 3.6 mmol/L (ref 3.5–5.1)
SODIUM: 139 mmol/L (ref 135–145)

## 2017-02-17 LAB — CBC
HCT: 38.4 % — ABNORMAL LOW (ref 39.0–52.0)
Hemoglobin: 12.6 g/dL — ABNORMAL LOW (ref 13.0–17.0)
MCH: 27.9 pg (ref 26.0–34.0)
MCHC: 32.8 g/dL (ref 30.0–36.0)
MCV: 85.1 fL (ref 78.0–100.0)
PLATELETS: 217 10*3/uL (ref 150–400)
RBC: 4.51 MIL/uL (ref 4.22–5.81)
RDW: 13.9 % (ref 11.5–15.5)
WBC: 6.9 10*3/uL (ref 4.0–10.5)

## 2017-02-17 LAB — I-STAT TROPONIN, ED: TROPONIN I, POC: 0.01 ng/mL (ref 0.00–0.08)

## 2017-02-17 MED ORDER — LOSARTAN POTASSIUM 50 MG PO TABS
100.0000 mg | ORAL_TABLET | Freq: Every day | ORAL | Status: DC
  Administered 2017-02-18: 100 mg via ORAL
  Filled 2017-02-17: qty 2

## 2017-02-17 MED ORDER — METOPROLOL SUCCINATE ER 25 MG PO TB24
25.0000 mg | ORAL_TABLET | Freq: Every day | ORAL | Status: DC
  Administered 2017-02-18: 25 mg via ORAL
  Filled 2017-02-17: qty 1

## 2017-02-17 MED ORDER — ONDANSETRON HCL 4 MG/2ML IJ SOLN: 4.0000 mg | Freq: Four times a day (QID) | INTRAMUSCULAR | Status: DC | PRN

## 2017-02-17 MED ORDER — ROSUVASTATIN CALCIUM 40 MG PO TABS
40.0000 mg | ORAL_TABLET | Freq: Every day | ORAL | Status: DC
  Administered 2017-02-17 – 2017-02-18 (×2): 40 mg via ORAL
  Filled 2017-02-17 (×2): qty 4
  Filled 2017-02-17: qty 1

## 2017-02-17 MED ORDER — ADULT MULTIVITAMIN W/MINERALS CH
1.0000 | ORAL_TABLET | Freq: Every day | ORAL | Status: DC
  Administered 2017-02-17 – 2017-02-18 (×2): 1 via ORAL
  Filled 2017-02-17 (×2): qty 1

## 2017-02-17 MED ORDER — ASPIRIN EC 81 MG PO TBEC
81.0000 mg | DELAYED_RELEASE_TABLET | Freq: Every day | ORAL | Status: DC
  Administered 2017-02-17 – 2017-02-18 (×2): 81 mg via ORAL
  Filled 2017-02-17 (×2): qty 1

## 2017-02-17 MED ORDER — ACETAMINOPHEN 325 MG PO TABS
650.0000 mg | ORAL_TABLET | ORAL | Status: DC | PRN
  Administered 2017-02-17 – 2017-02-18 (×2): 650 mg via ORAL
  Filled 2017-02-17 (×2): qty 2

## 2017-02-17 MED ORDER — NITROGLYCERIN 2 % TD OINT
1.0000 [in_us] | TOPICAL_OINTMENT | Freq: Once | TRANSDERMAL | Status: AC
  Administered 2017-02-17: 1 [in_us] via TOPICAL
  Filled 2017-02-17: qty 1

## 2017-02-17 MED ORDER — ENOXAPARIN SODIUM 40 MG/0.4ML ~~LOC~~ SOLN
40.0000 mg | Freq: Every day | SUBCUTANEOUS | Status: DC
  Administered 2017-02-18: 40 mg via SUBCUTANEOUS
  Filled 2017-02-17: qty 0.4

## 2017-02-17 MED ORDER — PANTOPRAZOLE SODIUM 40 MG PO TBEC
40.0000 mg | DELAYED_RELEASE_TABLET | Freq: Every day | ORAL | Status: DC
  Administered 2017-02-17 – 2017-02-18 (×2): 40 mg via ORAL
  Filled 2017-02-17 (×2): qty 1

## 2017-02-17 NOTE — ED Provider Notes (Signed)
[place] DEPT Provider Note   CSN: [id number] Arrival date & time: 02/17/17  1752     History   Chief Complaint Chief Complaint  Patient presents with  . Chest Pain  . Back Pain    HPI Donald Jennings is a 61 y.o. male.  The history is provided by the patient.  Chest Pain   This is a recurrent problem. The current episode started 6 to 12 hours ago. The problem occurs constantly. Progression since onset: fluctuating. The pain is present in the lateral region (left pectoral). The pain is mild. The quality of the pain is described as dull. The pain does not radiate. Exacerbated by: nothing. Associated symptoms include back pain. Pertinent negatives include no cough, no diaphoresis, no fever, no lower extremity edema, no malaise/fatigue, no nausea, no palpitations and no shortness of breath. Risk factors include smoking/tobacco exposure.  His past medical history is significant for diabetes, hyperlipidemia and hypertension.  Pertinent negatives for past medical history include no DVT, no MI and no PE.  Procedure history is positive for exercise treadmill test (negative as of last year per pt. on review, TWI noted during stress test. CTA with coronary atherosclerosis).  Procedure history is negative for cardiac catheterization.  Back Pain   This is a recurrent problem. Episode onset: 2 weeks. The problem occurs constantly. Progression since onset: fluctuating. The pain is associated with twisting. The pain is present in the thoracic spine. The quality of the pain is described as aching. Associated symptoms include chest pain. Pertinent negatives include no fever.    Past Medical History:  Diagnosis Date  . CAD (coronary artery disease)   . CERVICAL RADICULOPATHY 07/02/2010   Qualifier: Diagnosis of  By: Ronnald Ramp MD, Arvid Right.   . DEPRESSION 03/21/2009   Qualifier: Diagnosis of  By: Ronnald Ramp MD, Arvid Right.   . Diabetes mellitus without complication ([place])   . GERD 06/19/2009   Qualifier:  Diagnosis of  By: Ronnald Ramp MD, Arvid Right.   [name] HLD (hyperlipidemia)   . Hx of cardiovascular stress test    ETT-Myoview (03/2014):  No ischemia, EF 56%; Low Risk  . HYPERTENSION 06/19/2009   Qualifier: Diagnosis of  By: Ronnald Ramp MD, [place], RIGHT 07/02/2010   Qualifier: Diagnosis of  By: Ronnald Ramp MD, Arvid Right.   . SOB (shortness of breath) 06/10/2016  . TOBACCO USE 03/21/2009   Qualifier: Diagnosis of  By: Ronnald Ramp MD, Arvid Right.     Patient Active Problem List   Diagnosis Date Noted  . Vertebral artery stenosis, asymptomatic 06/22/2016  . SOB (shortness of breath) 06/10/2016  . Amaurosis fugax 06/03/2016  . Hyperlipidemia with target LDL less than 100 06/03/2016  . Abnormal thallium stress test 05/20/2016  . Dysphagia 05/18/2016  . DOE (dyspnea on exertion) 05/18/2016  . Neuropathy of both feet 05/28/2015  . Eczema, dyshidrotic 05/27/2015  . Type 2 diabetes mellitus with manifestations ([place]) 03/27/2015  . Routine general medical examination at a health care facility 04/04/2013  . Vitamin D deficiency 04/04/2013  . Essential hypertension 06/19/2009  . GERD 06/19/2009  . TOBACCO USE 03/21/2009  . ELECTROCARDIOGRAM, ABNORMAL 03/21/2009    Past Surgical History:  Procedure Laterality Date  . CATARACT EXTRACTION Bilateral    Left 2009 at [facility], Right 2004 in [place]  . COLONOSCOPY  07/25/2015   Dr Carol Ada  . INGUINAL HERNIA REPAIR Right 1986       Home Medications    Prior to Admission medications  Medication Sig Start Date End Date Taking? Authorizing Provider  aspirin EC 81 MG tablet Take 1 tablet (81 mg total) by mouth daily. 06/03/16   Janith Lima, MD  clobetasol cream (TEMOVATE) 9.02 % Apply 1 application topically 2 (two) times daily. 05/27/15   Janith Lima, MD  diclofenac sodium (VOLTAREN) 1 % GEL Apply 2 g topically 4 (four) times daily. 03/27/15   Janith Lima, MD  ergocalciferol (DRISDOL) 50000 UNITS capsule Take 1 capsule (50,000 Units  total) by mouth once a week. 05/27/15   Janith Lima, MD  Flaxseed, Linseed, (FLAX SEED OIL PO) Take by mouth as directed.    Historical Provider, MD  losartan (COZAAR) 100 MG tablet Take 1 tablet (100 mg total) by mouth daily. 05/18/16   Janith Lima, MD  metoprolol succinate (TOPROL-XL) 25 MG 24 hr tablet TAKE 1 TABLET (25 MG TOTAL) BY MOUTH DAILY. 11/08/16   Janith Lima, MD  nitroGLYCERIN (NITROSTAT) 0.4 MG SL tablet Place 1 tablet (0.4 mg total) under the tongue every 5 (five) minutes as needed for chest pain. Patient not taking: Reported on 08/31/2016 06/03/16   Janith Lima, MD  omeprazole (PRILOSEC OTC) 20 MG tablet Take 20 mg by mouth daily.    Historical Provider, MD  rosuvastatin (CRESTOR) 40 MG tablet Take 1 tablet (40 mg total) by mouth daily. 06/03/16   Janith Lima, MD  triamcinolone (KENALOG) 0.025 % cream Reported on 05/18/2016 03/27/15   Historical Provider, MD    Family History Family History  Problem Relation Age of Onset  . Diabetes Mother   . Arthritis Mother   . Heart disease Father   . Stroke Father   . Stroke Brother   . Cancer Neg Hx   . Early death Neg Hx   . Hypertension Neg Hx   . Hyperlipidemia Neg Hx   . Kidney disease Neg Hx     Social History Social History  Substance Use Topics  . Smoking status: Current Every Day Smoker    Packs/day: 1.00    Years: 38.50    Types: Cigarettes  . Smokeless tobacco: Never Used     Comment: Currently  smokes 1/2 ppd and is considering Chantix  . Alcohol use No     Allergies   Patient has no known allergies.   Review of Systems Review of Systems  Constitutional: Negative for diaphoresis, fever and malaise/fatigue.  Respiratory: Negative for cough and shortness of breath.   Cardiovascular: Positive for chest pain. Negative for palpitations.  Gastrointestinal: Negative for nausea.  Musculoskeletal: Positive for back pain.  Ten systems are reviewed and are negative for acute change except as noted in the  HPI    Physical Exam Updated Vital Signs BP 119/66 (BP Location: Left Arm)   Pulse 77   Temp 97.9 F (36.6 C) (Oral)   Resp 15   Ht 5\' 8"  (1.727 m)   Wt 161 lb (73 kg)   SpO2 98%   BMI 24.48 kg/m   Physical Exam  Constitutional: He is oriented to person, place, and time. He appears well-developed and well-nourished. No distress.  HENT:  Head: Normocephalic and atraumatic.  Nose: Nose normal.  Eyes: Conjunctivae and EOM are normal. Pupils are equal, round, and reactive to light. Right eye exhibits no discharge. Left eye exhibits no discharge. No scleral icterus.  Neck: Normal range of motion. Neck supple.  Cardiovascular: Normal rate and regular rhythm.  Exam reveals no gallop and no friction  rub.   No murmur heard. Pulmonary/Chest: Effort normal and breath sounds normal. No stridor. No respiratory distress. He has no rales. He exhibits no tenderness.  Abdominal: Soft. He exhibits no distension. There is no tenderness.  Musculoskeletal: He exhibits no edema.       Cervical back: He exhibits tenderness and spasm. He exhibits no bony tenderness.       Back:  Neurological: He is alert and oriented to person, place, and time.  Skin: Skin is warm and dry. No rash noted. He is not diaphoretic. No erythema.  Psychiatric: He has a normal mood and affect.  Vitals reviewed.    ED Treatments / Results  Labs (all labs ordered are listed, but only abnormal results are displayed) Labs Reviewed  [place], ED    EKG  EKG Interpretation  Date/Time:  Wednesday February 17 2017 18:03:50 EDT Ventricular Rate:  80 PR Interval:    QRS Duration: 110 QT Interval:  376 QTC Calculation: 434 R Axis:   87 Text Interpretation:  Sinus rhythm Anterior infarct, old No significant change since last tracing Confirmed by [facility] MD, Guillermina Shaft [phone]) on 02/17/2017 6:29:24 PM       Radiology Dg Chest 2 View  Result Date: 02/17/2017 CLINICAL DATA:  Left-sided  chest pain. EXAM: CHEST  2 VIEW COMPARISON:  Chest x-ray dated 05/18/2016. FINDINGS: Heart size and mediastinal contours are normal. Lungs are clear. No pleural effusion or pneumothorax seen. Osseous structures about the chest are unremarkable. IMPRESSION: No active cardiopulmonary disease. No evidence of pneumonia or pulmonary edema. Electronically Signed   By: Franki Cabot M.D.   On: 02/17/2017 18:49    Procedures Procedures (including critical care time)  Medications Ordered in ED Medications - No data to display   Initial Impression / Assessment and Plan / ED Course  I have reviewed the triage vital signs and the nursing notes.  Pertinent labs & imaging results that were available during my care of the patient were reviewed by me and considered in my medical decision making (see chart for details).     Chest pain in high risk patient for ACS. EKG without acute ischemic changes or evidence of pericarditis. Initial troponin negative. Chest x-ray without evidence suggestive of pneumonia, pneumothorax, pneumomediastinum.  No abnormal contour of the mediastinum to suggest dissection. No evidence of acute injuries. Low pretest probability for pulmonary embolism. Presentation not classic for aortic dissection or esophageal perforation.  Patient will be admitted to hospitalist for ACS rule out.   Back pain related to muscle spasm of the right parascapular muscles. No correlation with chest pain. Symptomatic management as needed.  Final Clinical Impressions(s) / ED Diagnoses   Final diagnoses:  Nonspecific chest pain  Back muscle spasm      Fatima Blank, MD 02/17/17 2145

## 2017-02-17 NOTE — ED Notes (Signed)
[facility] called and added pt to list.

## 2017-02-17 NOTE — H&P (Signed)
History and Physical    Donald Jennings OIB:[id number] DOB: 04-10-1956 DOA: 02/17/2017  PCP: Scarlette Calico, MD  Patient coming from: Home  I have personally briefly reviewed patient's old medical records in [place]  Chief Complaint: Chest pain  HPI: Donald Jennings is a 61 y.o. male with medical history significant of Amyrosis fugax, HTN, HLD, smoking.  Patient presents to the ED at [facility] hospital with c/o chest pain.  Pain located in L chest and back.  Onset this morning while at rest, has been waxing and waning since then.  Nothing seems to make pain worse.  In the past he has had chest pain both at rest, but more often with activity.  Patient has h/o stress test in June last year which was intermediate risk with lateral ST depressions and T wave inversions on exercise, but negative nuclear findings for ischemia.  Due to recent TIA cardiology didn't want to go straight to heart cath so CTA coronary was performed in July, calcium score of 43.  ED Course: CP improved with NTG paste.  EKG unchanged, Trop neg.   Review of Systems: As per HPI otherwise 10 point review of systems negative.   Past Medical History:  Diagnosis Date  . CAD (coronary artery disease)   . CERVICAL RADICULOPATHY 07/02/2010   Qualifier: Diagnosis of  By: Ronnald Ramp MD, Arvid Right.   . DEPRESSION 03/21/2009   Qualifier: Diagnosis of  By: Ronnald Ramp MD, Arvid Right.   . Diabetes mellitus without complication ([place])   . GERD 06/19/2009   Qualifier: Diagnosis of  By: Ronnald Ramp MD, Arvid Right.   [name] HLD (hyperlipidemia)   . Hx of cardiovascular stress test    ETT-Myoview (03/2014):  No ischemia, EF 56%; Low Risk  . HYPERTENSION 06/19/2009   Qualifier: Diagnosis of  By: Ronnald Ramp MD, [place], RIGHT 07/02/2010   Qualifier: Diagnosis of  By: Ronnald Ramp MD, Arvid Right.   . SOB (shortness of breath) 06/10/2016  . TOBACCO USE 03/21/2009   Qualifier: Diagnosis of  By: Ronnald Ramp MD, Arvid Right.     Past Surgical History:  Procedure Laterality  Date  . CATARACT EXTRACTION Bilateral    Left 2009 at [facility], Right 2004 in [place]  . COLONOSCOPY  07/25/2015   Dr Carol Ada  . INGUINAL HERNIA REPAIR Right 1986     reports that he has been smoking Cigarettes.  He has a 38.50 pack-year smoking history. He has never used smokeless tobacco. He reports that he does not drink alcohol or use drugs.  No Known Allergies  Family History  Problem Relation Age of Onset  . Diabetes Mother   . Arthritis Mother   . Heart disease Father   . Stroke Father   . Stroke Brother   . Cancer Neg Hx   . Early death Neg Hx   . Hypertension Neg Hx   . Hyperlipidemia Neg Hx   . Kidney disease Neg Hx      Prior to Admission medications   Medication Sig Start Date End Date Taking? Authorizing Provider  aspirin EC 81 MG tablet Take 1 tablet (81 mg total) by mouth daily. 06/03/16  Yes Janith Lima, MD  losartan (COZAAR) 100 MG tablet Take 1 tablet (100 mg total) by mouth daily. 05/18/16  Yes Janith Lima, MD  metoprolol succinate (TOPROL-XL) 25 MG 24 hr tablet TAKE 1 TABLET (25 MG TOTAL) BY MOUTH DAILY. 11/08/16  Yes Janith Lima, MD  Multiple Vitamins-Minerals (MULTIVITAMIN ADULT) TABS Take 1 tablet by mouth daily.   Yes Historical Provider, MD  omeprazole (PRILOSEC OTC) 20 MG tablet Take 20 mg by mouth daily.   Yes Historical Provider, MD  rosuvastatin (CRESTOR) 40 MG tablet Take 1 tablet (40 mg total) by mouth daily. 06/03/16  Yes Janith Lima, MD  ergocalciferol (DRISDOL) 50000 UNITS capsule Take 1 capsule (50,000 Units total) by mouth once a week. 05/27/15   Janith Lima, MD    Physical Exam: Vitals:   02/17/17 1803 02/17/17 1813  BP: 119/66   Pulse: 77   Resp: 15   Temp: 97.9 F (36.6 C)   TempSrc: Oral   SpO2: 98%   Weight:  73 kg (161 lb)  Height:  5\' 8"  (1.727 m)    Constitutional: NAD, calm, comfortable Eyes: PERRL, lids and conjunctivae normal ENMT: Mucous membranes are moist. Posterior pharynx clear of any exudate or  lesions.Normal dentition.  Neck: normal, supple, no masses, no thyromegaly Respiratory: clear to auscultation bilaterally, no wheezing, no crackles. Normal respiratory effort. No accessory muscle use.  Cardiovascular: Regular rate and rhythm, no murmurs / rubs / gallops. No extremity edema. 2+ pedal pulses. No carotid bruits.  Abdomen: no tenderness, no masses palpated. No hepatosplenomegaly. Bowel sounds positive.  Musculoskeletal: no clubbing / cyanosis. No joint deformity upper and lower extremities. Good ROM, no contractures. Normal muscle tone.  Skin: no rashes, lesions, ulcers. No induration Neurologic: CN 2-12 grossly intact. Sensation intact, DTR normal. Strength 5/5 in all 4.  Psychiatric: Normal judgment and insight. Alert and oriented x 3. Normal mood.    Labs on Admission: I have personally reviewed following labs and imaging studies  CBC:  Recent Labs Lab 02/17/17 1841  WBC 6.9  HGB 12.6*  HCT 38.4*  MCV 85.1  PLT 992   Basic Metabolic Panel:  Recent Labs Lab 02/17/17 1841  NA 139  K 3.6  CL 109  CO2 27  GLUCOSE 133*  BUN 18  CREATININE 0.79  CALCIUM 8.9   GFR: Estimated Creatinine Clearance: 96.2 mL/min (by C-G formula based on SCr of 0.79 mg/dL). Liver Function Tests: No results for input(s): AST, ALT, ALKPHOS, BILITOT, PROT, ALBUMIN in the last 168 hours. No results for input(s): LIPASE, AMYLASE in the last 168 hours. No results for input(s): AMMONIA in the last 168 hours. Coagulation Profile: No results for input(s): INR, PROTIME in the last 168 hours. Cardiac Enzymes: No results for input(s): CKTOTAL, CKMB, CKMBINDEX, TROPONINI in the last 168 hours. BNP (last 3 results) No results for input(s): PROBNP in the last 8760 hours. HbA1C: No results for input(s): HGBA1C in the last 72 hours. CBG: No results for input(s): GLUCAP in the last 168 hours. Lipid Profile: No results for input(s): CHOL, HDL, LDLCALC, TRIG, CHOLHDL, LDLDIRECT in the last 72  hours. Thyroid Function Tests: No results for input(s): TSH, T4TOTAL, FREET4, T3FREE, THYROIDAB in the last 72 hours. Anemia Panel: No results for input(s): VITAMINB12, FOLATE, FERRITIN, TIBC, IRON, RETICCTPCT in the last 72 hours. Urine analysis:    Component Value Date/Time   COLORURINE YELLOW 05/18/2016 1646   APPEARANCEUR CLEAR 05/18/2016 1646   LABSPEC 1.025 05/18/2016 1646   PHURINE 6.0 05/18/2016 1646   GLUCOSEU NEGATIVE 05/18/2016 1646   HGBUR NEGATIVE 05/18/2016 1646   BILIRUBINUR NEGATIVE 05/18/2016 1646   KETONESUR NEGATIVE 05/18/2016 1646   UROBILINOGEN 0.2 05/18/2016 1646   NITRITE NEGATIVE 05/18/2016 1646   LEUKOCYTESUR NEGATIVE 05/18/2016 1646    Radiological Exams on  Admission: Dg Chest 2 View  Result Date: 02/17/2017 CLINICAL DATA:  Left-sided chest pain. EXAM: CHEST  2 VIEW COMPARISON:  Chest x-ray dated 05/18/2016. FINDINGS: Heart size and mediastinal contours are normal. Lungs are clear. No pleural effusion or pneumothorax seen. Osseous structures about the chest are unremarkable. IMPRESSION: No active cardiopulmonary disease. No evidence of pneumonia or pulmonary edema. Electronically Signed   By: Franki Cabot M.D.   On: 02/17/2017 18:49    EKG: Independently reviewed.  Assessment/Plan Principal Problem:   Chest pain, rule out acute myocardial infarction Active Problems:   Essential hypertension   Hyperlipidemia with target LDL less than 100    1. CP - 1. CP obs pathway 2. Serial trops 3. NPO after midnight 4. Call cards in AM 2. HTN - continue home meds 3. HLD - continue statin  DVT prophylaxis: Lovenox Code Status: Full Family Communication: Wife at bedside Disposition Plan: Home after admit Consults called: None Admission status: Place in obs   [place] [place] [facility] Pager [phone]  If 7AM-7PM, please contact day team taking care of patient [email or web address] Password [email or web address]  02/17/2017, 8:02 PM

## 2017-02-17 NOTE — ED Notes (Signed)
Report given to [facility] from [place]

## 2017-02-17 NOTE — ED Notes (Signed)
Pt transported to Xray. 

## 2017-02-17 NOTE — ED Notes (Signed)
MD at bedside. 

## 2017-02-17 NOTE — ED Triage Notes (Signed)
Pt comes today with complaints of left sided chest pain that began this morning and has persisted. Also complains of back pain over the past 2 weeks that feels like pressure.  Called his doctor to get appointment for tomorrow but it is too late. Had an EKG and stress test done a few months ago which came out normal. A&O x4.  Ambulatory.

## 2017-02-17 NOTE — ED Notes (Signed)
[facility] arrived  

## 2017-02-18 ENCOUNTER — Observation Stay (HOSPITAL_COMMUNITY): Payer: Federal, State, Local not specified - PPO

## 2017-02-18 DIAGNOSIS — R937 Abnormal findings on diagnostic imaging of other parts of musculoskeletal system: Secondary | ICD-10-CM

## 2017-02-18 DIAGNOSIS — M546 Pain in thoracic spine: Secondary | ICD-10-CM

## 2017-02-18 DIAGNOSIS — E785 Hyperlipidemia, unspecified: Secondary | ICD-10-CM | POA: Diagnosis not present

## 2017-02-18 DIAGNOSIS — R0789 Other chest pain: Secondary | ICD-10-CM | POA: Diagnosis not present

## 2017-02-18 DIAGNOSIS — E1142 Type 2 diabetes mellitus with diabetic polyneuropathy: Secondary | ICD-10-CM | POA: Diagnosis not present

## 2017-02-18 DIAGNOSIS — R079 Chest pain, unspecified: Secondary | ICD-10-CM | POA: Diagnosis not present

## 2017-02-18 DIAGNOSIS — I10 Essential (primary) hypertension: Secondary | ICD-10-CM | POA: Diagnosis not present

## 2017-02-18 LAB — CBC WITH DIFFERENTIAL/PLATELET
Basophils Absolute: 0 10*3/uL (ref 0.0–0.1)
Basophils Relative: 0 %
EOS PCT: 2 %
Eosinophils Absolute: 0.2 10*3/uL (ref 0.0–0.7)
HCT: 41.2 % (ref 39.0–52.0)
Hemoglobin: 13.4 g/dL (ref 13.0–17.0)
LYMPHS ABS: 2.5 10*3/uL (ref 0.7–4.0)
LYMPHS PCT: 25 %
MCH: 28.2 pg (ref 26.0–34.0)
MCHC: 32.5 g/dL (ref 30.0–36.0)
MCV: 86.7 fL (ref 78.0–100.0)
MONO ABS: 0.7 10*3/uL (ref 0.1–1.0)
Monocytes Relative: 7 %
Neutro Abs: 6.9 10*3/uL (ref 1.7–7.7)
Neutrophils Relative %: 66 %
PLATELETS: 221 10*3/uL (ref 150–400)
RBC: 4.75 MIL/uL (ref 4.22–5.81)
RDW: 13.8 % (ref 11.5–15.5)
WBC: 10.3 10*3/uL (ref 4.0–10.5)

## 2017-02-18 LAB — COMPREHENSIVE METABOLIC PANEL
ALBUMIN: 3.8 g/dL (ref 3.5–5.0)
ALK PHOS: 62 U/L (ref 38–126)
ALT: 16 U/L — AB (ref 17–63)
AST: 20 U/L (ref 15–41)
Anion gap: 7 (ref 5–15)
BUN: 19 mg/dL (ref 6–20)
CALCIUM: 9.4 mg/dL (ref 8.9–10.3)
CO2: 28 mmol/L (ref 22–32)
Chloride: 106 mmol/L (ref 101–111)
Creatinine, Ser: 0.85 mg/dL (ref 0.61–1.24)
GFR calc Af Amer: >60 mL/min (ref >60)
GFR calc non Af Amer: >60 mL/min (ref >60)
GLUCOSE: 98 mg/dL (ref 65–99)
Potassium: 4.2 mmol/L (ref 3.5–5.1)
SODIUM: 141 mmol/L (ref 135–145)
Total Bilirubin: 0.4 mg/dL (ref 0.3–1.2)
Total Protein: 7.4 g/dL (ref 6.5–8.1)

## 2017-02-18 LAB — TROPONIN I
TROPONIN I: 0.03 ng/mL — AB (ref <0.03)
Troponin I: 0.03 ng/mL (ref <0.03)
Troponin I: 0.03 ng/mL (ref <0.03)

## 2017-02-18 LAB — PSA: PSA: 0.58 ng/mL (ref 0.00–4.00)

## 2017-02-18 LAB — C-REACTIVE PROTEIN: CRP: 1.1 mg/dL — ABNORMAL HIGH (ref <1.0)

## 2017-02-18 LAB — LACTATE DEHYDROGENASE: LDH: 149 U/L (ref 98–192)

## 2017-02-18 MED ORDER — GADOBENATE DIMEGLUMINE 529 MG/ML IV SOLN
15.0000 mL | Freq: Once | INTRAVENOUS | Status: AC
  Administered 2017-02-18: 15 mL via INTRAVENOUS

## 2017-02-18 NOTE — Progress Notes (Signed)
CRITICAL VALUE ALERT  Critical value received:  Troponin 0.03  Date of notification:  02/18/17  Time of notification:  4045  Critical value read back:Yes.    Nurse who received alert:  Dallie Piles. Wynetta Emery, RN  MD notified (1st page):  Raliegh Ip Schorr  Time of first page:  [phone]  MD notified (2nd page):  Time of second page:  Responding MD:  none  Time MD responded: none

## 2017-02-18 NOTE — Progress Notes (Signed)
Nitropaste removed from patient's left upper chest

## 2017-02-18 NOTE — Discharge Summary (Signed)
[facility]  Physician Discharge Summary   Patient ID: Donald Jennings MRN: [id number] DOB/AGE: 1956/08/31 61 y.o.  Admit date: 02/17/2017 Discharge date: 02/18/2017  PCP: Scarlette Calico, MD  DISCHARGE DIAGNOSES:  Principal Problem:   Chest pain, rule out acute myocardial infarction Active Problems:   Essential hypertension   Hyperlipidemia with target LDL less than 100   RECOMMENDATIONS FOR OUTPATIENT FOLLOW UP: 1. Please see below. Patient wants to pursue outpatient evaluation.   DISCHARGE CONDITION: fair  Diet recommendation: As before  Filed Weights   02/17/17 1813 02/17/17 2250  Weight: 73 kg (161 lb) 71.8 kg (158 lb 6.4 oz)    INITIAL HISTORY: 61 year old male with medical history significant of Amyrosis fugax, HTN, HLD, smoking. Doesn't in with complaints of chest pain as well as back pain. Pain would come about when he uses body position. Asian was hospitalized for further management.    HOSPITAL COURSE:   Chest pain EKG does not show any ischemic changes. Troponin not elevated. Previous records reviewed. He had an intermediate risk stress test last year. Seen by cardiology and underwent CT coronaries. Calcium score was only in the 40s. Current presentation appears to be more of a musculoskeletal issue. See below. I do not think he needs any further cardiac workup at this time.  Back pain/T-spine vertebral body lesions Patient does mention that he's had back pain for a few weeks. At times, seems to radiate to the front. Denies any lower extremely wekaness. He does have point tenderness over thoracic spine. MRI of the thoracic spine was done and showed lesions in T5, T6 and T8, concerning for metastatic disease or myeloma. Discussed with the radiologist. Contrast might better delineate these lesions. So thoracic spine MRI ordered this time with contrast. This confirms the findings. Results communicated to patient and wife. Patient denies any loss of weight  recently. Denies any blood in the stool or blood in the urine. He had a low-dose CT scan of lung did not show any concerning findings. This was last year. His colonoscopy in 2016 was also without any abnormalities. He had an upper endoscopy in October 2017 and was found to have a benign esophageal stricture which was dilated. All of the above discussed with the patient and his wife. He wants to go home and would prefer to get remainder of the workup as an outpatient. This would be reasonable. Have ordered labs today including SPEP, CRP, beta 2 microglobulin, free light chains, LDH, PSA.  History of essential hypertension. Stable. Continue home medications  Hyperlipidemia. Continue statin  Patient wants to go home and pursue further workup as outpatient. He feels well. I think that this is reasonable considering stable labs and clinical status. His wife agrees. [place] for discharge tonight. Patient to call bis PCP in AM.  PERTINENT LABS:  The results of significant diagnostics from this hospitalization (including imaging, microbiology, ancillary and laboratory) are listed below for reference.      Labs: Basic Metabolic Panel:  Recent Labs Lab 02/17/17 1841 02/18/17 1438  NA 139 141  K 3.6 4.2  CL 109 106  CO2 27 28  GLUCOSE 133* 98  BUN 18 19  CREATININE 0.79 0.85  CALCIUM 8.9 9.4   Liver Function Tests:  Recent Labs Lab 02/18/17 1438  AST 20  ALT 16*  ALKPHOS 62  BILITOT 0.4  PROT 7.4  ALBUMIN 3.8   CBC:  Recent Labs Lab 02/17/17 1841 02/18/17 1438  WBC 6.9 10.3  NEUTROABS  --  6.9  HGB 12.6* 13.4  HCT 38.4* 41.2  MCV 85.1 86.7  PLT 217 221   Cardiac Enzymes:  Recent Labs Lab 02/18/17 0249 02/18/17 0845  TROPONINI 0.03*  0.03* 0.03*    IMAGING STUDIES Dg Chest 2 View  Result Date: 02/17/2017 CLINICAL DATA:  Left-sided chest pain. EXAM: CHEST  2 VIEW COMPARISON:  Chest x-ray dated 05/18/2016. FINDINGS: Heart size and mediastinal contours are normal.  Lungs are clear. No pleural effusion or pneumothorax seen. Osseous structures about the chest are unremarkable. IMPRESSION: No active cardiopulmonary disease. No evidence of pneumonia or pulmonary edema. Electronically Signed   By: Franki Cabot M.D.   On: 02/17/2017 18:49   Mr Thoracic Spine Wo Contrast  Result Date: 02/18/2017 CLINICAL DATA:  Chest and left-sided mid back pain. EXAM: MRI THORACIC SPINE WITHOUT CONTRAST TECHNIQUE: Multiplanar, multisequence MR imaging of the thoracic spine was performed. No intravenous contrast was administered. COMPARISON:  Chest CT 07/20/2016 FINDINGS: Alignment:  Normal Vertebrae: There are lesions noted in the T5, T6 and T8 vertebral bodies. These have low T1 and high STIR signal intensity and are worrisome for metastatic disease or myeloma. Recommend metastatic workup. No pathologic fracture. No other vertebral body involvement is identified. No sternal involvement. Cord:  Normal thoracic spinal cord.  No cord lesions or syrinx. Paraspinal and other soft tissues: No significant findings. No esophageal mass. The thyroid gland is grossly normal. The right adrenal gland appears normal. Disc levels: Very shallow left paracentral disc protrusions at T6-7 and T7-8. No significant neural compression. No spinal or foraminal stenosis. No foraminal lesions. IMPRESSION: Suspect lesions in the T5, T6 and T8 vertebral bodies suspicious for metastasis or myeloma. Recommend metastatic workup. No pathologic fracture. Normal thoracic spinal cord. Very shallow left paracentral disc protrusions at T6-7 and T7-8. These results will be called to the ordering clinician or representative by the Radiologist Assistant, and communication documented in the PACS or zVision Dashboard. Electronically Signed   By: Marijo Sanes M.D.   On: 02/18/2017 13:34   Mr Thoracic Spine W Contrast  Result Date: 02/18/2017 CLINICAL DATA:  Follow-up exam for abnormal lesions identified on prior MRI. EXAM: MRI  THORACIC SPINE WITH CONTRAST TECHNIQUE: Multiplanar, multisequence MR imaging of the thoracic spine was performed following the administration of intravenous contrast. COMPARISON:  Prior noncontrast MRI performed earlier the same day. FINDINGS: Alignment:  Stable alignment.  No listhesis. Vertebrae: Previously identified lesions positioned at the T5, T6, and T8 vertebral bodies demonstrate fairly diffuse homogeneous postcontrast enhancement (series 7, image 7). No extra osseous or soft tissue component identified. No other abnormal enhancement within the thoracic spine. Minimal discogenic enhancement noted at the anterior aspect of these superior endplate of T7. No other focal osseous lesions. Cord: Better evaluated on MRI earlier the same day. No abnormal enhancement. Paraspinal and other soft tissues: Paraspinous soft tissues within normal limits. Possible few scattered cysts noted within the partially visualized right kidney. Visualized vessel structures otherwise unremarkable. Disc levels: Mild degenerative changes again noted, fully described on MRI earlier the same day. IMPRESSION: 1. Previously identified lesions involving the T5, T6, and T8 vertebral bodies demonstrate diffuse postcontrast enhancement. Again, findings are suspicious for possible metastases or myeloma. 2. No other focal lesions or abnormal enhancement identified within the thoracic spine. Electronically Signed   By: Jeannine Boga M.D.   On: 02/18/2017 19:16    DISCHARGE EXAMINATION: See progress note from earlier today.  DISPOSITION: Home  Discharge Instructions    Call MD for:  difficulty breathing, headache or visual disturbances    Complete by:  As directed    Call MD for:  extreme fatigue    Complete by:  As directed    Call MD for:  persistant dizziness or light-headedness    Complete by:  As directed    Call MD for:  persistant nausea and vomiting    Complete by:  As directed    Call MD for:  severe uncontrolled  pain    Complete by:  As directed    Call MD for:  temperature >100.4    Complete by:  As directed    Discharge instructions    Complete by:  As directed    Please be sure to call your PCP tomorrow to start the process to further evaluate the lesions found on the MRI. Please seek attention if chest pain recurs. Seek attention immedialteyl if you note any changes in your bowel or urine habits or if you note any weakness in your legs.  You were cared for by a hospitalist during your hospital stay. If you have any questions about your discharge medications or the care you received while you were in the hospital after you are discharged, you can call the unit and asked to speak with the hospitalist on call if the hospitalist that took care of you is not available. Once you are discharged, your primary care physician will handle any further medical issues. Please note that NO REFILLS for any discharge medications will be authorized once you are discharged, as it is imperative that you return to your primary care physician (or establish a relationship with a primary care physician if you do not have one) for your aftercare needs so that they can reassess your need for medications and monitor your lab values. If you do not have a primary care physician, you can call [phone] for a physician referral.   Increase activity slowly    Complete by:  As directed       ALLERGIES: No Known Allergies  Current Discharge Medication List    CONTINUE these medications which have NOT CHANGED   Details  aspirin EC 81 MG tablet Take 1 tablet (81 mg total) by mouth daily. Qty: 90 tablet, Refills: 3   Associated Diagnoses: Type 2 diabetes mellitus with complication, without long-term current use of insulin ([place]); Amaurosis fugax    losartan (COZAAR) 100 MG tablet Take 1 tablet (100 mg total) by mouth daily. Qty: 90 tablet, Refills: 3   Associated Diagnoses: Type 2 diabetes mellitus with complication, without  long-term current use of insulin ([place]); Essential hypertension    metoprolol succinate (TOPROL-XL) 25 MG 24 hr tablet TAKE 1 TABLET (25 MG TOTAL) BY MOUTH DAILY. Qty: 90 tablet, Refills: 1   Associated Diagnoses: Essential hypertension    Multiple Vitamins-Minerals (MULTIVITAMIN ADULT) TABS Take 1 tablet by mouth daily.    omeprazole (PRILOSEC OTC) 20 MG tablet Take 20 mg by mouth daily.    rosuvastatin (CRESTOR) 40 MG tablet Take 1 tablet (40 mg total) by mouth daily. Qty: 90 tablet, Refills: 3   Associated Diagnoses: Type 2 diabetes mellitus with complication, without long-term current use of insulin ([place]); Amaurosis fugax; Hyperlipidemia with target LDL less than 100    ergocalciferol (DRISDOL) 50000 UNITS capsule Take 1 capsule (50,000 Units total) by mouth once a week. Qty: 4 capsule, Refills: 11   Associated Diagnoses: Vitamin D deficiency         Follow-up Information    Marcello Moores  Ronnald Ramp, MD. Call.   Specialty:  Internal Medicine Why:  to discuss further evaluation of the lesions noted on your MRI. Contact information: [address] [address] [phone]           TOTAL DISCHARGE TIME: 85 mins  [facility] Pager [phone]  02/18/2017, 7:38 PM

## 2017-02-18 NOTE — Progress Notes (Signed)
RN paged Dr Maryland Pink to notify him that RN had been notified by radiology for abnormal MRI results that needed Dr Lyman Speller attention. Dr Maryland Pink came to patient's room to speak to patient. Wife was not present, and Dr Maryland Pink left his card for patient and instructed patient to notify RN when patient's wife arrived back on the unit. Patient's wife notified RN upon arrival to unit and RN paged Dr Maryland Pink with patient's wife's cell phone number, in case MD unable to come back to floor. Dr Maryland Pink notified RN that he had ordered another MRI, this time with contrast, and that he had ordered it stat and asked RN to call radiology to see when patient might be able to go down for test. RN called MRI department and they stated that they currently had "another patient on the table but we can probably do him next."

## 2017-02-18 NOTE — Progress Notes (Signed)
[facility] PROGRESS NOTE  Donald Jennings DIY:[id number] DOB: 1956-05-20 DOA: 02/17/2017  PCP: Scarlette Calico, MD  Brief History/Interval Summary: 61 year old male with medical history significant of Amyrosis fugax, HTN, HLD, smoking. Doesn't in with complaints of chest pain as well as back pain. Pain would come about when he uses body position. Asian was hospitalized for further management.    Reason for Visit: Chest pain and back pain  Consultants: None  Procedures: None  Antibiotics: None  Subjective/Interval History: Patient states that this pain occurs only when he bends or moves his upper torso. Mainly has pain in the back, which he states that it seems to radiate to his chest. Denies any shortness of breath. Denies any recent falls or injuries. Denies any problems with urinating or bowel movement. Denies any weakness in his legs.  ROS: Denies any nausea or vomiting  Objective:  Vital Signs  Vitals:   02/17/17 2035 02/17/17 2200 02/17/17 2250 02/18/17 0410  BP: 122/67 119/68 123/63 (!) 111/56  Pulse: 70 64 76 85  Resp: 17 16 18 18   Temp:   97.6 F (36.4 C) 98.4 F (36.9 C)  TempSrc:   Oral Oral  SpO2: 100% 94% 100% 96%  Weight:   71.8 kg (158 lb 6.4 oz)   Height:   5\' 8"  (1.727 m)     Intake/Output Summary (Last 24 hours) at 02/18/17 1700 Last data filed at 02/18/17 0600  Gross per 24 hour  Intake                0 ml  Output                0 ml  Net                0 ml   Filed Weights   02/17/17 1813 02/17/17 2250  Weight: 73 kg (161 lb) 71.8 kg (158 lb 6.4 oz)    General appearance: alert, cooperative, appears stated age and no distress Resp: clear to auscultation bilaterally Cardio: regular rate and rhythm, S1, S2 normal, no murmur, click, rub or gallop Back: Patient does have pain with palpation over the thoracic spine as well as lateral to the thoracic spine. GI: soft, non-tender; bowel sounds normal; no masses,  no organomegaly Extremities:  extremities normal, atraumatic, no cyanosis or edema Neurologic: No focal neurological deficits. Able to lift both legs of the bed.  Lab Results:  Data Reviewed: I have personally reviewed following labs and imaging studies  CBC:  Recent Labs Lab 02/17/17 1841 02/18/17 1438  WBC 6.9 10.3  NEUTROABS  --  6.9  HGB 12.6* 13.4  HCT 38.4* 41.2  MCV 85.1 86.7  PLT 217 076    Basic Metabolic Panel:  Recent Labs Lab 02/17/17 1841 02/18/17 1438  NA 139 141  K 3.6 4.2  CL 109 106  CO2 27 28  GLUCOSE 133* 98  BUN 18 19  CREATININE 0.79 0.85  CALCIUM 8.9 9.4    GFR: Estimated Creatinine Clearance: 90.5 mL/min (by C-G formula based on SCr of 0.85 mg/dL).  Liver Function Tests:  Recent Labs Lab 02/18/17 1438  AST 20  ALT 16*  ALKPHOS 62  BILITOT 0.4  PROT 7.4  ALBUMIN 3.8    Cardiac Enzymes:  Recent Labs Lab 02/18/17 0249 02/18/17 0845  TROPONINI 0.03*  0.03* 0.03*     Radiology Studies: Dg Chest 2 View  Result Date: 02/17/2017 CLINICAL DATA:  Left-sided chest pain. EXAM: CHEST  2  VIEW COMPARISON:  Chest x-ray dated 05/18/2016. FINDINGS: Heart size and mediastinal contours are normal. Lungs are clear. No pleural effusion or pneumothorax seen. Osseous structures about the chest are unremarkable. IMPRESSION: No active cardiopulmonary disease. No evidence of pneumonia or pulmonary edema. Electronically Signed   By: Franki Cabot M.D.   On: 02/17/2017 18:49   Mr Thoracic Spine Wo Contrast  Result Date: 02/18/2017 CLINICAL DATA:  Chest and left-sided mid back pain. EXAM: MRI THORACIC SPINE WITHOUT CONTRAST TECHNIQUE: Multiplanar, multisequence MR imaging of the thoracic spine was performed. No intravenous contrast was administered. COMPARISON:  Chest CT 07/20/2016 FINDINGS: Alignment:  Normal Vertebrae: There are lesions noted in the T5, T6 and T8 vertebral bodies. These have low T1 and high STIR signal intensity and are worrisome for metastatic disease or myeloma.  Recommend metastatic workup. No pathologic fracture. No other vertebral body involvement is identified. No sternal involvement. Cord:  Normal thoracic spinal cord.  No cord lesions or syrinx. Paraspinal and other soft tissues: No significant findings. No esophageal mass. The thyroid gland is grossly normal. The right adrenal gland appears normal. Disc levels: Very shallow left paracentral disc protrusions at T6-7 and T7-8. No significant neural compression. No spinal or foraminal stenosis. No foraminal lesions. IMPRESSION: Suspect lesions in the T5, T6 and T8 vertebral bodies suspicious for metastasis or myeloma. Recommend metastatic workup. No pathologic fracture. Normal thoracic spinal cord. Very shallow left paracentral disc protrusions at T6-7 and T7-8. These results will be called to the ordering clinician or representative by the Radiologist Assistant, and communication documented in the PACS or zVision Dashboard. Electronically Signed   By: Marijo Sanes M.D.   On: 02/18/2017 13:34     Medications:  Scheduled: . aspirin EC  81 mg Oral Daily  . enoxaparin (LOVENOX) injection  40 mg Subcutaneous Daily  . losartan  100 mg Oral Daily  . metoprolol succinate  25 mg Oral Daily  . multivitamin with minerals  1 tablet Oral Daily  . pantoprazole  40 mg Oral Daily  . rosuvastatin  40 mg Oral Daily   Continuous:  WER:[id number], ondansetron (ZOFRAN) IV  Assessment/Plan:  Principal Problem:   Chest pain, rule out acute myocardial infarction Active Problems:   Essential hypertension   Hyperlipidemia with target LDL less than 100    Chest pain EKG does not show any ischemic changes. Troponin not elevated. Previous records reviewed. He had an intermediate risk stress test last year. Seen by cardiology and underwent CT coronaries. Calcium score was only in the 40s. Current presentation appears to be more of a musculoskeletal issue. See below. I do not think he needs any further cardiac  workup.  Back pain/T-spine vertebral body lesions Patient does mention that he's had back pain for a few weeks. At times, seems to radiate to the front. Denies any lower extremely loose. He does have point tenderness over thoracic spine. MRI of the thoracic spine was done and showed lesions in T5, T6 and T8, concerning for metastatic disease or myeloma. Discussed with the radiologist. Contrast might better delineate these lesions. So we'll repeat thoracic spine MRI ordered this time with contrast. Patient denies any loss of weight recently. Denies any blood in the stool or blood in the urine. He had a low-dose CT scan of lung did not show any concerning findings. This was last year. His colonoscopy in 2016 was also without any abnormalities. He had an upper endoscopy in October 2017 and was found to have a benign esophageal stricture  which was dilated. All of the above discussed with the patient and his wife. He wants to go home after MRIs done and would prefer to get remainder of the workup as an outpatient. This would be reasonable. But first we will wait for the MRI to be done with contrast. Have ordered labs today including SPEP, CRP, beta 2 microglobulin, free light chains, LDH.  History of essential hypertension. Stable. Continue home medications  Hyperlipidemia. Continue statin  DVT Prophylaxis: Lovenox    Code Status: Full code  Family Communication: Discussed with the patient and his wife  Disposition Plan: Management as outlined above.    LOS: 0 days   [facility] Pager [phone] 02/18/2017, 5:00 PM  If 7PM-7AM, please contact night-coverage at [email or web address], password [facility]

## 2017-02-18 NOTE — Discharge Instructions (Signed)
MRI THORACIC SPINE WITH CONTRAST  TECHNIQUE: Multiplanar, multisequence MR imaging of the thoracic spine was performed following the administration of intravenous contrast.  COMPARISON:  Prior noncontrast MRI performed earlier the same day.  FINDINGS: Alignment:  Stable alignment.  No listhesis.  Vertebrae: Previously identified lesions positioned at the T5, T6, and T8 vertebral bodies demonstrate fairly diffuse homogeneous postcontrast enhancement (series 7, image 7). No extra osseous or soft tissue component identified.  No other abnormal enhancement within the thoracic spine. Minimal discogenic enhancement noted at the anterior aspect of these superior endplate of T7. No other focal osseous lesions.  Cord: Better evaluated on MRI earlier the same day. No abnormal enhancement.  Paraspinal and other soft tissues: Paraspinous soft tissues within normal limits. Possible few scattered cysts noted within the partially visualized right kidney. Visualized vessel structures otherwise unremarkable.  Disc levels:  Mild degenerative changes again noted, fully described on MRI earlier the same day.  IMPRESSION: 1. Previously identified lesions involving the T5, T6, and T8 vertebral bodies demonstrate diffuse postcontrast enhancement. Again, findings are suspicious for possible metastases or myeloma. 2. No other focal lesions or abnormal enhancement identified within the thoracic spine.

## 2017-02-18 NOTE — Progress Notes (Signed)
RN had called down to MRI around 1700 and they stated that they were ready for the patient and that they were sending for him to come down to MRI. Dr Maryland Pink called few minutes after RN hung up with MRI and he instructed RN to alert him when MRI results came back. RN verbalized understanding. Patient's transport to MRI did not arrive to unit until after 1800. RN notified Dr Maryland Pink, as patient is adamant about wanting to be discharged today and pt stated that he could follow up outpatient about further workup, if any. and that it would most likely be after 1900 shift change before results are back from MRI. Dr Maryland Pink stated that he will call night shift nurse about patient's potential discharge later this evening/tonight, and that he could speak to the family over the phone about the MRI results. RN verbalized understanding of this information and will pass along to night shift nurse.

## 2017-02-19 ENCOUNTER — Telehealth: Payer: Self-pay | Admitting: Internal Medicine

## 2017-02-19 LAB — PROTEIN ELECTROPHORESIS, SERUM
A/G Ratio: 1.2 (ref 0.7–1.7)
ALBUMIN ELP: 3.8 g/dL (ref 2.9–4.4)
Alpha-1-Globulin: 0.2 g/dL (ref 0.0–0.4)
Alpha-2-Globulin: 0.8 g/dL (ref 0.4–1.0)
Beta Globulin: 1 g/dL (ref 0.7–1.3)
GAMMA GLOBULIN: 1.1 g/dL (ref 0.4–1.8)
Globulin, Total: 3.1 g/dL (ref 2.2–3.9)
TOTAL PROTEIN ELP: 6.9 g/dL (ref 6.0–8.5)

## 2017-02-19 LAB — BETA 2 MICROGLOBULIN, SERUM: Beta-2 Microglobulin: 1.6 mg/L (ref 0.6–2.4)

## 2017-02-19 LAB — KAPPA/LAMBDA LIGHT CHAINS
Kappa free light chain: 18.1 mg/L (ref 3.3–19.4)
Kappa, lambda light chain ratio: 0.9 (ref 0.26–1.65)
Lambda free light chains: 20.1 mg/L (ref 5.7–26.3)

## 2017-02-19 NOTE — Telephone Encounter (Signed)
Wife called and Request that a referral for oncology due to multiple myeloma is suspected.   Dr. Shela Commons at [facility] is requested.

## 2017-02-22 ENCOUNTER — Other Ambulatory Visit: Payer: Self-pay | Admitting: Internal Medicine

## 2017-02-22 ENCOUNTER — Telehealth: Payer: Self-pay | Admitting: Internal Medicine

## 2017-02-22 DIAGNOSIS — M899 Disorder of bone, unspecified: Secondary | ICD-10-CM

## 2017-02-22 DIAGNOSIS — M898X9 Other specified disorders of bone, unspecified site: Secondary | ICD-10-CM

## 2017-02-22 HISTORY — DX: Disorder of bone, unspecified: M89.9

## 2017-02-22 HISTORY — DX: Other specified disorders of bone, unspecified site: M89.8X9

## 2017-02-22 NOTE — Telephone Encounter (Signed)
Wife called back to follow up in regard and was notified in response time and jones was out of the office Friday, requested to speak to [place] in regard. Did request to be worked today with Dr. Ronnald Ramp. Please follow up in regard.

## 2017-02-22 NOTE — Telephone Encounter (Signed)
Donald Jennings informed and they will make an appt for a day or two after biopsy to go over the results.   Informed Donald Jennings of the referral process and they should expect a call in the very near future.

## 2017-02-22 NOTE — Telephone Encounter (Signed)
Order entered for CT-guided biopsy

## 2017-02-22 NOTE — Telephone Encounter (Signed)
The testing so far has not been consistent with MM The other tests for cancer were also negative I think he should have one of the spinal lesions biopsied thru a procedure with a radiologist

## 2017-02-22 NOTE — Telephone Encounter (Signed)
Spoke to pt wife Elmyra Ricks) and she stated understanding and appreciation. Do you want to order test or have them make an appt first?  She feels better with your explanation and does not feel the need to come in unless you want them to come in.   Please advise

## 2017-02-26 ENCOUNTER — Other Ambulatory Visit: Payer: Self-pay | Admitting: Student

## 2017-02-26 ENCOUNTER — Other Ambulatory Visit: Payer: Self-pay | Admitting: Radiology

## 2017-02-26 ENCOUNTER — Other Ambulatory Visit (HOSPITAL_COMMUNITY): Payer: Self-pay | Admitting: Interventional Radiology

## 2017-02-26 DIAGNOSIS — M899 Disorder of bone, unspecified: Secondary | ICD-10-CM

## 2017-02-26 DIAGNOSIS — M898X9 Other specified disorders of bone, unspecified site: Secondary | ICD-10-CM

## 2017-03-01 ENCOUNTER — Other Ambulatory Visit: Payer: Self-pay | Admitting: General Surgery

## 2017-03-01 ENCOUNTER — Ambulatory Visit (HOSPITAL_COMMUNITY)
Admission: RE | Admit: 2017-03-01 | Discharge: 2017-03-01 | Disposition: A | Discharge status: Home / Self Care | Payer: Federal, State, Local not specified - PPO | Source: Ambulatory Visit | Attending: Interventional Radiology | Admitting: Interventional Radiology

## 2017-03-01 DIAGNOSIS — M899 Disorder of bone, unspecified: Secondary | ICD-10-CM

## 2017-03-01 DIAGNOSIS — M546 Pain in thoracic spine: Secondary | ICD-10-CM | POA: Diagnosis not present

## 2017-03-01 HISTORY — PX: IR RADIOLOGIST EVAL & MGMT: IMG5224

## 2017-03-02 ENCOUNTER — Encounter (HOSPITAL_COMMUNITY): Payer: Self-pay

## 2017-03-02 ENCOUNTER — Ambulatory Visit (HOSPITAL_COMMUNITY)
Admission: RE | Admit: 2017-03-02 | Discharge: 2017-03-02 | Disposition: A | Discharge status: Home / Self Care | Payer: Federal, State, Local not specified - PPO | Source: Ambulatory Visit | Attending: Internal Medicine | Admitting: Internal Medicine

## 2017-03-02 ENCOUNTER — Other Ambulatory Visit: Payer: Self-pay | Admitting: Internal Medicine

## 2017-03-02 DIAGNOSIS — F329 Major depressive disorder, single episode, unspecified: Secondary | ICD-10-CM | POA: Insufficient documentation

## 2017-03-02 DIAGNOSIS — M5412 Radiculopathy, cervical region: Secondary | ICD-10-CM | POA: Diagnosis not present

## 2017-03-02 DIAGNOSIS — F17210 Nicotine dependence, cigarettes, uncomplicated: Secondary | ICD-10-CM | POA: Diagnosis not present

## 2017-03-02 DIAGNOSIS — Z833 Family history of diabetes mellitus: Secondary | ICD-10-CM | POA: Insufficient documentation

## 2017-03-02 DIAGNOSIS — M898X8 Other specified disorders of bone, other site: Secondary | ICD-10-CM | POA: Diagnosis not present

## 2017-03-02 DIAGNOSIS — Z7982 Long term (current) use of aspirin: Secondary | ICD-10-CM | POA: Insufficient documentation

## 2017-03-02 DIAGNOSIS — E785 Hyperlipidemia, unspecified: Secondary | ICD-10-CM | POA: Insufficient documentation

## 2017-03-02 DIAGNOSIS — D72822 Plasmacytosis: Secondary | ICD-10-CM | POA: Diagnosis not present

## 2017-03-02 DIAGNOSIS — K219 Gastro-esophageal reflux disease without esophagitis: Secondary | ICD-10-CM | POA: Insufficient documentation

## 2017-03-02 DIAGNOSIS — M899 Disorder of bone, unspecified: Secondary | ICD-10-CM

## 2017-03-02 DIAGNOSIS — I10 Essential (primary) hypertension: Secondary | ICD-10-CM | POA: Insufficient documentation

## 2017-03-02 DIAGNOSIS — M549 Dorsalgia, unspecified: Secondary | ICD-10-CM | POA: Diagnosis not present

## 2017-03-02 DIAGNOSIS — Z8249 Family history of ischemic heart disease and other diseases of the circulatory system: Secondary | ICD-10-CM | POA: Insufficient documentation

## 2017-03-02 DIAGNOSIS — Z823 Family history of stroke: Secondary | ICD-10-CM | POA: Diagnosis not present

## 2017-03-02 DIAGNOSIS — I2510 Atherosclerotic heart disease of native coronary artery without angina pectoris: Secondary | ICD-10-CM | POA: Insufficient documentation

## 2017-03-02 DIAGNOSIS — M4854XA Collapsed vertebra, not elsewhere classified, thoracic region, initial encounter for fracture: Secondary | ICD-10-CM | POA: Insufficient documentation

## 2017-03-02 HISTORY — PX: IR FLUORO GUIDED NEEDLE PLC ASPIRATION/INJECTION LOC: IMG2395

## 2017-03-02 LAB — CBC
HEMATOCRIT: 39 % (ref 39.0–52.0)
Hemoglobin: 12.6 g/dL — ABNORMAL LOW (ref 13.0–17.0)
MCH: 27.7 pg (ref 26.0–34.0)
MCHC: 32.3 g/dL (ref 30.0–36.0)
MCV: 85.7 fL (ref 78.0–100.0)
Platelets: 173 10*3/uL (ref 150–400)
RBC: 4.55 MIL/uL (ref 4.22–5.81)
RDW: 14.3 % (ref 11.5–15.5)
WBC: 7.5 10*3/uL (ref 4.0–10.5)

## 2017-03-02 LAB — APTT: aPTT: 35 seconds (ref 24–36)

## 2017-03-02 LAB — PROTIME-INR
INR: 1.05
Prothrombin Time: 13.7 seconds (ref 11.4–15.2)

## 2017-03-02 MED ORDER — FENTANYL CITRATE (PF) 100 MCG/2ML IJ SOLN
INTRAMUSCULAR | Status: AC
  Filled 2017-03-02: qty 4

## 2017-03-02 MED ORDER — SODIUM CHLORIDE 0.9 % IV SOLN: INTRAVENOUS | Status: AC

## 2017-03-02 MED ORDER — BUPIVACAINE HCL (PF) 0.25 % IJ SOLN
INTRAMUSCULAR | Status: AC
  Filled 2017-03-02: qty 30

## 2017-03-02 MED ORDER — FENTANYL CITRATE (PF) 100 MCG/2ML IJ SOLN
INTRAMUSCULAR | Status: AC | PRN
  Administered 2017-03-02 (×3): 25 ug via INTRAVENOUS

## 2017-03-02 MED ORDER — HYDROMORPHONE HCL 1 MG/ML IJ SOLN
INTRAMUSCULAR | Status: AC
  Filled 2017-03-02: qty 1

## 2017-03-02 MED ORDER — MIDAZOLAM HCL 2 MG/2ML IJ SOLN
INTRAMUSCULAR | Status: AC | PRN
  Administered 2017-03-02 (×3): 1 mg via INTRAVENOUS

## 2017-03-02 MED ORDER — SODIUM CHLORIDE 0.9 % IV SOLN: INTRAVENOUS | Status: DC

## 2017-03-02 MED ORDER — SODIUM CHLORIDE 0.9 % IV SOLN
INTRAVENOUS | Status: AC | PRN
  Administered 2017-03-02: 10 mL/h via INTRAVENOUS

## 2017-03-02 MED ORDER — MIDAZOLAM HCL 2 MG/2ML IJ SOLN
INTRAMUSCULAR | Status: AC
  Filled 2017-03-02: qty 4

## 2017-03-02 MED ORDER — BUPIVACAINE HCL (PF) 0.5 % IJ SOLN
INTRAMUSCULAR | Status: AC | PRN
  Administered 2017-03-02: 30 mL
  Administered 2017-03-02: 50 mL

## 2017-03-02 MED ORDER — HYDROMORPHONE HCL 1 MG/ML IJ SOLN
INTRAMUSCULAR | Status: AC | PRN
  Administered 2017-03-02: 1 mg via INTRAVENOUS

## 2017-03-02 NOTE — Discharge Instructions (Addendum)
Needle Biopsy, Care After °Refer to this sheet in the next few weeks. These instructions provide you with information about caring for yourself after your procedure. Your health care provider may also give you more specific instructions. Your treatment has been planned according to current medical practices, but problems sometimes occur. Call your health care provider if you have any problems or questions after your procedure. °What can I expect after the procedure? °After your procedure, it is common to have soreness, bruising, or mild pain at the biopsy site. This should go away in a few days. °Follow these instructions at home: °· Rest as directed by your health care provider. °· Take medicines only as directed by your health care provider. °· There are many different ways to close and cover the biopsy site, including stitches (sutures), skin glue, and adhesive strips. Follow your health care provider's instructions about: °¨ Biopsy site care. °¨ Bandage (dressing) changes and removal. °¨ Biopsy site closure removal. °· Check your biopsy site every day for signs of infection. Watch for: °¨ Redness, swelling, or pain. °¨ Fluid, blood, or pus. °Contact a health care provider if: °· You have a fever. °· You have redness, swelling, or pain at the biopsy site that lasts longer than a few days. °· You have fluid, blood, or pus coming from the biopsy site. °· You feel nauseous. °· You vomit. °Get help right away if: °· You have shortness of breath. °· You have trouble breathing. °· You have chest pain. °· You feel dizzy or you faint. °· You have bleeding that does not stop with pressure or a bandage. °· You cough up blood. °· You have pain in your abdomen. °This information is not intended to replace advice given to you by your health care provider. Make sure you discuss any questions you have with your health care provider. °Document Released: 04/02/2015 Document Revised: 04/23/2016 Document Reviewed:  11/12/2014 °Elsevier Interactive Patient Education © 2017 [employer] °Moderate Conscious Sedation, Adult, Care After °These instructions provide you with information about caring for yourself after your procedure. Your health care provider may also give you more specific instructions. Your treatment has been planned according to current medical practices, but problems sometimes occur. Call your health care provider if you have any problems or questions after your procedure. °What can I expect after the procedure? °After your procedure, it is common: °· To feel sleepy for several hours. °· To feel clumsy and have poor balance for several hours. °· To have poor judgment for several hours. °· To vomit if you eat too soon. °Follow these instructions at home: °For at least 24 hours after the procedure:  ° °· Do not: °¨ Participate in activities where you could fall or become injured. °¨ Drive. °¨ Use heavy machinery. °¨ Drink alcohol. °¨ Take sleeping pills or medicines that cause drowsiness. °¨ Make important decisions or sign legal documents. °¨ Take care of children on your own. °· Rest. °Eating and drinking  °· Follow the diet recommended by your health care provider. °· If you vomit: °¨ Drink water, juice, or soup when you can drink without vomiting. °¨ Make sure you have little or no nausea before eating solid foods. °General instructions  °· Have a responsible adult stay with you until you are awake and alert. °· Take over-the-counter and prescription medicines only as told by your health care provider. °· If you smoke, do not smoke without supervision. °· Keep all follow-up visits as told by your   health care provider. This is important. °Contact a health care provider if: °· You keep feeling nauseous or you keep vomiting. °· You feel light-headed. °· You develop a rash. °· You have a fever. °Get help right away if: °· You have trouble breathing. °This information is not intended to replace advice given to you  by your health care provider. Make sure you discuss any questions you have with your health care provider. °Document Released: 09/06/2013 Document Revised: 04/20/2016 Document Reviewed: 03/07/2016 °Elsevier Interactive Patient Education © 2017 [employer] Inc. ° °

## 2017-03-02 NOTE — H&P (Signed)
Chief Complaint: Patient was seen in consultation today for Thoracic 5;6 and 8 biopsy at the request of Jones,Thomas L  Referring Physician(s): Scarlette Calico L  Supervising Physician: Luanne Bras  Patient Status: [facility] - Out-pt  History of Present Illness: Donald Jennings is a 61 y.o. male   Back pain x 3 weeks Developed chest pain Was hospitalized and worked up 02/18/17 All cardiac negative MRI: 3/22: IMPRESSION: 1. Previously identified lesions involving the T5, T6, and T8 vertebral bodies demonstrate diffuse postcontrast enhancement. Again, findings are suspicious for possible metastases or myeloma. 2. No other focal lesions or abnormal enhancement identified within the thoracic spine.  Now scheduled for biopsy of each level.   Past Medical History:  Diagnosis Date  . CAD (coronary artery disease)   . CERVICAL RADICULOPATHY 07/02/2010   Qualifier: Diagnosis of  By: Ronnald Ramp MD, Arvid Right.   . DEPRESSION 03/21/2009   Qualifier: Diagnosis of  By: Ronnald Ramp MD, Arvid Right.   . Diabetes mellitus without complication ([place])   . GERD 06/19/2009   Qualifier: Diagnosis of  By: Ronnald Ramp MD, Arvid Right.   [name] HLD (hyperlipidemia)   . Hx of cardiovascular stress test    ETT-Myoview (03/2014):  No ischemia, EF 56%; Low Risk  . HYPERTENSION 06/19/2009   Qualifier: Diagnosis of  By: Ronnald Ramp MD, [place], RIGHT 07/02/2010   Qualifier: Diagnosis of  By: Ronnald Ramp MD, Arvid Right.   . SOB (shortness of breath) 06/10/2016  . TOBACCO USE 03/21/2009   Qualifier: Diagnosis of  By: Ronnald Ramp MD, Arvid Right.     Past Surgical History:  Procedure Laterality Date  . CATARACT EXTRACTION Bilateral    Left 2009 at [facility], Right 2004 in [place]  . COLONOSCOPY  07/25/2015   Dr Carol Ada  . INGUINAL HERNIA REPAIR Right 1986    Allergies: Patient has no known allergies.  Medications: Prior to Admission medications   Medication Sig Start Date End Date Taking? Authorizing Provider  aspirin EC  81 MG tablet Take 1 tablet (81 mg total) by mouth daily. 06/03/16  Yes Janith Lima, MD  losartan (COZAAR) 100 MG tablet Take 1 tablet (100 mg total) by mouth daily. 05/18/16  Yes Janith Lima, MD  metoprolol succinate (TOPROL-XL) 25 MG 24 hr tablet TAKE 1 TABLET (25 MG TOTAL) BY MOUTH DAILY. 11/08/16  Yes Janith Lima, MD  Multiple Vitamins-Minerals (MULTIVITAMIN ADULT) TABS Take 1 tablet by mouth daily.   Yes Historical Provider, MD  omeprazole (PRILOSEC OTC) 20 MG tablet Take 20 mg by mouth daily.   Yes Historical Provider, MD  rosuvastatin (CRESTOR) 40 MG tablet Take 1 tablet (40 mg total) by mouth daily. 06/03/16  Yes Janith Lima, MD  ergocalciferol (DRISDOL) 50000 UNITS capsule Take 1 capsule (50,000 Units total) by mouth once a week. 05/27/15   Janith Lima, MD     Family History  Problem Relation Age of Onset  . Diabetes Mother   . Arthritis Mother   . Heart disease Father   . Stroke Father   . Stroke Brother   . Cancer Neg Hx   . Early death Neg Hx   . Hypertension Neg Hx   . Hyperlipidemia Neg Hx   . Kidney disease Neg Hx     Social History   Social History  . Marital status: Married    Spouse name: N/A  . Number of children: 1  . Years of education: N/A  Occupational History  . Scientist, clinical (histocompatibility and immunogenetics)    Social History Main Topics  . Smoking status: Current Every Day Smoker    Packs/day: 1.00    Years: 38.50    Types: Cigarettes  . Smokeless tobacco: Never Used     Comment: Currently  smokes 1/2 ppd and is considering Chantix  . Alcohol use No  . Drug use: No  . Sexual activity: Not Asked   Other Topics Concern  . 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  All other systems are negative.  Review of Systems  Constitutional: Positive for activity change. Negative  for fatigue and fever.  Respiratory: Negative for cough.   Cardiovascular: Negative for chest pain.  Gastrointestinal: Negative for abdominal pain.  Musculoskeletal: Positive for back pain. Negative for gait problem.  Psychiatric/Behavioral: Negative for behavioral problems and confusion.    Vital Signs: BP (!) 144/61   Pulse 75   Temp 98.5 F (36.9 C) (Oral)   Ht '5\' 8"'  (1.727 m)   Wt 158 lb 6.4 oz (71.8 kg)   SpO2 98%   BMI 24.08 kg/m   Physical Exam  Constitutional: He is oriented to person, place, and time.  Cardiovascular: Normal rate, regular rhythm and normal heart sounds.   Pulmonary/Chest: Effort normal and breath sounds normal.  Abdominal: Soft. Bowel sounds are normal. There is no tenderness.  Musculoskeletal: Normal range of motion.  Pain to palpate upper back  Neurological: He is alert and oriented to person, place, and time.  Skin: Skin is warm and dry.  Psychiatric: He has a normal mood and affect. His behavior is normal. Judgment and thought content normal.  Nursing note and vitals reviewed.   Mallampati Score:  MD Evaluation Airway: WNL Heart: WNL Abdomen: WNL Chest/ Lungs: WNL ASA  Classification: 3 Mallampati/Airway Score: Two  Imaging: Dg Chest 2 View  Result Date: 02/17/2017 CLINICAL DATA:  Left-sided chest pain. EXAM: CHEST  2 VIEW COMPARISON:  Chest x-ray dated 05/18/2016. FINDINGS: Heart size and mediastinal contours are normal. Lungs are clear. No pleural effusion or pneumothorax seen. Osseous structures about the chest are unremarkable. IMPRESSION: No active cardiopulmonary disease. No evidence of pneumonia or pulmonary edema. Electronically Signed   By: Franki Cabot M.D.   On: 02/17/2017 18:49   Mr Thoracic Spine Wo Contrast  Result Date: 02/18/2017 CLINICAL DATA:  Chest and left-sided mid back pain. EXAM: MRI THORACIC SPINE WITHOUT CONTRAST TECHNIQUE: Multiplanar, multisequence MR imaging of the thoracic spine was performed. No intravenous  contrast was administered. COMPARISON:  Chest CT 07/20/2016 FINDINGS: Alignment:  Normal Vertebrae: There are lesions noted in the T5, T6 and T8 vertebral bodies. These have low T1 and high STIR signal intensity and are worrisome for metastatic disease or myeloma. Recommend metastatic workup. No pathologic fracture. No other vertebral body involvement is identified. No sternal involvement. Cord:  Normal thoracic spinal cord.  No cord lesions or syrinx. Paraspinal and other soft tissues: No significant findings. No esophageal mass. The thyroid gland is grossly normal. The right adrenal gland appears normal. Disc levels: Very shallow left paracentral disc protrusions at T6-7 and T7-8. No significant neural compression. No spinal or foraminal stenosis. No foraminal lesions. IMPRESSION: Suspect lesions in the T5, T6 and T8 vertebral bodies suspicious for metastasis  or myeloma. Recommend metastatic workup. No pathologic fracture. Normal thoracic spinal cord. Very shallow left paracentral disc protrusions at T6-7 and T7-8. These results will be called to the ordering clinician or representative by the Radiologist Assistant, and communication documented in the PACS or zVision Dashboard. Electronically Signed   By: Marijo Sanes M.D.   On: 02/18/2017 13:34   Mr Thoracic Spine W Contrast  Result Date: 02/18/2017 CLINICAL DATA:  Follow-up exam for abnormal lesions identified on prior MRI. EXAM: MRI THORACIC SPINE WITH CONTRAST TECHNIQUE: Multiplanar, multisequence MR imaging of the thoracic spine was performed following the administration of intravenous contrast. COMPARISON:  Prior noncontrast MRI performed earlier the same day. FINDINGS: Alignment:  Stable alignment.  No listhesis. Vertebrae: Previously identified lesions positioned at the T5, T6, and T8 vertebral bodies demonstrate fairly diffuse homogeneous postcontrast enhancement (series 7, image 7). No extra osseous or soft tissue component identified. No other  abnormal enhancement within the thoracic spine. Minimal discogenic enhancement noted at the anterior aspect of these superior endplate of T7. No other focal osseous lesions. Cord: Better evaluated on MRI earlier the same day. No abnormal enhancement. Paraspinal and other soft tissues: Paraspinous soft tissues within normal limits. Possible few scattered cysts noted within the partially visualized right kidney. Visualized vessel structures otherwise unremarkable. Disc levels: Mild degenerative changes again noted, fully described on MRI earlier the same day. IMPRESSION: 1. Previously identified lesions involving the T5, T6, and T8 vertebral bodies demonstrate diffuse postcontrast enhancement. Again, findings are suspicious for possible metastases or myeloma. 2. No other focal lesions or abnormal enhancement identified within the thoracic spine. Electronically Signed   By: Jeannine Boga M.D.   On: 02/18/2017 19:16    Labs:  CBC:  Recent Labs  05/18/16 1646 02/17/17 1841 02/18/17 1438 03/02/17 0838  WBC 8.9 6.9 10.3 7.5  HGB 14.4 12.6* 13.4 12.6*  HCT 44.2 38.4* 41.2 39.0  PLT 297.0 217 221 173    COAGS: No results for input(s): INR, APTT in the last 8760 hours.  BMP:  Recent Labs  05/18/16 1646 02/17/17 1841 02/18/17 1438  NA 136 139 141  K 4.8 3.6 4.2  CL 102 109 106  CO2 '24 27 28  ' GLUCOSE 141* 133* 98  BUN '20 18 19  ' CALCIUM 9.8 8.9 9.4  CREATININE 0.92 0.79 0.85  GFRNONAA  --  >60 >60  GFRAA  --  >60 >60    LIVER FUNCTION TESTS:  Recent Labs  05/18/16 1646 08/14/16 1155 02/18/17 1438  BILITOT 0.3  --  0.4  AST 18  --  20  ALT 17 14 16*  ALKPHOS 68  --  62  PROT 8.0  --  7.4  ALBUMIN 4.8  --  3.8    TUMOR MARKERS: No results for input(s): AFPTM, CEA, CA199, CHROMGRNA in the last 8760 hours.  Assessment and Plan:  Back pain Abnormal MRI Worrisome for Multiple Myeloma vs metastasis Now scheduled for biopsy of T 5/6/8 Risks and Benefits discussed  with the patient including, but not limited to bleeding, infection, damage to adjacent structures or low yield requiring additional tests. All of the patient's questions were answered, patient is agreeable to proceed. Consent signed and in chart.   Thank you for this interesting consult.  I greatly enjoyed [place] and look forward to participating in their care.  A copy of this report was sent to the requesting provider on this date.  Electronically Signed: Monia Sabal A 03/02/2017, 9:10 AM  I spent a total of  30 Minutes   in face to face in clinical consultation, greater than 50% of which was counseling/coordinating care for T5/6/8 bx

## 2017-03-02 NOTE — Procedures (Signed)
S/P T 5,T6 and T8 vertebral body fluoro guided  core biopsies .

## 2017-03-03 ENCOUNTER — Encounter (HOSPITAL_COMMUNITY): Payer: Self-pay | Admitting: Interventional Radiology

## 2017-03-05 ENCOUNTER — Encounter: Payer: Self-pay | Admitting: Internal Medicine

## 2017-03-05 ENCOUNTER — Other Ambulatory Visit: Payer: Self-pay | Admitting: Internal Medicine

## 2017-03-05 DIAGNOSIS — C9030 Solitary plasmacytoma not having achieved remission: Secondary | ICD-10-CM

## 2017-03-05 HISTORY — DX: Solitary plasmacytoma not having achieved remission: C90.30

## 2017-03-08 ENCOUNTER — Telehealth (HOSPITAL_COMMUNITY): Payer: Self-pay | Admitting: Radiology

## 2017-03-08 NOTE — Telephone Encounter (Signed)
Spoke to patient's wife concerning pt's biopsy report. She would like his demographics, biopsy results, and office notes faxed to [facility], Dr. Jeanann Lewandowsky, MD @ [phone]. The coordinator is [place] @ [phone]. I will fax these records over. [facility] has already received the referral just needing additional patient info. JM

## 2017-03-08 NOTE — Telephone Encounter (Signed)
Wife called and wanted to know the results of the biopsy.   Informed of Dr. Ronnald Ramp note and that a referral has been placed.  Pt spouse stated understanding that a referral to oncology was placed and they would contact pt directly to schedule.

## 2017-03-08 NOTE — Telephone Encounter (Signed)
Wife called in.  Wants patient to be referred to [facility] oncology.  Dr. Alvie Heidelberg.  Fax number [phone].

## 2017-03-09 ENCOUNTER — Telehealth: Payer: Self-pay | Admitting: Hematology

## 2017-03-09 ENCOUNTER — Encounter: Payer: Self-pay | Admitting: Hematology

## 2017-03-09 NOTE — Telephone Encounter (Signed)
Tc to the pt to schedule an appt for the pt to see Dr. Irene Limbo on 4/25 at 11:10am Pt aware to arrive at 10:45am. Letter mailed to the pt.

## 2017-03-09 NOTE — Telephone Encounter (Signed)
Yes. Can you please put a new referral for [facility]. He is scheduled at [facility] and wants to keep it as well

## 2017-03-22 ENCOUNTER — Ambulatory Visit: Payer: Federal, State, Local not specified - PPO | Admitting: Hematology

## 2017-03-23 DIAGNOSIS — C9000 Multiple myeloma not having achieved remission: Secondary | ICD-10-CM | POA: Diagnosis not present

## 2017-03-24 ENCOUNTER — Telehealth: Payer: Self-pay | Admitting: Hematology

## 2017-03-24 ENCOUNTER — Encounter: Payer: Self-pay | Admitting: Hematology

## 2017-03-24 ENCOUNTER — Ambulatory Visit (HOSPITAL_BASED_OUTPATIENT_CLINIC_OR_DEPARTMENT_OTHER): Payer: Federal, State, Local not specified - PPO

## 2017-03-24 ENCOUNTER — Ambulatory Visit (HOSPITAL_BASED_OUTPATIENT_CLINIC_OR_DEPARTMENT_OTHER): Payer: Federal, State, Local not specified - PPO | Admitting: Hematology

## 2017-03-24 VITALS — BP 126/60 | HR 65 | Temp 97.8°F | Resp 18 | Ht 68.0 in | Wt 159.0 lb

## 2017-03-24 DIAGNOSIS — M2550 Pain in unspecified joint: Secondary | ICD-10-CM | POA: Diagnosis not present

## 2017-03-24 DIAGNOSIS — Z720 Tobacco use: Secondary | ICD-10-CM

## 2017-03-24 DIAGNOSIS — E119 Type 2 diabetes mellitus without complications: Secondary | ICD-10-CM

## 2017-03-24 DIAGNOSIS — I10 Essential (primary) hypertension: Secondary | ICD-10-CM

## 2017-03-24 DIAGNOSIS — M898X9 Other specified disorders of bone, unspecified site: Secondary | ICD-10-CM

## 2017-03-24 DIAGNOSIS — M899 Disorder of bone, unspecified: Secondary | ICD-10-CM

## 2017-03-24 DIAGNOSIS — D72822 Plasmacytosis: Secondary | ICD-10-CM

## 2017-03-24 LAB — COMPREHENSIVE METABOLIC PANEL
ALBUMIN: 4.5 g/dL (ref 3.5–5.0)
ALT: 19 U/L (ref 0–55)
AST: 20 U/L (ref 5–34)
Alkaline Phosphatase: 88 U/L (ref 40–150)
Anion Gap: 10 mEq/L (ref 3–11)
BUN: 21.8 mg/dL (ref 7.0–26.0)
CHLORIDE: 105 meq/L (ref 98–109)
CO2: 26 meq/L (ref 22–29)
Calcium: 9.9 mg/dL (ref 8.4–10.4)
Creatinine: 1 mg/dL (ref 0.7–1.3)
EGFR: 82 mL/min/{1.73_m2} — AB (ref >90)
GLUCOSE: 107 mg/dL (ref 70–140)
POTASSIUM: 4.5 meq/L (ref 3.5–5.1)
SODIUM: 140 meq/L (ref 136–145)
Total Bilirubin: 0.29 mg/dL (ref 0.20–1.20)
Total Protein: 8.5 g/dL — ABNORMAL HIGH (ref 6.4–8.3)

## 2017-03-24 LAB — CBC & DIFF AND RETIC
BASO%: 0.3 % (ref 0.0–2.0)
Basophils Absolute: 0 10*3/uL (ref 0.0–0.1)
EOS ABS: 0.2 10*3/uL (ref 0.0–0.5)
EOS%: 2.6 % (ref 0.0–7.0)
HCT: 42.4 % (ref 38.4–49.9)
HEMOGLOBIN: 13.9 g/dL (ref 13.0–17.1)
Immature Retic Fract: 5.2 % (ref 3.00–10.60)
LYMPH%: 31.4 % (ref 14.0–49.0)
MCH: 28.1 pg (ref 27.2–33.4)
MCHC: 32.8 g/dL (ref 32.0–36.0)
MCV: 85.8 fL (ref 79.3–98.0)
MONO#: 0.5 10*3/uL (ref 0.1–0.9)
MONO%: 5.4 % (ref 0.0–14.0)
NEUT%: 60.3 % (ref 39.0–75.0)
NEUTROS ABS: 5.3 10*3/uL (ref 1.5–6.5)
Platelets: 234 10*3/uL (ref 140–400)
RBC: 4.94 10*6/uL (ref 4.20–5.82)
RDW: 14.1 % (ref 11.0–14.6)
RETIC %: 1.16 % (ref 0.80–1.80)
Retic Ct Abs: 57.3 10*3/uL (ref 34.80–93.90)
WBC: 8.8 10*3/uL (ref 4.0–10.3)
lymph#: 2.8 10*3/uL (ref 0.9–3.3)

## 2017-03-24 LAB — LACTATE DEHYDROGENASE: LDH: 176 U/L (ref 125–245)

## 2017-03-24 LAB — FERRITIN: FERRITIN: 85 ng/mL (ref 22–316)

## 2017-03-24 NOTE — Progress Notes (Signed)
Donald Jennings    HEMATOLOGY/ONCOLOGY CONSULTATION NOTE  Date of Service: 03/24/2017  Patient Care Team: Janith Lima, MD as PCP - General (Internal Medicine)  CHIEF COMPLAINTS/PURPOSE OF CONSULTATION:  Vertebral bone lesions ?plasma cell dyscrasias  HISTORY OF PRESENTING ILLNESS:   Donald Jennings is a wonderful 61 y.o. male who has been referred to [place] by Dr .Scarlette Calico, MD for evaluation and management of Thoracic vertebral lesions r/o plasmacytoma.  Patient is originally from [place] and has a history of hypertension, diabetes-diet-controlled, dyslipidemia, coronary artery disease, chronic smoker one pack per day and amaurosis fugax.  He was apparently in his usual state of health until about 8 months ago when he noted some mid back pain. Recently was admitted with worsening back pain and chest pain on 02/17/2017. He describes the chest pain as radiating from his back and positional. EKG did not show any ischemic changes. Troponin was not elevated. His chest pain was thought to be musculoskeletal and acute coronary syndrome was ruled out. He has previously had an intermediate risk stress test last year. For his back pain he had an MRI of the thoracic spine that showed lesions in T5, T6 and T8 that were read to be concerning for metastatic disease or myeloma. Patient had biopsies of all 3 lesions which showed benign bone and marrow elements with mild polytypic plasmacytosis of 5%. Unclear pathology.  patient had a PSA which was within normal limits at 0.58. Serum free light chains and their ratio were within normal limits. LDH was within normal limits. Beta-2 microglobulin normal. SPEP did not show monoclonal protein spike. C-reactive protein borderline elevated at 1.1. Sedimentation rate within normal limits ANA comprehensive profile negative Patient was not aware if he was to PPD test positive in the past. Quantiferon TB gold assay done and was noted to be negative. Patient notes lower back pain  as well. No other acute new focal symptoms.  Upper endoscopy in October 2017 and was found to have a benign esophageal stricture which was dilated. Colonoscopy was in 2016 and did not show any acute abnormalities. Low-dose CT of the chest was done on 07/20/2016 for lung cancer screening and did not show any overt concerning lesions.     MEDICAL HISTORY:  Past Medical History:  Diagnosis Date  . CAD (coronary artery disease)   . CERVICAL RADICULOPATHY 07/02/2010   Qualifier: Diagnosis of  By: Ronnald Ramp MD, Arvid Right.   . DEPRESSION 03/21/2009   Qualifier: Diagnosis of  By: Ronnald Ramp MD, Arvid Right.   . Diabetes mellitus without complication ([place])   . GERD 06/19/2009   Qualifier: Diagnosis of  By: Ronnald Ramp MD, Arvid Right.   Donald Jennings HLD (hyperlipidemia)   . Hx of cardiovascular stress test    ETT-Myoview (03/2014):  No ischemia, EF 56%; Low Risk  . HYPERTENSION 06/19/2009   Qualifier: Diagnosis of  By: Ronnald Ramp MD, [place], RIGHT 07/02/2010   Qualifier: Diagnosis of  By: Ronnald Ramp MD, Arvid Right.   . SOB (shortness of breath) 06/10/2016  . TOBACCO USE 03/21/2009   Qualifier: Diagnosis of  By: Ronnald Ramp MD, Arvid Right.     SURGICAL HISTORY: Past Surgical History:  Procedure Laterality Date  . CATARACT EXTRACTION Bilateral    Left 2009 at [facility], Right 2004 in [place]  . COLONOSCOPY  07/25/2015   Dr Carol Ada  . INGUINAL HERNIA REPAIR Right 1986  . IR FLUORO GUIDED NEEDLE PLC ASPIRATION/INJECTION LOC  03/02/2017  . IR FLUORO GUIDED  NEEDLE PLC ASPIRATION/INJECTION LOC  03/02/2017  . IR FLUORO GUIDED NEEDLE PLC ASPIRATION/INJECTION LOC  03/02/2017  . IR RADIOLOGIST EVAL & MGMT  03/01/2017    SOCIAL HISTORY: Social History   Social History  . Marital status: Married    Spouse name: N/A  . Number of children: 1  . Years of education: N/A   Occupational History  . Scientist, clinical (histocompatibility and immunogenetics)    Social History Main Topics  . Smoking status: Current Every Day Smoker    Packs/day: 1.00    Years: 38.50      Types: Cigarettes  . Smokeless tobacco: Never Used     Comment: Currently  smokes 1/2 ppd and is considering Chantix  . Alcohol use No  . Drug use: No  . Sexual activity: Not on file   Other Topics Concern  . Not on file   Social History Narrative   Married 1 sone born [place]   2 caffeine drinks daily   Updated 07/29/2016    FAMILY HISTORY: Family History  Problem Relation Age of Onset  . Diabetes Mother   . Arthritis Mother   . Heart disease Father   . Stroke Father   . Stroke Brother   . Cancer Neg Hx   . Early death Neg Hx   . Hypertension Neg Hx   . Hyperlipidemia Neg Hx   . Kidney disease Neg Hx     ALLERGIES:  has No Known Allergies.  MEDICATIONS:  Current Outpatient Prescriptions  Medication Sig Dispense Refill  . aspirin EC 81 MG tablet Take 1 tablet (81 mg total) by mouth daily. 90 tablet 3  . ergocalciferol (DRISDOL) 50000 UNITS capsule Take 1 capsule (50,000 Units total) by mouth once a week. 4 capsule 11  . losartan (COZAAR) 100 MG tablet Take 1 tablet (100 mg total) by mouth daily. 90 tablet 3  . metoprolol succinate (TOPROL-XL) 25 MG 24 hr tablet TAKE 1 TABLET (25 MG TOTAL) BY MOUTH DAILY. 90 tablet 1  . Multiple Vitamins-Minerals (MULTIVITAMIN ADULT) TABS Take 1 tablet by mouth daily.    Donald Jennings omeprazole (PRILOSEC OTC) 20 MG tablet Take 20 mg by mouth daily.    . rosuvastatin (CRESTOR) 40 MG tablet Take 1 tablet (40 mg total) by mouth daily. 90 tablet 3   No current facility-administered medications for this visit.     REVIEW OF SYSTEMS:    10 Point review of Systems was done is negative except as noted above.  PHYSICAL EXAMINATION: ECOG PERFORMANCE STATUS: 1 - Symptomatic but completely ambulatory  . Vitals:   03/24/17 1119  BP: 126/60  Pulse: 65  Resp: 18  Temp: 97.8 F (36.6 C)   Filed Weights   03/24/17 1119  Weight: 159 lb (72.1 kg)   .Body mass index is 24.18 kg/m.  GENERAL:alert, in no acute distress and  comfortable SKIN: no acute rashes, no significant lesions EYES: conjunctiva are pink and non-injected, sclera anicteric OROPHARYNX: MMM, no exudates, no oropharyngeal erythema or ulceration NECK: supple, no JVD LYMPH:  no palpable lymphadenopathy in the cervical, axillary or inguinal regions LUNGS: clear to auscultation b/l with normal respiratory effort HEART: regular rate & rhythm ABDOMEN:  normoactive bowel sounds , non tender, not distended. Extremity: no pedal edema PSYCH: alert & oriented x 3 with fluent speech NEURO: no focal motor/sensory deficits  LABORATORY DATA:  I have reviewed the data as listed  . CBC Latest Ref Rng & Units 03/02/2017 02/18/2017 02/17/2017  WBC 4.0 -  10.5 K/uL 7.5 10.3 6.9  Hemoglobin 13.0 - 17.0 g/dL 12.6(L) 13.4 12.6(L)  Hematocrit 39.0 - 52.0 % 39.0 41.2 38.4(L)  Platelets 150 - 400 K/uL 173 221 217    . CMP Latest Ref Rng & Units 02/18/2017 02/17/2017 08/14/2016  Glucose 65 - 99 mg/dL 98 133(H) -  BUN 6 - 20 mg/dL 19 18 -  Creatinine 0.61 - 1.24 mg/dL 0.85 0.79 -  Sodium 135 - 145 mmol/L 141 139 -  Potassium 3.5 - 5.1 mmol/L 4.2 3.6 -  Chloride 101 - 111 mmol/L 106 109 -  CO2 22 - 32 mmol/L 28 27 -  Calcium 8.9 - 10.3 mg/dL 9.4 8.9 -  Total Protein 6.5 - 8.1 g/dL 7.4 - -  Total Bilirubin 0.3 - 1.2 mg/dL 0.4 - -  Alkaline Phos 38 - 126 U/L 62 - -  AST 15 - 41 U/L 20 - -  ALT 17 - 63 U/L 16(L) - 14     RADIOGRAPHIC STUDIES: I have personally reviewed the radiological images as listed and agreed with the findings in the report. Ir Fluoro Guide Ndl Plmt / Bx  Result Date: 03/03/2017 INDICATION: Severe thoracic pain secondary to compression deformities at the T5,T6 and T8. EXAM: Fluoroscopic core biopsies at T5,T6 and T8. MEDICATIONS: None. ANESTHESIA/SEDATION: Moderate (conscious) sedation was employed during this procedure. A total of Versed 3 mg and Fentanyl 75 mcg, Dilaudid 1 mg IV was administered intravenously. Moderate Sedation Time: 30  minutes. [name] level of consciousness and vital signs were monitored continuously by radiology nursing throughout the procedure under my direct supervision. FLUOROSCOPY TIME:  Fluoroscopy Time: 10 minutes 24 seconds (680 mGy). COMPLICATIONS: None immediate. PROCEDURE: Informed written consent was obtained from the patient after a thorough discussion of the procedural risks, benefits and alternatives. All questions were addressed. Maximal Sterile Barrier Technique was utilized including caps, mask, sterile gowns, sterile gloves, sterile drape, hand hygiene and skin antiseptic. A timeout was performed prior to the initiation of the procedure. The patient was laid prone on the fluoroscopic table. The skin over the thoracic region was then prepped and draped in the usual sterile fashion. The T5, T6 and T8 vertebral bodies were identified in the AP and lateral projections. The skin overlying the right pedicle at T8, the left pedicle at T6 and the right pedicle at T5 was then infiltrated with 0.25% bupivacaine and carried to the respective pedicles. Thereafter, using biplane intermittent fluoroscopy, 13 gauge Cook spinal needles were then advanced into the T5,T6 and T8 vertebral bodies. Using biplane intermittent fluoroscopy, core biopsies were obtained at each of the 3 levels with separate sterile 16 gauge core biopsy needles. Two passes were made at each of the 3 levels. The specimens were then put in separate vials and sent for pathologic analysis. The needles were removed. Hemostasis was achieved at the skin entry site. The patient tolerated the procedure well. There were no acute complications. The patient was then transported to the short-stay area for further observations. IMPRESSION: Status post fluoroscopic guided deep core biopsies at T5, T6 and T8 as described above without event. The results of the biopsy specimens are to be sent to the patient's primary referring doctor, Dr. Ronnald Ramp. The patient and the  spouse were informed to call Dr. Ronnald Ramp' office in about 48 hours to find out about the biopsy reports. Questions were answered to their satisfaction. They both leave with good understanding and agreement with the above management plan. Electronically Signed   By: Willaim Rayas  Deveshwar M.D.   On: 03/02/2017 13:35   Ir Fluoro Guide Ndl Plmt / Bx  Result Date: 03/03/2017 INDICATION: Severe thoracic pain secondary to compression deformities at the T5,T6 and T8. EXAM: Fluoroscopic core biopsies at T5,T6 and T8. MEDICATIONS: None. ANESTHESIA/SEDATION: Moderate (conscious) sedation was employed during this procedure. A total of Versed 3 mg and Fentanyl 75 mcg, Dilaudid 1 mg IV was administered intravenously. Moderate Sedation Time: 30 minutes. [name] level of consciousness and vital signs were monitored continuously by radiology nursing throughout the procedure under my direct supervision. FLUOROSCOPY TIME:  Fluoroscopy Time: 10 minutes 24 seconds (680 mGy). COMPLICATIONS: None immediate. PROCEDURE: Informed written consent was obtained from the patient after a thorough discussion of the procedural risks, benefits and alternatives. All questions were addressed. Maximal Sterile Barrier Technique was utilized including caps, mask, sterile gowns, sterile gloves, sterile drape, hand hygiene and skin antiseptic. A timeout was performed prior to the initiation of the procedure. The patient was laid prone on the fluoroscopic table. The skin over the thoracic region was then prepped and draped in the usual sterile fashion. The T5, T6 and T8 vertebral bodies were identified in the AP and lateral projections. The skin overlying the right pedicle at T8, the left pedicle at T6 and the right pedicle at T5 was then infiltrated with 0.25% bupivacaine and carried to the respective pedicles. Thereafter, using biplane intermittent fluoroscopy, 13 gauge Cook spinal needles were then advanced into the T5,T6 and T8 vertebral bodies.  Using biplane intermittent fluoroscopy, core biopsies were obtained at each of the 3 levels with separate sterile 16 gauge core biopsy needles. Two passes were made at each of the 3 levels. The specimens were then put in separate vials and sent for pathologic analysis. The needles were removed. Hemostasis was achieved at the skin entry site. The patient tolerated the procedure well. There were no acute complications. The patient was then transported to the short-stay area for further observations. IMPRESSION: Status post fluoroscopic guided deep core biopsies at T5, T6 and T8 as described above without event. The results of the biopsy specimens are to be sent to the patient's primary referring doctor, Dr. Ronnald Ramp. The patient and the spouse were informed to call Dr. Ronnald Ramp' office in about 48 hours to find out about the biopsy reports. Questions were answered to their satisfaction. They both leave with good understanding and agreement with the above management plan. Electronically Signed   By: Luanne Bras M.D.   On: 03/02/2017 13:35   Ir Fluoro Guide Ndl Plmt / Bx  Result Date: 03/03/2017 INDICATION: Severe thoracic pain secondary to compression deformities at the T5,T6 and T8. EXAM: Fluoroscopic core biopsies at T5,T6 and T8. MEDICATIONS: None. ANESTHESIA/SEDATION: Moderate (conscious) sedation was employed during this procedure. A total of Versed 3 mg and Fentanyl 75 mcg, Dilaudid 1 mg IV was administered intravenously. Moderate Sedation Time: 30 minutes. [name] level of consciousness and vital signs were monitored continuously by radiology nursing throughout the procedure under my direct supervision. FLUOROSCOPY TIME:  Fluoroscopy Time: 10 minutes 24 seconds (680 mGy). COMPLICATIONS: None immediate. PROCEDURE: Informed written consent was obtained from the patient after a thorough discussion of the procedural risks, benefits and alternatives. All questions were addressed. Maximal Sterile Barrier  Technique was utilized including caps, mask, sterile gowns, sterile gloves, sterile drape, hand hygiene and skin antiseptic. A timeout was performed prior to the initiation of the procedure. The patient was laid prone on the fluoroscopic table. The skin over the thoracic  region was then prepped and draped in the usual sterile fashion. The T5, T6 and T8 vertebral bodies were identified in the AP and lateral projections. The skin overlying the right pedicle at T8, the left pedicle at T6 and the right pedicle at T5 was then infiltrated with 0.25% bupivacaine and carried to the respective pedicles. Thereafter, using biplane intermittent fluoroscopy, 13 gauge Cook spinal needles were then advanced into the T5,T6 and T8 vertebral bodies. Using biplane intermittent fluoroscopy, core biopsies were obtained at each of the 3 levels with separate sterile 16 gauge core biopsy needles. Two passes were made at each of the 3 levels. The specimens were then put in separate vials and sent for pathologic analysis. The needles were removed. Hemostasis was achieved at the skin entry site. The patient tolerated the procedure well. There were no acute complications. The patient was then transported to the short-stay area for further observations. IMPRESSION: Status post fluoroscopic guided deep core biopsies at T5, T6 and T8 as described above without event. The results of the biopsy specimens are to be sent to the patient's primary referring doctor, Dr. Ronnald Ramp. The patient and the spouse were informed to call Dr. Ronnald Ramp' office in about 48 hours to find out about the biopsy reports. Questions were answered to their satisfaction. They both leave with good understanding and agreement with the above management plan. Electronically Signed   By: Luanne Bras M.D.   On: 03/02/2017 13:35   Ir Radiologist Eval & Mgmt  Result Date: 03/03/2017 EXAM: NEW PATIENT OFFICE VISIT CHIEF COMPLAINT: Severe thoracic pain. Current Pain Level: 1-10  HISTORY OF PRESENT ILLNESS: The patient is a 61 year old gentleman who has been referred for evaluation of management of significant pain due to abnormalities seen at T5, T6 and T8. The patient is accompanied by his spouse and daughter. According to them, the patient's symptoms of back pain in the thoracic spine have been gradually getting worse over the past 1-2 months. The patient apparently experienced a sudden sharp pain in the thoracic spine whilst working as a Doctor, general practice about a month ago. Since that time, the patient has had this nagging pain in the thoracic region sometimes radiating into his shoulders. The pain also has a circumferential radiation more so on the left than on the right. The patient reports worsening of pain on stooping or lifting. The patient reports the pain to be a 7 out of 10 when severe which is relieved to a 2 out of 10 to 3 out of 10 following intake of Mobic or of ibuprofen. The patient denies any autonomic dysfunction of his bowel or bladder activities. He denies any recent chills, fever or rigors. He reports his appetite to be normal.  His weight is steady. The patient is able to ambulate with mild difficulty. The patient is also unable to launch his jaw as of right now. The patient also reports a history of amaurosis fugax in the left eye a few months ago which lasted a few seconds. Subsequent workup revealed a left vertebral artery. The patient has been scheduled to see a neurologist sometime in the future. The patient denies any symptoms of dysuria, hematuria or of polyuria. Past Medical History: Diabetes diagnosed recently treated with the dietary control. High blood pressure, high cholesterol, and reflux indigestion. Past Surgical History:  Cataract surgery, hernia repair in 1986. Medications: Metoprolol, losartan, Prilosec rosuvastatin and aspirin 81 mg a day. Allergies: No known allergies. Social History: Married. One son alive and well. The patient works  as a Doctor, general practice and drinks  occasionally. He smokes up to a pack of cigarettes a day, has done so for about 40 years. Denies using illicit chemicals. Family History: Mother deceased at age 38 from complications of diabetes with kidney failure. Father deceased age 38 of heart disease. REVIEW OF SYSTEMS: Negative except for as mentioned above. PHYSICAL EXAMINATION: Appears in mild distress.  Affect appropriate. Neurologically no lateralizing features, motor, sensory or coordination. Station and gait within normal limits. Patient exquisitely tender in the interscapular area at the T5, T6 and T8 levels. ASSESSMENT AND PLAN: The imaging findings were reviewed with him and his family. The abnormal marrow signal at T5, T6 and T8 was the brought to their attention. The etiology of these abnormal appearances at the 3 levels mentioned above are nonspecific at this time. Further evaluation with biopsies of these lesions would probably be helpful in this regard. [name] workup so far in terms of lab work has been negative as far as is known. The biopsy procedure was discussed with the patient and the patient's spouse. Questions were answered to their satisfaction. Risks and benefits were reviewed. The patient would like to proceed with biopsies to be performed at the 3 levels as mentioned above to increase the yield of whatever pathologic process that may be evolving. Questions were answered to their satisfaction. Procedure will be scheduled as early as possible. They were asked to call should they have any concerns or questions. Electronically Signed   By: Luanne Bras M.D.   On: 03/01/2017 20:53   MRI T spine IMPRESSION: Suspect lesions in the T5, T6 and T8 vertebral bodies suspicious for metastasis or myeloma. Recommend metastatic workup. No pathologic fracture.  Normal thoracic spinal cord.  Very shallow left paracentral disc protrusions at T6-7 and T7-8.  These results will be called to the ordering clinician  or representative by the Radiologist Assistant, and communication documented in the PACS or zVision Dashboard.   Electronically Signed   By: Marijo Sanes M.D.   On: 02/18/2017 13:34  IMPRESSION: 1. Previously identified lesions involving the T5, T6, and T8 vertebral bodies demonstrate diffuse postcontrast enhancement. Again, findings are suspicious for possible metastases or myeloma. 2. No other focal lesions or abnormal enhancement identified within the thoracic spine.   Electronically Signed   By: Jeannine Boga M.D.   On: 02/18/2017 19:16  ASSESSMENT & PLAN:   61 year old male originally from [place] with   1) diffusely enhancing lesions involving T5, T6 and T8 vertebral bodies with back pain. Biopsy as noted above showed only benign bone and marrow elements with mild polytypic plasmacytosis. Despite all the 3 lesions being biopsied that is no evidence of carcinoma or overt plasmacytoma.  Would need to consider other reactive processes PSA within normal limits Upper endoscopy in October 2017 and was found to have a benign esophageal stricture which was dilated. Colonoscopy was in 2016 and did not show any acute abnormalities. Low-dose CT of the chest was done on 07/20/2016 for lung cancer screening and did not show any overt concerning lesions.  Plan -SPEP, serum free light chains, LDH and beta-2 microglobulin are unrevealing with regards to a plasma cell dyscrasia. -Patient sedimentation rate and CRP are not significantly elevated to suggest a chronic osteomyelitis. -ANA profile negative -Quantiferon Gold TB assay - negative -We'll discuss his imaging and pathology at the hematology case conference on 03/30/2017 for additional ideas of possible pathology and to determine if additional testing can be done on his tissue  samples to determine the cause of his reactive plasmacytosis. -We'll get a PET CT scan to determine if there is any other involvement all bones or  evidence of other primary tumor.  2) . Patient Active Problem List   Diagnosis Date Noted  . Solitary plasmacytoma of bone ([place]) 03/05/2017  . Lytic bone lesions on xray 02/22/2017  . Vertebral artery stenosis, asymptomatic 06/22/2016  . Amaurosis fugax 06/03/2016  . Hyperlipidemia with target LDL less than 100 06/03/2016  . Abnormal thallium stress test 05/20/2016  . Dysphagia 05/18/2016  . Neuropathy of both feet 05/28/2015  . Eczema, dyshidrotic 05/27/2015  . Type 2 diabetes mellitus with manifestations ([place]) 03/27/2015  . Routine general medical examination at a health care facility 04/04/2013  . Vitamin D deficiency 04/04/2013  . Essential hypertension 06/19/2009  . GERD 06/19/2009  . TOBACCO USE 03/21/2009  . ELECTROCARDIOGRAM, ABNORMAL 03/21/2009  Plan -Continue follow-up with primary care physician for continued management  3) Cigarete/tobacco smoking -Counseled extensively on the need for smoking cessation  Labs today PET/CT in 1week RTC with Dr Irene Limbo in 1 week after PET/CT  All of the patients questions were answered with apparent satisfaction. The patient knows to call the clinic with any problems, questions or concerns.  I spent 45 minutes counseling the patient face to face. The total time spent in the appointment was 60 minutes and more than 50% was on counseling and direct patient cares.    Sullivan Lone MD [place] AAHIVMS [facility] Hematology/Oncology [facility]  (Office):       [phone] (Work cell):  [phone] (Fax):           [phone]  03/24/2017 11:24 AM

## 2017-03-24 NOTE — Patient Instructions (Signed)
Thank you for choosing [facility] to provide your oncology and hematology care.  To afford each patient quality time with our providers, please arrive 30 minutes before your scheduled appointment time.  If you arrive late for your appointment, you may be asked to reschedule.  We strive to give you quality time with our providers, and arriving late affects you and other patients whose appointments are after yours.  If you are a no show for multiple scheduled visits, you may be dismissed from the clinic at the providers discretion.   Again, thank you for choosing [facility], our hope is that these requests will decrease the amount of time that you wait before being seen by our physicians.  ______________________________________________________________________ Should you have questions after your visit to the [facility], please contact our office at [phone] between the hours of 8:30 and 4:30 p.m.    Voicemails left after 4:30p.m will not be returned until the following business day.   For prescription refill requests, please have your pharmacy contact us directly.  Please also try to allow 48 hours for prescription requests.   Please contact the scheduling department for questions regarding scheduling.  For scheduling of procedures such as PET scans, CT scans, MRI, Ultrasound, etc please contact central scheduling at [phone].   Resources For Cancer Patients and Caregivers:  American Cancer Society:  800-227-2345  Can help patients locate various types of support and financial assistance Cancer Care: 1-800-813-HOPE (4673) Provides financial assistance, online support groups, medication/co-pay assistance.   [place] DSS:  336-641-3447 Where to apply for food stamps, Medicaid, and utility assistance Medicare Rights Center: 800-333-4114 Helps people with Medicare understand their rights and benefits, navigate the Medicare system, and secure the  quality healthcare they deserve SCAT: 336-333-6589 [employer]'s shared-ride transportation service for eligible riders who have a disability that prevents them from riding the fixed route bus.   For additional information on assistance programs please contact our social worker:   Grier Hock/Abigail Elmore:  [phone] 

## 2017-03-24 NOTE — Telephone Encounter (Signed)
Gave patient AVS and calender per 4/25 los - [facility] Radiology to contact patient with Pet Scan schedule.

## 2017-03-25 LAB — TRYPTASE: Tryptase: 6.1 ug/L (ref 2.2–13.2)

## 2017-03-25 LAB — KAPPA/LAMBDA LIGHT CHAINS
Ig Kappa Free Light Chain: 20.7 mg/L — ABNORMAL HIGH (ref 3.3–19.4)
Ig Lambda Free Light Chain: 17.1 mg/L (ref 5.7–26.3)
Kappa/Lambda FluidC Ratio: 1.21 (ref 0.26–1.65)

## 2017-03-25 LAB — ANA COMPREHENSIVE PANEL
Anti JO-1: <0.2 AI (ref 0.0–0.9)
Anti-Centromere B Antibodies: <0.2 AI (ref 0.0–0.9)
Chromatin Ab SerPl-aCnc: <0.2 AI (ref 0.0–0.9)
DSDNA AB: 2 [IU]/mL (ref 0–9)
ENA RNP Ab: 0.5 AI (ref 0.0–0.9)
ENA SSA (RO) Ab: <0.2 AI (ref 0.0–0.9)
ENA SSB (LA) Ab: <0.2 AI (ref 0.0–0.9)
SMITH ANTIBODIES: 0.7 AI (ref 0.0–0.9)

## 2017-03-25 LAB — SEDIMENTATION RATE: Sedimentation Rate-Westergren: 19 mm/hr (ref 0–30)

## 2017-03-25 LAB — HIV ANTIBODY (ROUTINE TESTING W REFLEX): HIV Screen 4th Generation wRfx: NONREACTIVE

## 2017-03-25 LAB — C-REACTIVE PROTEIN: CRP: 1.9 mg/L (ref 0.0–4.9)

## 2017-03-26 LAB — RHEUMATOID ARTHRITIS PROFILE
CCP Antibodies IgG/IgA: 8 units (ref 0–19)
RA Latex Turbid.: <10 IU/mL (ref 0.0–13.9)

## 2017-03-26 LAB — ANTINUCLEAR ANTIBODIES, IFA: ANTINUCLEAR ANTIBODIES, IFA: NEGATIVE

## 2017-03-29 DIAGNOSIS — D72822 Plasmacytosis: Secondary | ICD-10-CM | POA: Diagnosis not present

## 2017-03-29 LAB — QUANTIFERON TB GOLD ASSAY (BLOOD)
QFT TB AG MINUS NIL VALUE: 0.04 [IU]/mL
QUANTIFERON MITOGEN VALUE: 5.18 IU/mL
QUANTIFERON TB AG VALUE: 0.05 [IU]/mL
QUANTIFERON TB GOLD: NEGATIVE
QuantiFERON Nil Value: 0.01 IU/mL

## 2017-03-30 LAB — UPEP/TP, 24-HR URINE
ALPHA-1-GLOBULIN, U: 0.9 %
ALPHA-2-GLOBULIN, U: 13.6 %
Albumin, U: 33.4 %
Beta Globulin, U: 33.9 %
GAMMA GLOBULIN, U: 18.3 %
PROTEIN,TOTAL,URINE: 7 mg/dL
Prot,24hr calculated: 186 mg/24 hr — ABNORMAL HIGH (ref 30–150)

## 2017-03-30 LAB — MULTIPLE MYELOMA PANEL, SERUM
ALBUMIN SERPL ELPH-MCNC: 4.6 g/dL — AB (ref 2.9–4.4)
ALBUMIN/GLOB SERPL: 1.4 (ref 0.7–1.7)
Alpha 1: 0.2 g/dL (ref 0.0–0.4)
Alpha2 Glob SerPl Elph-Mcnc: 0.8 g/dL (ref 0.4–1.0)
B-GLOBULIN SERPL ELPH-MCNC: 1.2 g/dL (ref 0.7–1.3)
GAMMA GLOB SERPL ELPH-MCNC: 1.2 g/dL (ref 0.4–1.8)
GLOBULIN, TOTAL: 3.4 g/dL (ref 2.2–3.9)
IGA/IMMUNOGLOBULIN A, SERUM: 259 mg/dL (ref 90–386)
IgG, Qn, Serum: 1257 mg/dL (ref 700–1600)
IgM, Qn, Serum: 65 mg/dL (ref 20–172)
Total Protein: 8 g/dL (ref 6.0–8.5)

## 2017-04-05 ENCOUNTER — Other Ambulatory Visit: Payer: Self-pay | Admitting: Internal Medicine

## 2017-04-05 DIAGNOSIS — E559 Vitamin D deficiency, unspecified: Secondary | ICD-10-CM

## 2017-04-06 ENCOUNTER — Encounter (HOSPITAL_COMMUNITY)
Admission: RE | Admit: 2017-04-06 | Discharge: 2017-04-06 | Disposition: A | Discharge status: Home / Self Care | Payer: Federal, State, Local not specified - PPO | Source: Ambulatory Visit | Attending: Hematology | Admitting: Hematology

## 2017-04-06 DIAGNOSIS — I2510 Atherosclerotic heart disease of native coronary artery without angina pectoris: Secondary | ICD-10-CM | POA: Diagnosis not present

## 2017-04-06 DIAGNOSIS — M899 Disorder of bone, unspecified: Secondary | ICD-10-CM | POA: Diagnosis not present

## 2017-04-06 LAB — GLUCOSE, CAPILLARY: Glucose-Capillary: 111 mg/dL — ABNORMAL HIGH (ref 65–99)

## 2017-04-06 MED ORDER — FLUDEOXYGLUCOSE F - 18 (FDG) INJECTION
7.9000 | Freq: Once | INTRAVENOUS | Status: AC
  Administered 2017-04-06: 7.9 via INTRAVENOUS

## 2017-04-07 ENCOUNTER — Encounter: Payer: Self-pay | Admitting: Hematology

## 2017-04-07 ENCOUNTER — Ambulatory Visit (HOSPITAL_BASED_OUTPATIENT_CLINIC_OR_DEPARTMENT_OTHER): Payer: Federal, State, Local not specified - PPO | Admitting: Hematology

## 2017-04-07 ENCOUNTER — Telehealth: Payer: Self-pay | Admitting: Hematology

## 2017-04-07 VITALS — BP 125/75 | HR 65 | Temp 97.8°F | Resp 18 | Ht 68.0 in | Wt 158.3 lb

## 2017-04-07 DIAGNOSIS — F17200 Nicotine dependence, unspecified, uncomplicated: Secondary | ICD-10-CM | POA: Diagnosis not present

## 2017-04-07 DIAGNOSIS — M899 Disorder of bone, unspecified: Secondary | ICD-10-CM | POA: Diagnosis not present

## 2017-04-07 MED ORDER — NICOTINE 7 MG/24HR TD PT24: 7.0000 mg | MEDICATED_PATCH | Freq: Every day | TRANSDERMAL | 0 refills | Status: AC

## 2017-04-07 MED ORDER — NICOTINE 21 MG/24HR TD PT24: 21.0000 mg | MEDICATED_PATCH | Freq: Every day | TRANSDERMAL | 0 refills | Status: AC

## 2017-04-07 MED ORDER — NICOTINE 14 MG/24HR TD PT24: 14.0000 mg | MEDICATED_PATCH | Freq: Every day | TRANSDERMAL | 0 refills | Status: AC

## 2017-04-07 NOTE — Telephone Encounter (Signed)
Scheduled appt per 5/9 los. - Patient aware of appt date and time . [facility] to contact patient with MRI schedule - patient aware.

## 2017-04-07 NOTE — Progress Notes (Signed)
[name]    HEMATOLOGY/ONCOLOGY CLINIC NOTE  Date of Service: 04/07/2017  Patient Care Team: Janith Lima, MD as PCP - General (Internal Medicine)  CHIEF COMPLAINTS/PURPOSE OF CONSULTATION:  Vertebral bone lesions ?plasma cell dyscrasias  HISTORY OF PRESENTING ILLNESS:   Donald Jennings is a wonderful 61 y.o. male who has been referred to [place] by Dr .Ronnald Ramp, Arvid Right, MD for evaluation and management of Thoracic vertebral lesions r/o plasmacytoma.  Patient is originally from [place] and has a history of hypertension, diabetes-diet-controlled, dyslipidemia, coronary artery disease, chronic smoker one pack per day and amaurosis fugax.  He was apparently in his usual state of health until about 8 months ago when he noted some mid back pain. Recently was admitted with worsening back pain and chest pain on 02/17/2017. He describes the chest pain as radiating from his back and positional. EKG did not show any ischemic changes. Troponin was not elevated. His chest pain was thought to be musculoskeletal and acute coronary syndrome was ruled out. He has previously had an intermediate risk stress test last year. For his back pain he had an MRI of the thoracic spine that showed lesions in T5, T6 and T8 that were read to be concerning for metastatic disease or myeloma. Patient had biopsies of all 3 lesions which showed benign bone and marrow elements with mild polytypic plasmacytosis of 5%. Unclear pathology.  patient had a PSA which was within normal limits at 0.58. Serum free light chains and their ratio were within normal limits. LDH was within normal limits. Beta-2 microglobulin normal. SPEP did not show monoclonal protein spike. C-reactive protein borderline elevated at 1.1. Sedimentation rate within normal limits ANA comprehensive profile negative Patient was not aware if he was to PPD test positive in the past. Quantiferon TB gold assay done and was noted to be negative. Patient notes lower back pain as  well. No other acute new focal symptoms.  Upper endoscopy in October 2017 and was found to have a benign esophageal stricture which was dilated. Colonoscopy was in 2016 and did not show any acute abnormalities. Low-dose CT of the chest was done on 07/20/2016 for lung cancer screening and did not show any overt concerning lesions.  INTERVAL HISTORY  Patient is here to follow up on his lab workup and PET/CT scan results. His pathology results were discussed in our hematology tumor Board's and were noted to only showed reactive changes with no evidence of plasmacytoma. PET/CT scan shows no other significant bone lesions and shows that the thoracic spine lesions at T5, T6 and T8 are not significantly FDG avid. Patient has no acute new concerns. His TB quantified on assay was negative. Other lab workup was unrevealing.   MEDICAL HISTORY:  Past Medical History:  Diagnosis Date  . CAD (coronary artery disease)   . CERVICAL RADICULOPATHY 07/02/2010   Qualifier: Diagnosis of  By: Ronnald Ramp MD, Arvid Right.   . DEPRESSION 03/21/2009   Qualifier: Diagnosis of  By: Ronnald Ramp MD, Arvid Right.   . Diabetes mellitus without complication ([place])   . GERD 06/19/2009   Qualifier: Diagnosis of  By: Ronnald Ramp MD, Arvid Right.   [name] HLD (hyperlipidemia)   . Hx of cardiovascular stress test    ETT-Myoview (03/2014):  No ischemia, EF 56%; Low Risk  . HYPERTENSION 06/19/2009   Qualifier: Diagnosis of  By: Ronnald Ramp MD, [place], RIGHT 07/02/2010   Qualifier: Diagnosis of  By: Ronnald Ramp MD, Arvid Right.   . SOB (  shortness of breath) 06/10/2016  . TOBACCO USE 03/21/2009   Qualifier: Diagnosis of  By: Ronnald Ramp MD, Arvid Right.     SURGICAL HISTORY: Past Surgical History:  Procedure Laterality Date  . CATARACT EXTRACTION Bilateral    Left 2009 at [facility], Right 2004 in [place]  . COLONOSCOPY  07/25/2015   Dr Carol Ada  . INGUINAL HERNIA REPAIR Right 1986  . IR FLUORO GUIDED NEEDLE PLC ASPIRATION/INJECTION LOC  03/02/2017  . IR  FLUORO GUIDED NEEDLE PLC ASPIRATION/INJECTION LOC  03/02/2017  . IR FLUORO GUIDED NEEDLE PLC ASPIRATION/INJECTION LOC  03/02/2017  . IR RADIOLOGIST EVAL & MGMT  03/01/2017    SOCIAL HISTORY: Social History   Social History  . Marital status: Married    Spouse name: N/A  . Number of children: 1  . Years of education: N/A   Occupational History  . Scientist, clinical (histocompatibility and immunogenetics)    Social History Main Topics  . Smoking status: Current Every Day Smoker    Packs/day: 1.00    Years: 38.50    Types: Cigarettes  . Smokeless tobacco: Never Used     Comment: Currently  smokes 1/2 ppd and is considering Chantix  . Alcohol use No  . Drug use: No  . Sexual activity: Not on file   Other Topics Concern  . Not on file   Social History Narrative   Married 1 sone born [place]   2 caffeine drinks daily   Updated 07/29/2016    FAMILY HISTORY: Family History  Problem Relation Age of Onset  . Diabetes Mother   . Arthritis Mother   . Heart disease Father   . Stroke Father   . Stroke Brother   . Cancer Neg Hx   . Early death Neg Hx   . Hypertension Neg Hx   . Hyperlipidemia Neg Hx   . Kidney disease Neg Hx     ALLERGIES:  has No Known Allergies.  MEDICATIONS:  Current Outpatient Prescriptions  Medication Sig Dispense Refill  . aspirin EC 81 MG tablet Take 1 tablet (81 mg total) by mouth daily. 90 tablet 3  . losartan (COZAAR) 100 MG tablet Take 1 tablet (100 mg total) by mouth daily. 90 tablet 3  . metoprolol succinate (TOPROL-XL) 25 MG 24 hr tablet TAKE 1 TABLET (25 MG TOTAL) BY MOUTH DAILY. 90 tablet 1  . Multiple Vitamins-Minerals (MULTIVITAMIN ADULT) TABS Take 1 tablet by mouth daily.    [name] omeprazole (PRILOSEC OTC) 20 MG tablet Take 20 mg by mouth daily.    . rosuvastatin (CRESTOR) 40 MG tablet Take 1 tablet (40 mg total) by mouth daily. (Patient not taking: Reported on 03/24/2017) 90 tablet 3  . Vitamin D, Ergocalciferol, (DRISDOL) 50000 units CAPS capsule TAKE 1 CAPSULE  (50,000 UNITS TOTAL) BY MOUTH ONCE A WEEK. 4 capsule 7   No current facility-administered medications for this visit.     REVIEW OF SYSTEMS:    10 Point review of Systems was done is negative except as noted above.  PHYSICAL EXAMINATION: ECOG PERFORMANCE STATUS: 1 - Symptomatic but completely ambulatory  . Vitals:   04/07/17 1012  BP: 125/75  Pulse: 65  Resp: 18  Temp: 97.8 F (36.6 C)   Filed Weights   04/07/17 1012  Weight: 158 lb 4.8 oz (71.8 kg)   .Body mass index is 24.07 kg/m.  GENERAL:alert, in no acute distress and comfortable SKIN: no acute rashes, no significant lesions EYES: conjunctiva are pink and non-injected, sclera  anicteric OROPHARYNX: MMM, no exudates, no oropharyngeal erythema or ulceration NECK: supple, no JVD LYMPH:  no palpable lymphadenopathy in the cervical, axillary or inguinal regions LUNGS: clear to auscultation b/l with normal respiratory effort HEART: regular rate & rhythm ABDOMEN:  normoactive bowel sounds , non tender, not distended. Extremity: no pedal edema PSYCH: alert & oriented x 3 with fluent speech NEURO: no focal motor/sensory deficits  LABORATORY DATA:  I have reviewed the data as listed  . CBC Latest Ref Rng & Units 03/24/2017 03/02/2017 02/18/2017  WBC 4.0 - 10.3 10e3/uL 8.8 7.5 10.3  Hemoglobin 13.0 - 17.1 g/dL 13.9 12.6(L) 13.4  Hematocrit 38.4 - 49.9 % 42.4 39.0 41.2  Platelets 140 - 400 10e3/uL 234 173 221    . CMP Latest Ref Rng & Units 03/24/2017 03/24/2017 02/18/2017  Glucose 70 - 140 mg/dl 107 - 98  BUN 7.0 - 26.0 mg/dL 21.8 - 19  Creatinine 0.7 - 1.3 mg/dL 1.0 - 0.85  Sodium 136 - 145 mEq/L 140 - 141  Potassium 3.5 - 5.1 mEq/L 4.5 - 4.2  Chloride 101 - 111 mmol/L - - 106  CO2 22 - 29 mEq/L 26 - 28  Calcium 8.4 - 10.4 mg/dL 9.9 - 9.4  Total Protein 6.0 - 8.5 g/dL 8.5(H) 8.0 7.4  Total Bilirubin 0.20 - 1.20 mg/dL 0.29 - 0.4  Alkaline Phos 40 - 150 U/L 88 - 62  AST 5 - 34 U/L 20 - 20  ALT 0 - 55 U/L 19 - 16(L)     Component     Latest Ref Rng & Units 03/24/2017 03/29/2017  IgG (Immunoglobin G), Serum     700 - 1600 mg/dL 1,257   IgA/Immunoglobulin A, Serum     90 - 386 mg/dL 259   IgM, Qn, Serum     20 - 172 mg/dL 65   Total Protein     6.0 - 8.5 g/dL 8.0   Albumin SerPl Elph-Mcnc     2.9 - 4.4 g/dL 4.6 (H)   Alpha 1     0.0 - 0.4 g/dL 0.2   Alpha2 Glob SerPl Elph-Mcnc     0.4 - 1.0 g/dL 0.8   B-Globulin SerPl Elph-Mcnc     0.7 - 1.3 g/dL 1.2   Gamma Glob SerPl Elph-Mcnc     0.4 - 1.8 g/dL 1.2   M Protein SerPl Elph-Mcnc     Not Observed g/dL Not Observed   Globulin, Total     2.2 - 3.9 g/dL 3.4   Albumin/Glob SerPl     0.7 - 1.7 1.4   IFE 1      Comment   Please Note (HCV):      Comment   dsDNA Ab     0 - 9 IU/mL 2   ENA RNP Ab     0.0 - 0.9 AI 0.5   Smith Antibodies     0.0 - 0.9 AI 0.7   Scleroderma SCL-70     0.0 - 0.9 AI <0.2   ENA SSA (RO) Ab     0.0 - 0.9 AI <0.2   ENA SSB (LA) Ab     0.0 - 0.9 AI <0.2   Chromatin Ab SerPl-aCnc     0.0 - 0.9 AI <0.2   Anti JO-1     0.0 - 0.9 AI <0.2   CENTROMERE AB SCREEN     0.0 - 0.9 AI <0.2   SEE BELOW      Comment   Protein  Urine Random     Not Estab. mg/dL  7.0  Prot,24hr calculated     30 - 150 mg/24 hr  186 (H)  ALBUMIN, U     %  33.4  Alpha-1-Globulin, U     %  0.9  ALPHA-2-GLOBULIN, U     %  13.6  Beta Globulin, U     %  33.9  Gamma Globulin, U     %  18.3  M-spike, %     Not Observed %  Not Observed  Please Note:       Comment  QUANTIFERON INCUBATION      Comment   QUANTIFERON TB GOLD     Negative Negative   QUANTIFERON CRITERIA      Comment   QUANTIFERON TB AG VALUE     IU/mL 0.05   Quantiferon Nil Value     IU/mL 0.01   QUANTIFERON MITOGEN VALUE     IU/mL 5.18   QFT TB AG MINUS NIL VALUE     IU/mL 0.04   Interpretation      Comment   Ig Kappa Free Light Chain     3.3 - 19.4 mg/L 20.7 (H)   Ig Lambda Free Light Chain     5.7 - 26.3 mg/L 17.1   Kappa/Lambda FluidC Ratio     0.26 - 1.65  1.21   RA Latex Turbid.     0.0 - 13.9 IU/mL <10.0   CCP Antibodies IgG/IgA     0 - 19 units 8   LDH     125 - 245 U/L 176   ANA Ab, IFA      Negative   Sed Rate     0 - 30 mm/hr 19   CRP     0.0 - 4.9 mg/L 1.9   Tryptase     2.2 - 13.2 ug/L 6.1   HIV     Non Reactive Non Reactive   Ferritin     22 - 316 ng/ml 85      RADIOGRAPHIC STUDIES: I have personally reviewed the radiological images as listed and agreed with the findings in the report. Nm Pet Image Initial (pi) Whole Body  Result Date: 04/06/2017 CLINICAL DATA:  Initial treatment strategy for lytic bony lesions in the thoracic spine, plasmacytoma versus metastatic disease. EXAM: NUCLEAR MEDICINE PET WHOLE BODY TECHNIQUE: 7.9 mCi F-18 FDG was injected intravenously. Full-ring PET imaging was performed from the vertex to the feet after the radiotracer. CT data was obtained and used for attenuation correction and anatomic localization. FASTING BLOOD GLUCOSE:  Value: 111 mg/dl COMPARISON:  Multiple exams, including 02/18/2017 FINDINGS: HEAD/NECK No hypermetabolic activity in the scalp. No hypermetabolic cervical lymph nodes. CHEST Coronary, aortic arch, and branch vessel atherosclerotic vascular disease. Biapical pleuroparenchymal scarring. No pathologic adenopathy in the chest. ABDOMEN/PELVIS No abnormal hypermetabolic activity within the liver, pancreas, adrenal glands, or spleen. No hypermetabolic lymph nodes in the abdomen or pelvis. Aortoiliac atherosclerotic vascular disease. Mildly prominent prostate gland. SKELETON No focal hypermetabolic activity to suggest skeletal metastasis. EXTREMITIES No abnormal hypermetabolic activity in the lower extremities. IMPRESSION: 1. The lesions at T5, T6, and T8 do not have hypermetabolic activity, and there are no other bony lesions identified. It may well be that these lesions on MRI are primarily due to degenerative endplate findings, with the unusual feature of being somewhat anteriorly  positioned. There is no lucent lesion in this vicinity. Presumably other workup including serology does not indicate further concern, the MRI  findings may be suitable for MRI thoracic spine follow up to ensure lack of progression. 2.  Aortic Atherosclerosis (ICD10-I70.0).  Coronary atherosclerosis. 3. Mildly prominent prostate gland. Electronically Signed   By: Van Clines M.D.   On: 04/06/2017 15:28   MRI T spine IMPRESSION: Suspect lesions in the T5, T6 and T8 vertebral bodies suspicious for metastasis or myeloma. Recommend metastatic workup. No pathologic fracture.  Normal thoracic spinal cord.  Very shallow left paracentral disc protrusions at T6-7 and T7-8.  These results will be called to the ordering clinician or representative by the Radiologist Assistant, and communication documented in the PACS or zVision Dashboard.   Electronically Signed   By: Marijo Sanes M.D.   On: 02/18/2017 13:34  IMPRESSION: 1. Previously identified lesions involving the T5, T6, and T8 vertebral bodies demonstrate diffuse postcontrast enhancement. Again, findings are suspicious for possible metastases or myeloma. 2. No other focal lesions or abnormal enhancement identified within the thoracic spine.   Electronically Signed   By: Jeannine Boga M.D.   On: 02/18/2017 19:16  ASSESSMENT & PLAN:   61 year old male originally from [place] with   1) Diffusely enhancing lesions involving T5, T6 and T8 vertebral bodies with back pain. Biopsy as noted above showed only benign bone and marrow elements with mild polytypic plasmacytosis. Despite all the 3 lesions being biopsied that is no evidence of carcinoma or overt plasmacytoma.  Would need to consider other reactive processes PSA within normal limits Upper endoscopy in October 2017 and was found to have a benign esophageal stricture which was dilated. Colonoscopy was in 2016 and did not show any acute  abnormalities. Low-dose CT of the chest was done on 07/20/2016 for lung cancer screening and did not show any overt concerning lesions. PET/CT scan done shows that the lesions at T5, T6 and T8 do not have hypermetabolic activity and are thought to possibly represent degenerative changes.  SPEP, serum free light chains, LDH and beta-2 microglobulin are unrevealing with regards to a plasma cell dyscrasia. -Patient sedimentation rate and CRP are not significantly elevated to suggest a chronic osteomyelitis. -ANA profile negative -Quantiferon Gold TB assay - negative Plan -Findings and PET/CT findings were discussed in details. -PET/CT images were reviewed with the patient and his wife. -They are glad about the fact that there is no overt evidence of malignancy or other concerning lab findings at this time. -His symptoms have improved and he has no other acute new concerning focal symptoms. -We discussed that this could possibly also be related to degenerative changes and some strain. 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 -We discussed the recommendation to repeat a MRI of the T-spine in 4 months to rule out any other concerning interval changes. He will call us if he has any other new symptoms. - 2) Active smoker  -Counseled the patient extensively on absolute smoking cessation. -He was given the knot, quit line information for additional counseling. -Given prescription for nicotine patch. -Patient notes he feels ready to try to quit smoking and his wife is quite supportive of his decision.  3). Patient Active Problem List   Diagnosis Date Noted  . Solitary plasmacytoma of bone ([place]) 03/05/2017  . Lytic bone lesions on xray 02/22/2017  . Vertebral artery stenosis, asymptomatic 06/22/2016  . Amaurosis fugax 06/03/2016  . Hyperlipidemia with target LDL less than 100 06/03/2016  . Abnormal thallium stress test 05/20/2016  . Dysphagia 05/18/2016  . Neuropathy of both feet  05/28/2015  .  Eczema, dyshidrotic 05/27/2015  . Type 2 diabetes mellitus with manifestations ([place]) 03/27/2015  . Routine general medical examination at a health care facility 04/04/2013  . Vitamin D deficiency 04/04/2013  . Essential hypertension 06/19/2009  . GERD 06/19/2009  . TOBACCO USE 03/21/2009  . ELECTROCARDIOGRAM, ABNORMAL 03/21/2009  Plan -Continue follow-up with primary care physician for continued management    MRI t spine in 4 months RTC with Dr Irene Limbo in 4 months after MRI T spine  All of the patients questions were answered with apparent satisfaction. The patient knows to call the clinic with any problems, questions or concerns.  I spent 25 minutes counseling the patient face to face. The total time spent in the appointment was 30 minutes and more than 50% was on counseling and direct patient cares.    Sullivan Lone MD [place] AAHIVMS [facility] Hematology/Oncology Physician [facility]  (Office):       [phone] (Work cell):  [phone] (Fax):           [phone]  04/07/2017 9:34 AM

## 2017-05-11 ENCOUNTER — Other Ambulatory Visit: Payer: Self-pay | Admitting: Internal Medicine

## 2017-05-11 DIAGNOSIS — E118 Type 2 diabetes mellitus with unspecified complications: Secondary | ICD-10-CM

## 2017-05-11 DIAGNOSIS — I10 Essential (primary) hypertension: Secondary | ICD-10-CM

## 2017-07-26 ENCOUNTER — Other Ambulatory Visit: Payer: Self-pay | Admitting: Internal Medicine

## 2017-07-26 DIAGNOSIS — G453 Amaurosis fugax: Secondary | ICD-10-CM

## 2017-07-26 DIAGNOSIS — E785 Hyperlipidemia, unspecified: Secondary | ICD-10-CM

## 2017-07-26 DIAGNOSIS — E118 Type 2 diabetes mellitus with unspecified complications: Secondary | ICD-10-CM

## 2017-07-27 ENCOUNTER — Ambulatory Visit (INDEPENDENT_AMBULATORY_CARE_PROVIDER_SITE_OTHER): Payer: Federal, State, Local not specified - PPO | Admitting: Cardiology

## 2017-07-27 ENCOUNTER — Encounter: Payer: Self-pay | Admitting: Cardiology

## 2017-07-27 ENCOUNTER — Other Ambulatory Visit: Payer: Self-pay | Admitting: Cardiology

## 2017-07-27 VITALS — BP 128/62 | HR 90 | Ht 68.0 in | Wt 158.8 lb

## 2017-07-27 DIAGNOSIS — I2510 Atherosclerotic heart disease of native coronary artery without angina pectoris: Secondary | ICD-10-CM

## 2017-07-27 DIAGNOSIS — I10 Essential (primary) hypertension: Secondary | ICD-10-CM

## 2017-07-27 DIAGNOSIS — I6523 Occlusion and stenosis of bilateral carotid arteries: Secondary | ICD-10-CM | POA: Diagnosis not present

## 2017-07-27 DIAGNOSIS — I6529 Occlusion and stenosis of unspecified carotid artery: Secondary | ICD-10-CM | POA: Insufficient documentation

## 2017-07-27 DIAGNOSIS — E785 Hyperlipidemia, unspecified: Secondary | ICD-10-CM | POA: Diagnosis not present

## 2017-07-27 DIAGNOSIS — I25759 Atherosclerosis of native coronary artery of transplanted heart with unspecified angina pectoris: Secondary | ICD-10-CM

## 2017-07-27 HISTORY — DX: Atherosclerotic heart disease of native coronary artery without angina pectoris: I25.10

## 2017-07-27 HISTORY — DX: Occlusion and stenosis of unspecified carotid artery: I65.29

## 2017-07-27 MED ORDER — ROSUVASTATIN CALCIUM 40 MG PO TABS: 40.0000 mg | ORAL_TABLET | Freq: Every day | ORAL | 11 refills | Status: DC

## 2017-07-27 NOTE — Patient Instructions (Signed)
Medication Instructions:  1) START CRESTOR 40 mg daily  Labwork: TODAY: pre-procedure labs  FASTING LABS IN 6 WEEKS:  Testing/Procedures: Your physician has requested that you have a cardiac catheterization. Cardiac catheterization is used to diagnose and/or treat various heart conditions. Doctors may recommend this procedure for a number of different reasons. The most common reason is to evaluate chest pain. Chest pain can be a symptom of coronary artery disease (CAD), and cardiac catheterization can show whether plaque is narrowing or blocking your heart's arteries. This procedure is also used to evaluate the valves, as well as measure the blood flow and oxygen levels in different parts of your heart. For further information please visit [email or web address]. Please follow instruction sheet, as given.  Follow-Up: Your provider wants you to follow-up in: 6 months with Dr. Radford Pax. You will receive a reminder letter in the mail two months in advance. If you don't receive a letter, please call our office to schedule the follow-up appointment.    Any Other Special Instructions Will Be Listed Below (If Applicable).   [facility] OFFICE [address], [place] [address] Dept: [place]: [place]  07/27/2017  You are scheduled for a Cardiac Catheterization on Wednesday, September 5 with Dr. Sherren Mocha.  1. Please arrive at the [facility] (Main Entrance A) at [facility]: [address] [place], [place] [address] at 11:00 AM (two hours before your procedure to ensure your preparation). Free valet parking service is available.   Special note: Every effort is made to have your procedure done on time. Please understand that emergencies sometimes delay scheduled procedures.  2. Diet: Do not eat or drink anything after midnight prior to your procedure except sips of water  to take medications.  3. Labs: None needed.  4. Medication instructions in preparation for your procedure: On the morning of your procedure, take your Aspirin and your other medications as directed.  You may use sips of water.  5. Plan for one night stay--bring personal belongings. 6. Bring a current list of your medications and current insurance cards. 7. You MUST have a responsible person to drive you home. 8. Someone MUST be with you the first 24 hours after you arrive home or your discharge will be delayed. 9. Please wear clothes that are easy to get on and off and wear slip-on shoes.  Thank you for allowing [place] to care for you!   -- [facility]     If you need a refill on your cardiac medications before your next appointment, please call your pharmacy.

## 2017-07-27 NOTE — Progress Notes (Signed)
Cardiology Office Note:    Date:  07/27/2017   ID:  Donald Jennings, DOB October 25, 1956, MRN [id number]  PCP:  Janith Lima, MD  Cardiologist:  Fransico Him, MD   Referring MD: Janith Lima, MD   Chief Complaint  Patient presents with  . Coronary Artery Disease  . Hypertension  . Hyperlipidemia    History of Present Illness:    Donald Jennings is a 61 y.o. male with a hx of with a history of hyperlipidemia, DM, HTN, TIA in his eye who was initially referred to me for evaluation of TIA with echo and carotid dopplers. Echo showed normal LVF with G1DD and carotid dopplers showed showed 1-39% stenosis bilaterally.  He had also been complaining of worsening SOB and DOE for the past year but only when walking up an incline in his back yard.  He also had some atypical chest pain and had a stress test done that showed EF 51% with T wave inversions and 29mm of lateral ST depression during stress but no ischemia on nuclear images.  He underwent coronary CTA which showed a calcium score of 43 with mild calcium in the LAD < 40% in the prox and 50% in the mid LAD and 40% plaque in the distal RCA .  He is also a smoker.   He is here today for followup and is doing well.  He denies any chest pain or pressure, SOB, DOE, PND, orthopnea, LE edema, dizziness, palpitations, syncope. He had been on Crestor 40mg  daily and stopped it due to back pain and was found to have a spot on his spine.  He was subsequently dx with   Past Medical History:  Diagnosis Date  . CAD (coronary artery disease)   . CAD (coronary artery disease), native coronary artery 07/27/2017   < 40% prox LAD, 50% mid LAD and 40% distal RCA  . Carotid artery stenosis 07/27/2017   1-39% bilateral  . CERVICAL RADICULOPATHY 07/02/2010   Qualifier: Diagnosis of  By: Ronnald Ramp MD, Arvid Right.   . DEPRESSION 03/21/2009   Qualifier: Diagnosis of  By: Ronnald Ramp MD, Arvid Right.   . Diabetes mellitus without complication ([place])   . GERD 06/19/2009   Qualifier: Diagnosis of   By: Ronnald Ramp MD, Arvid Right.   [name] HLD (hyperlipidemia)   . Hx of cardiovascular stress test    ETT-Myoview (03/2014):  No ischemia, EF 56%; Low Risk  . HYPERTENSION 06/19/2009   Qualifier: Diagnosis of  By: Ronnald Ramp MD, [place], RIGHT 07/02/2010   Qualifier: Diagnosis of  By: Ronnald Ramp MD, Arvid Right.   . SOB (shortness of breath) 06/10/2016  . TOBACCO USE 03/21/2009   Qualifier: Diagnosis of  By: Ronnald Ramp MD, Arvid Right.     Past Surgical History:  Procedure Laterality Date  . CATARACT EXTRACTION Bilateral    Left 2009 at [facility], Right 2004 in [place]  . COLONOSCOPY  07/25/2015   Dr Carol Ada  . INGUINAL HERNIA REPAIR Right 1986  . IR FLUORO GUIDED NEEDLE PLC ASPIRATION/INJECTION LOC  03/02/2017  . IR FLUORO GUIDED NEEDLE PLC ASPIRATION/INJECTION LOC  03/02/2017  . IR FLUORO GUIDED NEEDLE PLC ASPIRATION/INJECTION LOC  03/02/2017  . IR RADIOLOGIST EVAL & MGMT  03/01/2017    Current Medications: Current Meds  Medication Sig  . aspirin EC 81 MG tablet Take 1 tablet (81 mg total) by mouth daily.  [name] losartan (COZAAR) 100 MG tablet TAKE 1 TABLET (100 MG TOTAL) BY  MOUTH DAILY.  . metoprolol succinate (TOPROL-XL) 25 MG 24 hr tablet TAKE 1 TABLET (25 MG TOTAL) BY MOUTH DAILY.  . Multiple Vitamins-Minerals (MULTIVITAMIN ADULT) TABS Take 1 tablet by mouth daily.  [name] omeprazole (PRILOSEC OTC) 20 MG tablet Take 20 mg by mouth daily.  . Vitamin D, Ergocalciferol, (DRISDOL) 50000 units CAPS capsule TAKE 1 CAPSULE (50,000 UNITS TOTAL) BY MOUTH ONCE A WEEK.     Allergies:   Patient has no known allergies.   Social History   Social History  . Marital status: Married    Spouse name: N/A  . Number of children: 1  . Years of education: N/A   Occupational History  . Scientist, clinical (histocompatibility and immunogenetics)    Social History Main Topics  . Smoking status: Current Every Day Smoker    Packs/day: 1.00    Years: 38.50    Types: Cigarettes  . Smokeless tobacco: Never Used     Comment: Currently  smokes 1/2 ppd and  is considering Chantix  . Alcohol use No  . Drug use: No  . Sexual activity: Not Asked   Other Topics Concern  . 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 There is no history of Cancer, Early death, Hypertension, Hyperlipidemia, or Kidney disease.  ROS:   Please see the history of present illness.     All other systems reviewed and are negative.  EKGs/Labs/Other Studies Reviewed:    The following studies were reviewed today: none  EKG:  EKG is not ordered today.   Recent Labs: 03/24/2017: ALT 19; BUN 21.8; Creatinine 1.0; HGB 13.9; Platelets 234; Potassium 4.5; Sodium 140   Recent Lipid Panel    Component Value Date/Time   CHOL 93 (L) 08/14/2016 1155   TRIG 126 08/14/2016 1155   HDL 32 (L) 08/14/2016 1155   CHOLHDL 2.9 08/14/2016 1155   VLDL 25 08/14/2016 1155   LDLCALC 36 08/14/2016 1155   LDLDIRECT 167.4 03/21/2009 1030    Physical Exam:    VS:  BP 128/62   Pulse 90   Ht 5\' 8"  (1.727 m)   Wt 158 lb 12.8 oz (72 kg)   SpO2 95%   BMI 24.15 kg/m     Wt Readings from Last 3 Encounters:  07/27/17 158 lb 12.8 oz (72 kg)  04/07/17 158 lb 4.8 oz (71.8 kg)  03/24/17 159 lb (72.1 kg)     GEN:  Well nourished, well developed in no acute distress HEENT: Normal NECK: No JVD; No carotid bruits LYMPHATICS: No lymphadenopathy CARDIAC: RRR, no murmurs, rubs, gallops RESPIRATORY:  Clear to auscultation without rales, wheezing or rhonchi  ABDOMEN: Soft, non-tender, non-distended MUSCULOSKELETAL:  No edema; No deformity  SKIN: Warm and dry NEUROLOGIC:  Alert and oriented x 3 PSYCHIATRIC:  Normal affect   ASSESSMENT:    1. Coronary artery disease involving native coronary artery of native heart without angina  pectoris   2. Bilateral carotid artery stenosis   3. Essential hypertension   4. Hyperlipidemia LDL goal <70    PLAN:    In order of problems listed above:  1.  ASCAD nonobstructive by coronary CTA - he has been having episodes of exertional angina when washing windows or working outside  associated with SOB and diaphoresis and nausea.  I am concerned that he may have had progression of CAD as his LDL had been 176 when off of statin in the past.  I will set him up for left heart cath to rule out progression of CAD.   Cardiac catheterization was discussed with the patient fully. The patient understands that risks include but are not limited to stroke (1 in 1000), death (1 in 78), kidney failure [usually temporary] (1 in 500), bleeding (1 in 200), allergic reaction [possibly serious] (1 in 200).  The patient understands and is willing to proceed.   He will continue on ASA 81mg  daily, Toprol XL 25mg  daily and restart Crestor.   2.  HTN - Bp is well controlled on exam today. He will continue on Losartan 100mg  daily and Toprol XL 25mg  daily.    3.  Bilateral carotid artery stenosis 1-39% - follow in 2 weeks.  He will continue on ASA.   4.  Hyperlipidemia - LDL goal < 70 due to carotid disease.  His LDL went from 173 to 36 on Crestor 40mg  daily.  He stopped it because he did not like what he read about the drug.  I will restart the Crestor at 40mg  daily and repeat FLP and ALT in 6 weeks.   5.  Amaurosis fugax with MRI of head showing critical stenosis vs segmental occlusion of the left vertebral artery at the foramen magnum.  No evidence of infarct.     Medication Adjustments/Labs and Tests Ordered: Current medicines are reviewed at length with the patient today.  Concerns regarding medicines are outlined above.  No orders of the defined types were placed in this encounter.  No orders of the defined types were placed in this encounter.   Signed, Fransico Him, MD  07/27/2017 3:52 PM    [facility]

## 2017-07-28 LAB — BASIC METABOLIC PANEL
BUN/Creatinine Ratio: 19 (ref 10–24)
BUN: 17 mg/dL (ref 8–27)
CO2: 23 mmol/L (ref 20–29)
Calcium: 9.3 mg/dL (ref 8.6–10.2)
Chloride: 105 mmol/L (ref 96–106)
Creatinine, Ser: 0.9 mg/dL (ref 0.76–1.27)
GFR calc Af Amer: 107 mL/min/{1.73_m2} (ref >59)
GFR calc non Af Amer: 93 mL/min/{1.73_m2} (ref >59)
GLUCOSE: 88 mg/dL (ref 65–99)
POTASSIUM: 4.4 mmol/L (ref 3.5–5.2)
SODIUM: 143 mmol/L (ref 134–144)

## 2017-07-28 LAB — APTT: aPTT: 32 s (ref 24–33)

## 2017-07-28 LAB — CBC WITH DIFFERENTIAL/PLATELET
BASOS: 0 %
Basophils Absolute: 0 10*3/uL (ref 0.0–0.2)
EOS (ABSOLUTE): 0.1 10*3/uL (ref 0.0–0.4)
Eos: 1 %
Hematocrit: 41.8 % (ref 37.5–51.0)
Hemoglobin: 13.6 g/dL (ref 13.0–17.7)
IMMATURE GRANULOCYTES: 0 %
Immature Grans (Abs): 0 10*3/uL (ref 0.0–0.1)
Lymphocytes Absolute: 2.7 10*3/uL (ref 0.7–3.1)
Lymphs: 31 %
MCH: 28 pg (ref 26.6–33.0)
MCHC: 32.5 g/dL (ref 31.5–35.7)
MCV: 86 fL (ref 79–97)
MONOS ABS: 0.6 10*3/uL (ref 0.1–0.9)
Monocytes: 7 %
NEUTROS PCT: 61 %
Neutrophils Absolute: 5.4 10*3/uL (ref 1.4–7.0)
PLATELETS: 239 10*3/uL (ref 150–379)
RBC: 4.85 x10E6/uL (ref 4.14–5.80)
RDW: 14.3 % (ref 12.3–15.4)
WBC: 8.8 10*3/uL (ref 3.4–10.8)

## 2017-07-28 LAB — PROTIME-INR
INR: 1 (ref 0.8–1.2)
Prothrombin Time: 10.4 s (ref 9.1–12.0)

## 2017-08-03 ENCOUNTER — Telehealth: Payer: Self-pay

## 2017-08-03 NOTE — Telephone Encounter (Signed)
Patient contacted pre-catheterization at [facility] scheduled for:   08/04/2017 @ 1300 Verified arrival time and place:  NT @ 1100-gave directions to Short Stay Confirmed AM meds to be taken pre-cath with sip of water: Take ASA Confirmed patient has responsible person to drive home post procedure and observe patient for 24 hours:  yes Addl concerns:  None noted

## 2017-08-04 ENCOUNTER — Encounter (HOSPITAL_COMMUNITY): Payer: Self-pay | Admitting: Cardiovascular Disease

## 2017-08-04 ENCOUNTER — Encounter (HOSPITAL_COMMUNITY)
Admission: RE | Disposition: A | Discharge status: Home / Self Care | Payer: Self-pay | Source: Ambulatory Visit | Attending: Cardiovascular Disease

## 2017-08-04 ENCOUNTER — Ambulatory Visit (HOSPITAL_COMMUNITY)
Admission: RE | Admit: 2017-08-04 | Discharge: 2017-08-05 | Disposition: A | Discharge status: Home / Self Care | Payer: Federal, State, Local not specified - PPO | Source: Ambulatory Visit | Attending: Cardiovascular Disease | Admitting: Cardiovascular Disease

## 2017-08-04 DIAGNOSIS — E785 Hyperlipidemia, unspecified: Secondary | ICD-10-CM | POA: Insufficient documentation

## 2017-08-04 DIAGNOSIS — I2582 Chronic total occlusion of coronary artery: Secondary | ICD-10-CM | POA: Diagnosis not present

## 2017-08-04 DIAGNOSIS — Z955 Presence of coronary angioplasty implant and graft: Secondary | ICD-10-CM

## 2017-08-04 DIAGNOSIS — I6523 Occlusion and stenosis of bilateral carotid arteries: Secondary | ICD-10-CM | POA: Diagnosis not present

## 2017-08-04 DIAGNOSIS — Z7902 Long term (current) use of antithrombotics/antiplatelets: Secondary | ICD-10-CM | POA: Diagnosis not present

## 2017-08-04 DIAGNOSIS — Z8673 Personal history of transient ischemic attack (TIA), and cerebral infarction without residual deficits: Secondary | ICD-10-CM | POA: Insufficient documentation

## 2017-08-04 DIAGNOSIS — F17210 Nicotine dependence, cigarettes, uncomplicated: Secondary | ICD-10-CM | POA: Insufficient documentation

## 2017-08-04 DIAGNOSIS — K219 Gastro-esophageal reflux disease without esophagitis: Secondary | ICD-10-CM | POA: Diagnosis not present

## 2017-08-04 DIAGNOSIS — I25118 Atherosclerotic heart disease of native coronary artery with other forms of angina pectoris: Secondary | ICD-10-CM

## 2017-08-04 DIAGNOSIS — Z7982 Long term (current) use of aspirin: Secondary | ICD-10-CM | POA: Diagnosis not present

## 2017-08-04 DIAGNOSIS — E119 Type 2 diabetes mellitus without complications: Secondary | ICD-10-CM | POA: Insufficient documentation

## 2017-08-04 DIAGNOSIS — I25759 Atherosclerosis of native coronary artery of transplanted heart with unspecified angina pectoris: Secondary | ICD-10-CM

## 2017-08-04 DIAGNOSIS — F329 Major depressive disorder, single episode, unspecified: Secondary | ICD-10-CM | POA: Diagnosis not present

## 2017-08-04 HISTORY — PX: CORONARY STENT INTERVENTION: CATH118234

## 2017-08-04 HISTORY — PX: LEFT HEART CATH AND CORONARY ANGIOGRAPHY: CATH118249

## 2017-08-04 HISTORY — DX: Atherosclerotic heart disease of native coronary artery with other forms of angina pectoris: I25.118

## 2017-08-04 LAB — POCT ACTIVATED CLOTTING TIME
ACTIVATED CLOTTING TIME: 257 s
Activated Clotting Time: 219 seconds
Activated Clotting Time: 329 seconds

## 2017-08-04 SURGERY — LEFT HEART CATH AND CORONARY ANGIOGRAPHY
Anesthesia: LOCAL

## 2017-08-04 MED ORDER — OXYCODONE HCL 5 MG PO TABS
5.0000 mg | ORAL_TABLET | ORAL | Status: DC | PRN
  Administered 2017-08-04: 5 mg via ORAL
  Filled 2017-08-04: qty 1

## 2017-08-04 MED ORDER — SODIUM CHLORIDE 0.9% FLUSH
3.0000 mL | Freq: Two times a day (BID) | INTRAVENOUS | Status: DC
  Administered 2017-08-04 – 2017-08-05 (×2): 3 mL via INTRAVENOUS

## 2017-08-04 MED ORDER — MIDAZOLAM HCL 2 MG/2ML IJ SOLN
INTRAMUSCULAR | Status: AC
  Filled 2017-08-04: qty 2

## 2017-08-04 MED ORDER — MIDAZOLAM HCL 2 MG/2ML IJ SOLN
INTRAMUSCULAR | Status: DC | PRN
  Administered 2017-08-04: 2 mg via INTRAVENOUS
  Administered 2017-08-04: 1 mg via INTRAVENOUS

## 2017-08-04 MED ORDER — HYDRALAZINE HCL 20 MG/ML IJ SOLN: 5.0000 mg | INTRAMUSCULAR | Status: AC | PRN

## 2017-08-04 MED ORDER — LABETALOL HCL 5 MG/ML IV SOLN: 10.0000 mg | INTRAVENOUS | Status: AC | PRN

## 2017-08-04 MED ORDER — METOPROLOL SUCCINATE ER 25 MG PO TB24
25.0000 mg | ORAL_TABLET | Freq: Every day | ORAL | Status: DC
  Administered 2017-08-04: 17:00:00 25 mg via ORAL
  Filled 2017-08-04: qty 1

## 2017-08-04 MED ORDER — IOPAMIDOL (ISOVUE-370) INJECTION 76%
INTRAVENOUS | Status: DC | PRN
Start: 2017-08-04 — End: 2017-08-04
  Administered 2017-08-04: 120 mL via INTRA_ARTERIAL

## 2017-08-04 MED ORDER — SODIUM CHLORIDE 0.9% FLUSH: 3.0000 mL | Freq: Two times a day (BID) | INTRAVENOUS | Status: DC

## 2017-08-04 MED ORDER — CLOPIDOGREL BISULFATE 300 MG PO TABS
ORAL_TABLET | ORAL | Status: AC
  Filled 2017-08-04: qty 1

## 2017-08-04 MED ORDER — FENTANYL CITRATE (PF) 100 MCG/2ML IJ SOLN
INTRAMUSCULAR | Status: AC
  Filled 2017-08-04: qty 2

## 2017-08-04 MED ORDER — VERAPAMIL HCL 2.5 MG/ML IV SOLN
INTRAVENOUS | Status: DC | PRN
  Administered 2017-08-04: 13:00:00 via INTRA_ARTERIAL

## 2017-08-04 MED ORDER — HEART ATTACK BOUNCING BOOK
Freq: Once | Status: AC
  Administered 2017-08-05: 06:00:00
  Filled 2017-08-04: qty 1

## 2017-08-04 MED ORDER — FENTANYL CITRATE (PF) 100 MCG/2ML IJ SOLN
INTRAMUSCULAR | Status: DC | PRN
  Administered 2017-08-04: 25 ug via INTRAVENOUS

## 2017-08-04 MED ORDER — SODIUM CHLORIDE 0.9 % IV SOLN: 250.0000 mL | INTRAVENOUS | Status: DC | PRN

## 2017-08-04 MED ORDER — SODIUM CHLORIDE 0.9 % WEIGHT BASED INFUSION: 1.0000 mL/kg/h | INTRAVENOUS | Status: AC

## 2017-08-04 MED ORDER — HEPARIN (PORCINE) IN NACL 2-0.9 UNIT/ML-% IJ SOLN
INTRAMUSCULAR | Status: AC | PRN
  Administered 2017-08-04: 1000 mL via INTRA_ARTERIAL

## 2017-08-04 MED ORDER — SODIUM CHLORIDE 0.9% FLUSH: 3.0000 mL | INTRAVENOUS | Status: DC | PRN

## 2017-08-04 MED ORDER — ACETAMINOPHEN 325 MG PO TABS: 650.0000 mg | ORAL_TABLET | ORAL | Status: DC | PRN

## 2017-08-04 MED ORDER — ANGIOPLASTY BOOK
Freq: Once | Status: AC
  Administered 2017-08-05: 06:00:00
  Filled 2017-08-04: qty 1

## 2017-08-04 MED ORDER — NITROGLYCERIN 1 MG/10 ML FOR IR/CATH LAB
INTRA_ARTERIAL | Status: DC | PRN
  Administered 2017-08-04 (×5): 200 ug via INTRACORONARY

## 2017-08-04 MED ORDER — LIDOCAINE HCL (PF) 1 % IJ SOLN
INTRAMUSCULAR | Status: AC
  Filled 2017-08-04: qty 30

## 2017-08-04 MED ORDER — CLOPIDOGREL BISULFATE 300 MG PO TABS
ORAL_TABLET | ORAL | Status: DC | PRN
  Administered 2017-08-04: 600 mg via ORAL

## 2017-08-04 MED ORDER — ASPIRIN 81 MG PO CHEW: 81.0000 mg | CHEWABLE_TABLET | ORAL | Status: DC

## 2017-08-04 MED ORDER — ROSUVASTATIN CALCIUM 20 MG PO TABS
40.0000 mg | ORAL_TABLET | Freq: Every day | ORAL | Status: DC
  Administered 2017-08-04 – 2017-08-05 (×2): 40 mg via ORAL
  Filled 2017-08-04 (×2): qty 2

## 2017-08-04 MED ORDER — FAMOTIDINE IN NACL 20-0.9 MG/50ML-% IV SOLN
INTRAVENOUS | Status: AC | PRN
  Administered 2017-08-04: 20 mg via INTRAVENOUS

## 2017-08-04 MED ORDER — HEPARIN SODIUM (PORCINE) 1000 UNIT/ML IJ SOLN
INTRAMUSCULAR | Status: AC
  Filled 2017-08-04: qty 1

## 2017-08-04 MED ORDER — MORPHINE SULFATE (PF) 4 MG/ML IV SOLN: 2.0000 mg | INTRAVENOUS | Status: DC | PRN

## 2017-08-04 MED ORDER — ONDANSETRON HCL 4 MG/2ML IJ SOLN: 4.0000 mg | Freq: Four times a day (QID) | INTRAMUSCULAR | Status: DC | PRN

## 2017-08-04 MED ORDER — HEPARIN SODIUM (PORCINE) 1000 UNIT/ML IJ SOLN
INTRAMUSCULAR | Status: DC | PRN
Start: 2017-08-04 — End: 2017-08-04
  Administered 2017-08-04: 2000 [IU] via INTRAVENOUS
  Administered 2017-08-04: 6000 [IU] via INTRAVENOUS
  Administered 2017-08-04: 5000 [IU] via INTRAVENOUS

## 2017-08-04 MED ORDER — LOSARTAN POTASSIUM 50 MG PO TABS
100.0000 mg | ORAL_TABLET | Freq: Every day | ORAL | Status: DC
  Administered 2017-08-04 – 2017-08-05 (×2): 100 mg via ORAL
  Filled 2017-08-04 (×2): qty 2

## 2017-08-04 MED ORDER — NITROGLYCERIN 1 MG/10 ML FOR IR/CATH LAB
INTRA_ARTERIAL | Status: AC
Start: 2017-08-04 — End: ?
  Filled 2017-08-04: qty 10

## 2017-08-04 MED ORDER — NITROGLYCERIN 1 MG/10 ML FOR IR/CATH LAB
INTRA_ARTERIAL | Status: AC
  Filled 2017-08-04: qty 10

## 2017-08-04 MED ORDER — VERAPAMIL HCL 2.5 MG/ML IV SOLN
INTRAVENOUS | Status: AC
  Filled 2017-08-04: qty 2

## 2017-08-04 MED ORDER — LIDOCAINE HCL (PF) 1 % IJ SOLN
INTRAMUSCULAR | Status: DC | PRN
Start: 2017-08-04 — End: 2017-08-04
  Administered 2017-08-04: 2 mL

## 2017-08-04 MED ORDER — ASPIRIN EC 81 MG PO TBEC
81.0000 mg | DELAYED_RELEASE_TABLET | Freq: Every day | ORAL | Status: DC
  Administered 2017-08-05: 81 mg via ORAL
  Filled 2017-08-04: qty 1

## 2017-08-04 MED ORDER — IOPAMIDOL (ISOVUE-370) INJECTION 76%
INTRAVENOUS | Status: AC
  Filled 2017-08-04: qty 100

## 2017-08-04 MED ORDER — SODIUM CHLORIDE 0.9 % WEIGHT BASED INFUSION
3.0000 mL/kg/h | INTRAVENOUS | Status: DC
  Administered 2017-08-04: 3 mL/kg/h via INTRAVENOUS

## 2017-08-04 MED ORDER — CLOPIDOGREL BISULFATE 75 MG PO TABS
75.0000 mg | ORAL_TABLET | Freq: Every day | ORAL | Status: DC
  Administered 2017-08-05: 75 mg via ORAL
  Filled 2017-08-04: qty 1

## 2017-08-04 MED ORDER — SODIUM CHLORIDE 0.9 % WEIGHT BASED INFUSION: 1.0000 mL/kg/h | INTRAVENOUS | Status: DC

## 2017-08-04 SURGICAL SUPPLY — 23 items
BALLN EMERGE MR 2.0X12 (BALLOONS) ×2
BALLN EMERGE MR 2.0X20 (BALLOONS) ×2
BALLN ~~LOC~~ EMERGE MR 2.25X15 (BALLOONS) ×2
BALLOON EMERGE MR 2.0X12 (BALLOONS) ×1 IMPLANT
BALLOON EMERGE MR 2.0X20 (BALLOONS) ×1 IMPLANT
BALLOON ~~LOC~~ EMERGE MR 2.25X15 (BALLOONS) ×1 IMPLANT
CATH 5FR JL3.5 JR4 ANG PIG MP (CATHETERS) ×2 IMPLANT
CATH LAUNCHER 6FR EBU3.5 (CATHETERS) ×2 IMPLANT
CATH LAUNCHER 6FR JR4 (CATHETERS) ×2 IMPLANT
DEVICE RAD COMP TR BAND LRG (VASCULAR PRODUCTS) ×2 IMPLANT
GLIDESHEATH SLEND SS 6F .021 (SHEATH) ×2 IMPLANT
GUIDEWIRE INQWIRE 1.5J.035X260 (WIRE) ×1 IMPLANT
INQWIRE 1.5J .035X260CM (WIRE) ×2
KIT ENCORE 26 ADVANTAGE (KITS) ×2 IMPLANT
KIT HEART LEFT (KITS) ×2 IMPLANT
PACK CARDIAC CATHETERIZATION (CUSTOM PROCEDURE TRAY) ×2 IMPLANT
STENT RESOLUTE ONYX 2.0X30 (Permanent Stent) ×2 IMPLANT
STENT RESOLUTE ONYX 3.5X12 (Permanent Stent) ×2 IMPLANT
SYR MEDRAD MARK V 150ML (SYRINGE) ×2 IMPLANT
TRANSDUCER W/STOPCOCK (MISCELLANEOUS) ×2 IMPLANT
TUBING CIL FLEX 10 FLL-RA (TUBING) ×2 IMPLANT
WIRE COUGAR XT STRL 190CM (WIRE) ×2 IMPLANT
WIRE HI TORQ WHISPER MS 190CM (WIRE) ×2 IMPLANT

## 2017-08-04 NOTE — Interval H&P Note (Signed)
Cath Lab Visit (complete for each Cath Lab visit)  Clinical Evaluation Leading to the Procedure:   ACS: No.  Non-ACS:    Anginal Classification: CCS III  Anti-ischemic medical therapy: Minimal Therapy (1 class of medications)  Non-Invasive Test Results: No non-invasive testing performed  Prior CABG: No previous CABG      History and Physical Interval Note:  08/04/2017 11:59 AM  Donald Jennings  has presented today for surgery, with the diagnosis of CAD/Chest Pain  The various methods of treatment have been discussed with the patient and family. After consideration of risks, benefits and other options for treatment, the patient has consented to  Procedure(s): LEFT HEART CATH AND CORONARY ANGIOGRAPHY (N/A) as a surgical intervention .  [name] history has been reviewed, patient examined, no change in status, stable for surgery.  I have reviewed the patient's chart and labs.  Questions were answered to the patient's satisfaction.     Sherren Mocha

## 2017-08-04 NOTE — H&P (View-Only) (Signed)
Cardiology Office Note:    Date:  07/27/2017   ID:  Donald Jennings, DOB 1956/08/25, MRN [id number]  PCP:  Janith Lima, MD  Cardiologist:  Fransico Him, MD   Referring MD: Janith Lima, MD   Chief Complaint  Patient presents with  . Coronary Artery Disease  . Hypertension  . Hyperlipidemia    History of Present Illness:    Donald Jennings is a 61 y.o. male with a hx of with a history of hyperlipidemia, DM, HTN, TIA in his eye who was initially referred to me for evaluation of TIA with echo and carotid dopplers. Echo showed normal LVF with G1DD and carotid dopplers showed showed 1-39% stenosis bilaterally.  He had also been complaining of worsening SOB and DOE for the past year but only when walking up an incline in his back yard.  He also had some atypical chest pain and had a stress test done that showed EF 51% with T wave inversions and 62mm of lateral ST depression during stress but no ischemia on nuclear images.  He underwent coronary CTA which showed a calcium score of 43 with mild calcium in the LAD < 40% in the prox and 50% in the mid LAD and 40% plaque in the distal RCA .  He is also a smoker.   He is here today for followup and is doing well.  He denies any chest pain or pressure, SOB, DOE, PND, orthopnea, LE edema, dizziness, palpitations, syncope. He had been on Crestor 40mg  daily and stopped it due to back pain and was found to have a spot on his spine.  He was subsequently dx with   Past Medical History:  Diagnosis Date  . CAD (coronary artery disease)   . CAD (coronary artery disease), native coronary artery 07/27/2017   < 40% prox LAD, 50% mid LAD and 40% distal RCA  . Carotid artery stenosis 07/27/2017   1-39% bilateral  . CERVICAL RADICULOPATHY 07/02/2010   Qualifier: Diagnosis of  By: Donald Ramp MD, Donald Jennings.   . DEPRESSION 03/21/2009   Qualifier: Diagnosis of  By: Donald Ramp MD, Donald Jennings.   . Diabetes mellitus without complication (Donald Jennings)   . GERD 06/19/2009   Qualifier: Diagnosis of   By: Donald Ramp MD, Donald Jennings.   Donald Jennings HLD (hyperlipidemia)   . Hx of cardiovascular stress test    ETT-Myoview (03/2014):  No ischemia, EF 56%; Low Risk  . HYPERTENSION 06/19/2009   Qualifier: Diagnosis of  By: Donald Ramp MD, [place], [place] 07/02/2010   Qualifier: Diagnosis of  By: Donald Ramp MD, Donald Jennings.   . SOB (shortness of breath) 06/10/2016  . TOBACCO USE 03/21/2009   Qualifier: Diagnosis of  By: Donald Ramp MD, Donald Jennings.     Past Surgical History:  Procedure Laterality Date  . CATARACT EXTRACTION Bilateral    Left 2009 at [facility], [place] 2004 in [place]  . COLONOSCOPY  07/25/2015   Dr Carol Ada  . INGUINAL HERNIA REPAIR Jennings 1986  . IR FLUORO GUIDED NEEDLE PLC ASPIRATION/INJECTION LOC  03/02/2017  . IR FLUORO GUIDED NEEDLE PLC ASPIRATION/INJECTION LOC  03/02/2017  . IR FLUORO GUIDED NEEDLE PLC ASPIRATION/INJECTION LOC  03/02/2017  . IR RADIOLOGIST EVAL & MGMT  03/01/2017    Current Medications: Current Meds  Medication Sig  . aspirin EC 81 MG tablet Take 1 tablet (81 mg total) by mouth daily.  Donald Jennings losartan (COZAAR) 100 MG tablet TAKE 1 TABLET (100 MG TOTAL) BY  MOUTH DAILY.  . metoprolol succinate (TOPROL-XL) 25 MG 24 hr tablet TAKE 1 TABLET (25 MG TOTAL) BY MOUTH DAILY.  . Multiple Vitamins-Minerals (MULTIVITAMIN ADULT) TABS Take 1 tablet by mouth daily.  Donald Jennings omeprazole (PRILOSEC OTC) 20 MG tablet Take 20 mg by mouth daily.  . Vitamin D, Ergocalciferol, (DRISDOL) 50000 units CAPS capsule TAKE 1 CAPSULE (50,000 UNITS TOTAL) BY MOUTH ONCE A WEEK.     Allergies:   Patient has no known allergies.   Social History   Social History  . Marital status: Married    Spouse name: N/A  . Number of children: 1  . Years of education: N/A   Occupational History  . Scientist, clinical (histocompatibility and immunogenetics)    Social History Main Topics  . Smoking status: Current Every Day Smoker    Packs/day: 1.00    Years: 38.50    Types: Cigarettes  . Smokeless tobacco: Never Used     Comment: Currently  smokes 1/2 ppd and  is considering Chantix  . Alcohol use No  . Drug use: No  . Sexual activity: Not Asked   Other Topics Concern  . 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 There is no history of Cancer, Early death, Hypertension, Hyperlipidemia, or Kidney disease.  ROS:   Please see the history of present illness.     All other systems reviewed and are negative.  EKGs/Labs/Other Studies Reviewed:    The following studies were reviewed today: none  EKG:  EKG is not ordered today.   Recent Labs: 03/24/2017: ALT 19; BUN 21.8; Creatinine 1.0; HGB 13.9; Platelets 234; Potassium 4.5; Sodium 140   Recent Lipid Panel    Component Value Date/Time   CHOL 93 (L) 08/14/2016 1155   TRIG 126 08/14/2016 1155   HDL 32 (L) 08/14/2016 1155   CHOLHDL 2.9 08/14/2016 1155   VLDL 25 08/14/2016 1155   LDLCALC 36 08/14/2016 1155   LDLDIRECT 167.4 03/21/2009 1030    Physical Exam:    VS:  BP 128/62   Pulse 90   Ht 5\' 8"  (1.727 m)   Wt 158 lb 12.8 oz (72 kg)   SpO2 95%   BMI 24.15 kg/m     Wt Readings from Last 3 Encounters:  07/27/17 158 lb 12.8 oz (72 kg)  04/07/17 158 lb 4.8 oz (71.8 kg)  03/24/17 159 lb (72.1 kg)     GEN:  Well nourished, well developed in no acute distress HEENT: Normal NECK: No JVD; No carotid bruits LYMPHATICS: No lymphadenopathy CARDIAC: RRR, no murmurs, rubs, gallops RESPIRATORY:  Clear to auscultation without rales, wheezing or rhonchi  ABDOMEN: Soft, non-tender, non-distended MUSCULOSKELETAL:  No edema; No deformity  SKIN: Warm and dry NEUROLOGIC:  Alert and oriented x 3 PSYCHIATRIC:  Normal affect   ASSESSMENT:    1. Coronary artery disease involving native coronary artery of native heart without angina  pectoris   2. Bilateral carotid artery stenosis   3. Essential hypertension   4. Hyperlipidemia LDL goal <70    PLAN:    In order of problems listed above:  1.  ASCAD nonobstructive by coronary CTA - he has been having episodes of exertional angina when washing windows or working outside  associated with SOB and diaphoresis and nausea.  I am concerned that he may have had progression of CAD as his LDL had been 176 when off of statin in the past.  I will set him up for left heart cath to rule out progression of CAD.   Cardiac catheterization was discussed with the patient fully. The patient understands that risks include but are not limited to stroke (1 in 1000), death (1 in 61), kidney failure [usually temporary] (1 in 500), bleeding (1 in 200), allergic reaction [possibly serious] (1 in 200).  The patient understands and is willing to proceed.   He will continue on ASA 81mg  daily, Toprol XL 25mg  daily and restart Crestor.   2.  HTN - Bp is well controlled on exam today. He will continue on Losartan 100mg  daily and Toprol XL 25mg  daily.    3.  Bilateral carotid artery stenosis 1-39% - follow in 2 weeks.  He will continue on ASA.   4.  Hyperlipidemia - LDL goal < 70 due to carotid disease.  His LDL went from 173 to 36 on Crestor 40mg  daily.  He stopped it because he did not like what he read about the drug.  I will restart the Crestor at 40mg  daily and repeat FLP and ALT in 6 weeks.   5.  Amaurosis fugax with MRI of head showing critical stenosis vs segmental occlusion of the left vertebral artery at the foramen magnum.  No evidence of infarct.     Medication Adjustments/Labs and Tests Ordered: Current medicines are reviewed at length with the patient today.  Concerns regarding medicines are outlined above.  No orders of the defined types were placed in this encounter.  No orders of the defined types were placed in this encounter.   Signed, Fransico Him, MD  07/27/2017 3:52 PM    [facility]

## 2017-08-04 NOTE — Care Management Note (Signed)
Case Management Note  Patient Details  Name: Donald Jennings MRN: [id number] Date of Birth: 1956/07/15  Subjective/Objective:  From home, s/p coronary stent intervention, will be on plavix.                   Action/Plan: NCM will follow for dc needs.   Expected Discharge Date:                  Expected Discharge Plan:  Home/Self Care  In-House Referral:     Discharge planning Services  CM Consult  Post Acute Care Choice:    Choice offered to:     DME Arranged:    DME Agency:     HH Arranged:    HH Agency:     Status of Service:  In process, will continue to follow  If discussed at Long Length of Stay Meetings, dates discussed:    Additional Comments:  Zenon Mayo, RN 08/04/2017, 4:08 PM

## 2017-08-05 ENCOUNTER — Ambulatory Visit: Payer: Federal, State, Local not specified - PPO | Admitting: Hematology

## 2017-08-05 DIAGNOSIS — I25118 Atherosclerotic heart disease of native coronary artery with other forms of angina pectoris: Secondary | ICD-10-CM | POA: Diagnosis not present

## 2017-08-05 DIAGNOSIS — Z7902 Long term (current) use of antithrombotics/antiplatelets: Secondary | ICD-10-CM | POA: Diagnosis not present

## 2017-08-05 DIAGNOSIS — Z7982 Long term (current) use of aspirin: Secondary | ICD-10-CM | POA: Diagnosis not present

## 2017-08-05 DIAGNOSIS — E785 Hyperlipidemia, unspecified: Secondary | ICD-10-CM | POA: Diagnosis not present

## 2017-08-05 DIAGNOSIS — F17210 Nicotine dependence, cigarettes, uncomplicated: Secondary | ICD-10-CM | POA: Diagnosis not present

## 2017-08-05 DIAGNOSIS — Z8673 Personal history of transient ischemic attack (TIA), and cerebral infarction without residual deficits: Secondary | ICD-10-CM | POA: Diagnosis not present

## 2017-08-05 DIAGNOSIS — K219 Gastro-esophageal reflux disease without esophagitis: Secondary | ICD-10-CM | POA: Diagnosis not present

## 2017-08-05 DIAGNOSIS — I2582 Chronic total occlusion of coronary artery: Secondary | ICD-10-CM | POA: Diagnosis not present

## 2017-08-05 DIAGNOSIS — I6523 Occlusion and stenosis of bilateral carotid arteries: Secondary | ICD-10-CM | POA: Diagnosis not present

## 2017-08-05 DIAGNOSIS — F329 Major depressive disorder, single episode, unspecified: Secondary | ICD-10-CM | POA: Diagnosis not present

## 2017-08-05 DIAGNOSIS — E119 Type 2 diabetes mellitus without complications: Secondary | ICD-10-CM | POA: Diagnosis not present

## 2017-08-05 LAB — BASIC METABOLIC PANEL
ANION GAP: 6 (ref 5–15)
BUN: 15 mg/dL (ref 6–20)
CO2: 24 mmol/L (ref 22–32)
Calcium: 8.5 mg/dL — ABNORMAL LOW (ref 8.9–10.3)
Chloride: 109 mmol/L (ref 101–111)
Creatinine, Ser: 0.76 mg/dL (ref 0.61–1.24)
GFR calc Af Amer: >60 mL/min (ref >60)
Glucose, Bld: 84 mg/dL (ref 65–99)
POTASSIUM: 3.8 mmol/L (ref 3.5–5.1)
Sodium: 139 mmol/L (ref 135–145)

## 2017-08-05 LAB — CBC
HEMATOCRIT: 39.6 % (ref 39.0–52.0)
HEMOGLOBIN: 12.7 g/dL — AB (ref 13.0–17.0)
MCH: 27.7 pg (ref 26.0–34.0)
MCHC: 32.1 g/dL (ref 30.0–36.0)
MCV: 86.5 fL (ref 78.0–100.0)
Platelets: 220 10*3/uL (ref 150–400)
RBC: 4.58 MIL/uL (ref 4.22–5.81)
RDW: 13.5 % (ref 11.5–15.5)
WBC: 9.1 10*3/uL (ref 4.0–10.5)

## 2017-08-05 MED ORDER — PANTOPRAZOLE SODIUM 40 MG PO TBEC: 40.0000 mg | DELAYED_RELEASE_TABLET | Freq: Every day | ORAL | 1 refills | Status: DC

## 2017-08-05 MED ORDER — NITROGLYCERIN 0.4 MG SL SUBL: 0.4000 mg | SUBLINGUAL_TABLET | SUBLINGUAL | 2 refills | Status: DC | PRN

## 2017-08-05 MED ORDER — CLOPIDOGREL BISULFATE 75 MG PO TABS: 75.0000 mg | ORAL_TABLET | Freq: Every day | ORAL | 2 refills | Status: DC

## 2017-08-05 NOTE — Progress Notes (Signed)
CARDIAC REHAB PHASE I   PRE:  Rate/Rhythm: 70 SR  BP:  Sitting: 150/63        SaO2: 98 RA  MODE:  Ambulation: 550 ft   POST:  Rate/Rhythm: 82 SR  BP:  Sitting: 147/75         SaO2: 98 RA  Pt ambulated 550 ft on RA, independent, steady gait, tolerated well with no complaints. Completed PIC/stent education.  Reviewed risk factors, tobacco cessation (gave pt fake cigarette), PCI book,  anti-platelet therapy, stent card, activity restrictions, ntg, exercise, heart healthy and diabetes diet handouts and phase 2 cardiac rehab. Pt verbalized understanding, receptive to education. Pt agrees to phase 2 cardiac rehab referral, will send to [facility] per pt request. Pt to recliner after walk, call bell within reach.     [phone] Lenna Sciara, RN, BSN 08/05/2017 9:26 AM

## 2017-08-05 NOTE — Discharge Summary (Signed)
Discharge Summary    Patient ID: Donald Jennings,  MRN: [id number], DOB/AGE: 60-19-1957 60 y.o.  Admit date: 08/04/2017 Discharge date: 08/05/2017  Primary Care Provider: Janith Lima Primary Cardiologist: Radford Pax  Discharge Diagnoses    Active Problems:   Coronary artery disease with exertional angina [facility])   Allergies No Known Allergies  Diagnostic Studies/Procedures    LHC: 08/04/17  Conclusion   1. Total occlusion of the mid LAD with left to left collaterals present. 2. Severe stenosis of the distal RCA with diffuse plaquing of the entire RCA and otherwise nonobstructive disease. 3. Patency of the left main and left circumflex with mild nonobstructive disease in the intermediate branch 4. Normal LV systolic function with normal LVEDP 5. Successful 2 vessel PCI with stenting of the chronically occluded mid LAD and stenting of the severely stenotic distal RCA lesion  Recommend: Dual antiplatelet therapy with aspirin and clopidogrel for a minimum of 12 months. Aggressive risk reduction measures.  _____________   History of Present Illness     Donald Jennings is a 61 y.o. male with a hx of with a history of hyperlipidemia, DM, HTN, TIA in his eye who was initially referred for evaluation of TIA with echo and carotid dopplers. Echo showed normal LVF with G1DD and carotid dopplers showed showed 1-39% stenosis bilaterally.  He had also been complaining of worsening SOB and DOE for the past year but only when walking up an incline in his back yard. He also had some atypical chest pain and had a stress test done that showed EF 51% with T wave inversions and 4mm of lateral ST depression during stress but no ischemia on nuclear images.  He underwent coronary CTA which showed a calcium score of 43 with mild calcium in the LAD < 40% in the prox and 50% in the mid LAD and 40% plaque in the distal RCA .  He is also a smoker.   He presented to the office on 07/27/17 for follow up with Dr. Radford Pax.   He denied any chest pain or pressure, SOB, DOE, PND, orthopnea, LE edema, dizziness, palpitations, syncope. He had been on Crestor 40mg  daily and stopped it due to back pain and was found to have a spot on his spine. Reported symptoms of exertional angina along with nausea, diaphoresis, and dyspnea. He was referred for outpatient cardiac cath.   Hospital Course     Underwent LHC with Dr. Burt Knack on 08/04/17 noted above with noted total occlusion of the mLAD with left to left collaterals treated with PCI/DES x1, and 75% lesion in the distal RCA treated with PCI/DES x1. Normal LV function and LVEDP. Plan for DAPT with ASA/plavix for at least one year. No complications noted post cath. Labs showed Cr 0.76 anf Hgb 12.7. Worked with cardiac rehab without any complications.    General: Well developed, well nourished, male appearing in no acute distress. Head: Normocephalic, atraumatic.  Neck: Supple without bruits, JVD. Lungs:  Resp regular and unlabored, CTA. Heart: RRR, S1, S2, no S3, S4, or murmur; no rub. Abdomen: Soft, non-tender, non-distended with normoactive bowel sounds. No hepatomegaly. No rebound/guarding. No obvious abdominal masses. Extremities: No clubbing, cyanosis, edema. Distal pedal pulses are 2+ bilaterally. R radial cath site stable without bruising or hematoma Neuro: Alert and oriented X 3. Moves all extremities spontaneously. Psych: Normal affect. CARDIAC cath and PCI Results: Coronary Diagrams   Diagnostic Diagram        Post-Intervention Diagram  Donald Jennings was seen by Dr. Tamala Julian and determined stable for discharge home. Follow up in the office has been arranged. Medications are listed below.   _____________  Discharge Vitals Blood pressure (!) 150/63, pulse 75, temperature 97.6 F (36.4 C), temperature source Oral, resp. rate 17, height 5\' 8"  (1.727 m), weight 158 lb 11.7 oz (72 kg), SpO2 98 %.  Filed Weights   08/04/17 1103 08/05/17 0501  Weight: 158 lb  (71.7 kg) 158 lb 11.7 oz (72 kg)    Labs & Radiologic Studies    CBC  Recent Labs  08/05/17 0144  WBC 9.1  HGB 12.7*  HCT 39.6  MCV 86.5  PLT 902   Basic Metabolic Panel  Recent Labs  08/05/17 0144  NA 139  K 3.8  CL 109  CO2 24  GLUCOSE 84  BUN 15  CREATININE 0.76  CALCIUM 8.5*   Liver Function Tests No results for input(s): AST, ALT, ALKPHOS, BILITOT, PROT, ALBUMIN in the last 72 hours. No results for input(s): LIPASE, AMYLASE in the last 72 hours. Cardiac Enzymes No results for input(s): CKTOTAL, CKMB, CKMBINDEX, TROPONINI in the last 72 hours. BNP Invalid input(s): POCBNP D-Dimer No results for input(s): DDIMER in the last 72 hours. Hemoglobin A1C No results for input(s): HGBA1C in the last 72 hours. Fasting Lipid Panel No results for input(s): CHOL, HDL, LDLCALC, TRIG, CHOLHDL, LDLDIRECT in the last 72 hours. Thyroid Function Tests No results for input(s): TSH, T4TOTAL, T3FREE, THYROIDAB in the last 72 hours.  Invalid input(s): FREET3 _____________  No results found. Disposition   Pt is being discharged home today in good condition.  Follow-up Plans & Appointments    Follow-up Information    Consuelo Pandy, PA-C Follow up on 08/17/2017.   Specialties:  Cardiology, Radiology Why:  at 11:30am for your follow up appt.  Contact information: [address] [place] [id number] [phone]            Discharge Medications     Medication List    STOP taking these medications   ibuprofen 200 MG tablet Commonly known as:  ADVIL,MOTRIN   omeprazole 20 MG tablet Commonly known as:  PRILOSEC OTC     TAKE these medications   aspirin EC 81 MG tablet Take 1 tablet (81 mg total) by mouth daily.   clopidogrel 75 MG tablet Commonly known as:  PLAVIX Take 1 tablet (75 mg total) by mouth daily with breakfast.   losartan 100 MG tablet Commonly known as:  COZAAR TAKE 1 TABLET (100 MG TOTAL) BY MOUTH DAILY.   metoprolol  succinate 25 MG 24 hr tablet Commonly known as:  TOPROL-XL TAKE 1 TABLET (25 MG TOTAL) BY MOUTH DAILY.   MULTIVITAMIN ADULT Tabs Take 1 tablet by mouth daily.   nitroGLYCERIN 0.4 MG SL tablet Commonly known as:  NITROSTAT Place 1 tablet (0.4 mg total) under the tongue every 5 (five) minutes as needed.   rosuvastatin 40 MG tablet Commonly known as:  CRESTOR Take 1 tablet (40 mg total) by mouth daily.   Vitamin D (Ergocalciferol) 50000 units Caps capsule Commonly known as:  DRISDOL TAKE 1 CAPSULE (50,000 UNITS TOTAL) BY MOUTH ONCE A WEEK.        Aspirin prescribed at discharge?  Yes High Intensity Statin Prescribed? (Lipitor 40-80mg  or Crestor 20-40mg ): Yes Beta Blocker Prescribed? Yes For EF <40%, was ACEI/ARB Prescribed? Yes ADP Receptor Inhibitor Prescribed? (i.e. Plavix etc.-Includes Medically Managed Patients): Yes For EF <40%, Aldosterone  Inhibitor Prescribed? No: EF ok Was EF assessed during THIS hospitalization? Yes Was Cardiac Rehab II ordered? (Included Medically managed Patients): Yes   Outstanding Labs/Studies   N/a  Duration of Discharge Encounter   Greater than 30 minutes including physician time.  Signed, Reino Bellis NP-C 08/05/2017, 8:55 AM The patient has been seen in conjunction with Reino Bellis, NP. All aspects of care have been considered and discussed. The patient has been personally interviewed, examined, and all clinical data has been reviewed.   Access site is unremarkable.  Digital images were reviewed. Excellent angiographic results from PCI of chronic occlusion of LAD and distal RCA high-grade obstructive disease.  Plan discharge today on high intensity statin therapy, dual antiplatelet therapy, ARB, and beta blocker.

## 2017-08-06 ENCOUNTER — Ambulatory Visit (HOSPITAL_COMMUNITY): Admission: RE | Admit: 2017-08-06 | Payer: Federal, State, Local not specified - PPO | Source: Ambulatory Visit

## 2017-08-09 ENCOUNTER — Ambulatory Visit: Payer: Federal, State, Local not specified - PPO | Admitting: Hematology

## 2017-08-12 ENCOUNTER — Other Ambulatory Visit: Payer: Self-pay | Admitting: Hematology

## 2017-08-12 ENCOUNTER — Telehealth (HOSPITAL_COMMUNITY): Payer: Self-pay

## 2017-08-12 DIAGNOSIS — M899 Disorder of bone, unspecified: Secondary | ICD-10-CM

## 2017-08-12 NOTE — Telephone Encounter (Signed)
Patient insurance is active and benefits verified. Patient insurance is BC[employer] - no co-payment, deductible $350/$350 has been met, out of pocket $5000/$3251.13 has been met, 15% co-insurance and no pre-authorization. Passport/reference # [id number].

## 2017-08-13 ENCOUNTER — Ambulatory Visit (HOSPITAL_COMMUNITY): Admission: RE | Admit: 2017-08-13 | Payer: Federal, State, Local not specified - PPO | Source: Ambulatory Visit

## 2017-08-13 ENCOUNTER — Other Ambulatory Visit: Payer: Self-pay | Admitting: Hematology

## 2017-08-16 ENCOUNTER — Ambulatory Visit (HOSPITAL_COMMUNITY)
Admission: RE | Admit: 2017-08-16 | Discharge: 2017-08-16 | Disposition: A | Discharge status: Home / Self Care | Payer: Federal, State, Local not specified - PPO | Source: Ambulatory Visit | Attending: Hematology | Admitting: Hematology

## 2017-08-16 ENCOUNTER — Telehealth (HOSPITAL_COMMUNITY): Payer: Self-pay

## 2017-08-16 DIAGNOSIS — M899 Disorder of bone, unspecified: Secondary | ICD-10-CM

## 2017-08-16 NOTE — Telephone Encounter (Signed)
I called and left message on voicemail to call office about scheduling for cardiac rehab. I left office contact information on patient voicemail to return call.  ° °

## 2017-08-17 ENCOUNTER — Ambulatory Visit (INDEPENDENT_AMBULATORY_CARE_PROVIDER_SITE_OTHER): Payer: Federal, State, Local not specified - PPO | Admitting: Cardiology

## 2017-08-17 ENCOUNTER — Encounter: Payer: Self-pay | Admitting: Cardiology

## 2017-08-17 VITALS — BP 118/60 | HR 86 | Ht 68.0 in | Wt 157.8 lb

## 2017-08-17 DIAGNOSIS — Z79899 Other long term (current) drug therapy: Secondary | ICD-10-CM | POA: Diagnosis not present

## 2017-08-17 DIAGNOSIS — I2510 Atherosclerotic heart disease of native coronary artery without angina pectoris: Secondary | ICD-10-CM

## 2017-08-17 DIAGNOSIS — E785 Hyperlipidemia, unspecified: Secondary | ICD-10-CM

## 2017-08-17 NOTE — Patient Instructions (Addendum)
Medication Instructions: Your physician recommends that you continue on your current medications as directed. Please refer to the Current Medication list given to you today.  Labwork: Your physician recommends that you return for lab work Tomorrow (08/18/17): FASTING Lipid and Hepatic function panel  Procedures/Testing: None Ordered  Follow-Up: Your physician recommends that you schedule a follow-up appointment in: 4 MONTHS with Dr. Radford Pax   If you need a refill on your cardiac medications before your next appointment, please call your pharmacy.

## 2017-08-17 NOTE — Progress Notes (Signed)
08/17/2017 Donald Jennings   1955/12/28  [id number]  Primary Physician Janith Lima, MD Primary Cardiologist: Dr. Radford Pax    Reason for Visit/CC: [facility] f/u for CAD s/p PCI   HPI:  61 y/o male with h/o CAD, HTN, HLD and prior h/o TIA, who presents to clinic today for post hospital follow-up. He was recently admitted for [facility] given recent symptoms concerning for unstable angina. He underwent LHC with Dr. Burt Knack on 08/04/17 and found to have total occlusion of the mLAD with left to left collaterals treated with PCI/DES x1, and 75% lesion in the distal RCA treated with PCI/DES x1. Normal LV function and LVEDP. Plan for DAPT with ASA/plavix for at least one year. No complications noted post cath. Labs showed Cr 0.76 anf Hgb 12. Also discharged home on a statin, beta blocker and ARB.  He presents to clinic today for post hospital follow-up. He reports that he has done well since discharge. He denies any recurrent chest pain or dyspnea. No exertional symptoms. He reports full medication compliance. His blood pressure is well-controlled at 118/60. Pulse rates in the 80s. He denies any issues with his right radial cath site. EKG shows NSR.   Current Meds  Medication Sig  . aspirin EC 81 MG tablet Take 1 tablet (81 mg total) by mouth daily.  . clopidogrel (PLAVIX) 75 MG tablet Take 1 tablet (75 mg total) by mouth daily with breakfast.  . losartan (COZAAR) 100 MG tablet TAKE 1 TABLET (100 MG TOTAL) BY MOUTH DAILY.  . metoprolol succinate (TOPROL-XL) 25 MG 24 hr tablet TAKE 1 TABLET (25 MG TOTAL) BY MOUTH DAILY.  . Multiple Vitamins-Minerals (MULTIVITAMIN ADULT) TABS Take 1 tablet by mouth daily.  . nitroGLYCERIN (NITROSTAT) 0.4 MG SL tablet Place 1 tablet (0.4 mg total) under the tongue every 5 (five) minutes as needed.  . pantoprazole (PROTONIX) 40 MG tablet Take 1 tablet (40 mg total) by mouth daily.  . rosuvastatin (CRESTOR) 40 MG tablet Take 1 tablet (40 mg total) by mouth daily.  . Vitamin D,  Ergocalciferol, (DRISDOL) 50000 units CAPS capsule TAKE 1 CAPSULE (50,000 UNITS TOTAL) BY MOUTH ONCE A WEEK.   No Known Allergies Past Medical History:  Diagnosis Date  . CAD (coronary artery disease)   . CAD (coronary artery disease), native coronary artery 07/27/2017   < 40% prox LAD, 50% mid LAD and 40% distal RCA  . Carotid artery stenosis 07/27/2017   1-39% bilateral  . CERVICAL RADICULOPATHY 07/02/2010   Qualifier: Diagnosis of  By: Ronnald Ramp MD, Arvid Right.   . DEPRESSION 03/21/2009   Qualifier: Diagnosis of  By: Ronnald Ramp MD, Arvid Right.   . Diabetes mellitus without complication ([place])   . GERD 06/19/2009   Qualifier: Diagnosis of  By: Ronnald Ramp MD, Arvid Right.   [name] HLD (hyperlipidemia)   . Hx of cardiovascular stress test    ETT-Myoview (03/2014):  No ischemia, EF 56%; Low Risk  . HYPERTENSION 06/19/2009   Qualifier: Diagnosis of  By: Ronnald Ramp MD, [place], RIGHT 07/02/2010   Qualifier: Diagnosis of  By: Ronnald Ramp MD, Arvid Right.   . SOB (shortness of breath) 06/10/2016  . TOBACCO USE 03/21/2009   Qualifier: Diagnosis of  By: Ronnald Ramp MD, Arvid Right.    Family History  Problem Relation Age of Onset  . Diabetes Mother   . Arthritis Mother   . Heart disease Father   . Stroke Father   . Stroke Brother   .  Cancer Neg Hx   . Early death Neg Hx   . Hypertension Neg Hx   . Hyperlipidemia Neg Hx   . Kidney disease Neg Hx    Past Surgical History:  Procedure Laterality Date  . CATARACT EXTRACTION Bilateral    Left 2009 at [facility], Right 2004 in [place]  . COLONOSCOPY  07/25/2015   Dr Carol Ada  . CORONARY STENT INTERVENTION N/A 08/04/2017   Procedure: CORONARY STENT INTERVENTION;  Surgeon: Sherren Mocha, MD;  Location: [place] [facility];  Service: Cardiovascular;  Laterality: N/A;  . INGUINAL HERNIA REPAIR Right 1986  . IR FLUORO GUIDED NEEDLE PLC ASPIRATION/INJECTION LOC  03/02/2017  . IR FLUORO GUIDED NEEDLE PLC ASPIRATION/INJECTION LOC  03/02/2017  . IR FLUORO GUIDED NEEDLE PLC  ASPIRATION/INJECTION LOC  03/02/2017  . IR RADIOLOGIST EVAL & MGMT  03/01/2017  . LEFT HEART CATH AND CORONARY ANGIOGRAPHY N/A 08/04/2017   Procedure: LEFT HEART CATH AND CORONARY ANGIOGRAPHY;  Surgeon: Sherren Mocha, MD;  Location: [place] [facility];  Service: Cardiovascular;  Laterality: N/A;   Social History   Social History  . Marital status: Married    Spouse name: N/A  . Number of children: 1  . Years of education: N/A   Occupational History  . Scientist, clinical (histocompatibility and immunogenetics)    Social History Main Topics  . Smoking status: Current Every Day Smoker    Packs/day: 1.00    Years: 38.50    Types: Cigarettes  . Smokeless tobacco: Never Used     Comment: Currently  smokes 1/2 ppd and is considering Chantix  . Alcohol use No  . Drug use: No  . Sexual activity: Not on file   Other Topics Concern  . Not on file   Social History Narrative   Married 1 sone born [place]   2 caffeine drinks daily   Updated 07/29/2016     Review of Systems: General: negative for chills, fever, night sweats or weight changes.  Cardiovascular: negative for chest pain, dyspnea on exertion, edema, orthopnea, palpitations, paroxysmal nocturnal dyspnea or shortness of breath Dermatological: negative for rash Respiratory: negative for cough or wheezing Urologic: negative for hematuria Abdominal: negative for nausea, vomiting, diarrhea, bright red blood per rectum, melena, or hematemesis Neurologic: negative for visual changes, syncope, or dizziness All other systems reviewed and are otherwise negative except as noted above.   Physical Exam:  Blood pressure 118/60, pulse 86, height 5\' 8"  (1.727 m), weight 157 lb 12.8 oz (71.6 kg), SpO2 95 %.  General appearance: alert, cooperative and no distress Neck: no carotid bruit and no JVD Lungs: clear to auscultation bilaterally Heart: regular rate and rhythm, S1, S2 normal, no murmur, click, rub or gallop Extremities: extremities normal, atraumatic,  no cyanosis or edema Pulses: 2+ and symmetric Skin: Skin color, texture, turgor normal. No rashes or lesions Neurologic: Grossly normal  EKG NSR 78 bpm -- personally reviewed   ASSESSMENT AND PLAN:   1. Coronary artery disease: Status post drug-eluting stent to the mid LAD and drug-eluting stent to the distal RCA 08/04/17. LVEF normal. He is stable without recurrent anginal symptomatology. Continue dual antiplatelet therapy with aspirin and Plavix for a minimum of one year. Continue beta blocker, statin and ARB.  2. Hypertension: controlled on current regimen.   3. Hyperlipidemia: on statin therapy with Crestor. No recent FLP on file. Last FLP was in 2017. We will order FLP and HFTs for assessment of LDL and liver enzymes. Goal LDL is <  70 mg/dL.  Follow-Up w/ Dr. Radford Pax in 3-4 months.   Brittainy Ladoris Gene, MHS [facility] 08/17/2017 12:16 PM

## 2017-08-18 ENCOUNTER — Ambulatory Visit (HOSPITAL_COMMUNITY)
Admission: RE | Admit: 2017-08-18 | Discharge: 2017-08-18 | Disposition: A | Discharge status: Home / Self Care | Payer: Federal, State, Local not specified - PPO | Source: Ambulatory Visit | Attending: Hematology | Admitting: Hematology

## 2017-08-18 ENCOUNTER — Telehealth (HOSPITAL_COMMUNITY): Payer: Self-pay

## 2017-08-18 ENCOUNTER — Ambulatory Visit: Payer: Federal, State, Local not specified - PPO | Admitting: Hematology

## 2017-08-18 ENCOUNTER — Other Ambulatory Visit: Payer: Federal, State, Local not specified - PPO

## 2017-08-18 DIAGNOSIS — G9519 Other vascular myelopathies: Secondary | ICD-10-CM | POA: Insufficient documentation

## 2017-08-18 DIAGNOSIS — E882 Lipomatosis, not elsewhere classified: Secondary | ICD-10-CM | POA: Diagnosis not present

## 2017-08-18 DIAGNOSIS — M899 Disorder of bone, unspecified: Secondary | ICD-10-CM | POA: Insufficient documentation

## 2017-08-18 DIAGNOSIS — M5134 Other intervertebral disc degeneration, thoracic region: Secondary | ICD-10-CM | POA: Diagnosis not present

## 2017-08-18 DIAGNOSIS — M5124 Other intervertebral disc displacement, thoracic region: Secondary | ICD-10-CM | POA: Diagnosis not present

## 2017-08-18 MED ORDER — GADOBENATE DIMEGLUMINE 529 MG/ML IV SOLN
15.0000 mL | Freq: Once | INTRAVENOUS | Status: AC | PRN
  Administered 2017-08-18: 14 mL via INTRAVENOUS

## 2017-08-18 NOTE — Telephone Encounter (Signed)
Cardiac Rehab Medication Review by a Pharmacist  Does the patient  feel that his/her medications are working for him/her?  yes  Has the patient been experiencing any side effects to the medications prescribed?  Yes (Having headaches and trouble sleeping)  Does the patient measure his/her own blood pressure or blood glucose at home?  no   Does the patient have any problems obtaining medications due to transportation or finances?   no  Understanding of regimen: good Understanding of indications: good Potential of compliance: good   Pharmacist comments: Donald Jennings is a pleasant 61 year old male whom is coming for a cardiac rehab appointment next week and I spoke with him about his medication and allergy history. He reports having headaches recently, but could not remember what medication to avoid taking, so I recommended Tylenol/acetaminophen, and listed the NSAIDs that he should avoid. Otherwise, he is having no troubles with his medication and has a decent grasp on what he is taking.  During the conversation, he mentioned wanted to start taking black cumin seed oil to prevent cancer. I pulled information on the herbal supplement and advised against starting it because it may increase his risk of bleeding secondary to platelet inhibition and explained that he is already on two antiplatelet agents.  Lastly, he is having difficulty with smoking cessation. I talked with him on tips for trying to cease smoking and advised him to talk with his physician for medications to help him. He mentioned concern for insurance coverage and I told him that there are several options that we can try.  Patterson Hammersmith, PharmD PGY1 Acute Care Resident 08/18/2017

## 2017-08-19 NOTE — Progress Notes (Signed)
Donald Jennings    HEMATOLOGY/ONCOLOGY CLINIC NOTE  Date of Service: 08/20/2017  Patient Care Team: Janith Lima, MD as PCP - General (Internal Medicine)  CHIEF COMPLAINTS/PURPOSE OF CONSULTATION:  Vertebral bone lesions ?plasma cell dyscrasias  HISTORY OF PRESENTING ILLNESS:   Donald Jennings is a wonderful 61 y.o. male who has been referred to [place] by Dr .Ronnald Ramp, Arvid Right, MD for evaluation and management of Thoracic vertebral lesions r/o plasmacytoma.  Patient is originally from [place] and has a history of hypertension, diabetes-diet-controlled, dyslipidemia, coronary artery disease, chronic smoker one pack per day and amaurosis fugax.  He was apparently in his usual state of health until about 8 months ago when he noted some mid back pain. Recently was admitted with worsening back pain and chest pain on 02/17/2017. He describes the chest pain as radiating from his back and positional. EKG did not show any ischemic changes. Troponin was not elevated. His chest pain was thought to be musculoskeletal and acute coronary syndrome was ruled out. He has previously had an intermediate risk stress test last year. For his back pain he had an MRI of the thoracic spine that showed lesions in T5, T6 and T8 that were read to be concerning for metastatic disease or myeloma. Patient had biopsies of all 3 lesions which showed benign bone and marrow elements with mild polytypic plasmacytosis of 5%. Unclear pathology.  patient had a PSA which was within normal limits at 0.58. Serum free light chains and their ratio were within normal limits. LDH was within normal limits. Beta-2 microglobulin normal. SPEP did not show monoclonal protein spike. C-reactive protein borderline elevated at 1.1. Sedimentation rate within normal limits ANA comprehensive profile negative Patient was not aware if he was to PPD test positive in the past. Quantiferon TB gold assay done and was noted to be negative. Patient notes lower back pain as  well. No other acute new focal symptoms.  Upper endoscopy in October 2017 and was found to have a benign esophageal stricture which was dilated. Colonoscopy was in 2016 and did not show any acute abnormalities. Low-dose CT of the chest was done on 07/20/2016 for lung cancer screening and did not show any overt concerning lesions.  INTERVAL HISTORY  Pt returns today for follow up and to discuss the results of his MRI T-spine. Pt reports he had catheterization on 08/04/17 and had 100% blockage on the right. Prior to the catheterization he felt chest tightness, fatigue and gum discomfort. He went to his cardiologist who was able to catch his sx in time so he didn't have any permanent heart damage. Post the procedure pt is feeling much better, but notes he is still struggling to quit smoking. He currently smokes 1.5 ppd. The cardiologist has given him some lite exercises to do. He hadn't worked in 2-3 weeks and states when he did go back his whole body ached the following day. MRI show degenerative arthritis and DDD with improved inflammation. No other concerning new bone lesions.    MEDICAL HISTORY:  Past Medical History:  Diagnosis Date  . CAD (coronary artery disease)   . CAD (coronary artery disease), native coronary artery 07/27/2017   < 40% prox LAD, 50% mid LAD and 40% distal RCA  . Carotid artery stenosis 07/27/2017   1-39% bilateral  . CERVICAL RADICULOPATHY 07/02/2010   Qualifier: Diagnosis of  By: Ronnald Ramp MD, Arvid Right.   . DEPRESSION 03/21/2009   Qualifier: Diagnosis of  By: Ronnald Ramp MD, Arvid Right.   Donald Jennings  Diabetes mellitus without complication ([place])   . GERD 06/19/2009   Qualifier: Diagnosis of  By: Ronnald Ramp MD, Arvid Right.   Donald Jennings HLD (hyperlipidemia)   . Hx of cardiovascular stress test    ETT-Myoview (03/2014):  No ischemia, EF 56%; Low Risk  . HYPERTENSION 06/19/2009   Qualifier: Diagnosis of  By: Ronnald Ramp MD, [place], RIGHT 07/02/2010   Qualifier: Diagnosis of  By: Ronnald Ramp MD, Arvid Right.   . SOB (shortness of breath) 06/10/2016  . TOBACCO USE 03/21/2009   Qualifier: Diagnosis of  By: Ronnald Ramp MD, Arvid Right.     SURGICAL HISTORY: Past Surgical History:  Procedure Laterality Date  . CATARACT EXTRACTION Bilateral    Left 2009 at [facility], Right 2004 in [place]  . COLONOSCOPY  07/25/2015   Dr Carol Ada  . CORONARY STENT INTERVENTION N/A 08/04/2017   Procedure: CORONARY STENT INTERVENTION;  Surgeon: Sherren Mocha, MD;  Location: [facility];  Service: Cardiovascular;  Laterality: N/A;  . INGUINAL HERNIA REPAIR Right 1986  . IR FLUORO GUIDED NEEDLE PLC ASPIRATION/INJECTION LOC  03/02/2017  . IR FLUORO GUIDED NEEDLE PLC ASPIRATION/INJECTION LOC  03/02/2017  . IR FLUORO GUIDED NEEDLE PLC ASPIRATION/INJECTION LOC  03/02/2017  . IR RADIOLOGIST EVAL & MGMT  03/01/2017  . LEFT HEART CATH AND CORONARY ANGIOGRAPHY N/A 08/04/2017   Procedure: LEFT HEART CATH AND CORONARY ANGIOGRAPHY;  Surgeon: Sherren Mocha, MD;  Location: [facility];  Service: Cardiovascular;  Laterality: N/A;    SOCIAL HISTORY: Social History   Social History  . Marital status: Married    Spouse name: N/A  . Number of children: 1  . Years of education: N/A   Occupational History  . Scientist, clinical (histocompatibility and immunogenetics)    Social History Main Topics  . Smoking status: Current Every Day Smoker    Packs/day: 1.00    Years: 38.50    Types: Cigarettes  . Smokeless tobacco: Never Used     Comment: Currently  smokes 1/2 ppd and is considering Chantix  . Alcohol use No  . Drug use: No  . Sexual activity: Not on file   Other Topics Concern  . Not on file   Social History Narrative   Married 1 sone born [place]   2 caffeine drinks daily   Updated 07/29/2016    FAMILY HISTORY: Family History  Problem Relation Age of Onset  . Diabetes Mother   . Arthritis Mother   . Heart disease Father   . Stroke Father   . Stroke Brother   . Cancer Neg Hx   . Early death Neg Hx   . Hypertension  Neg Hx   . Hyperlipidemia Neg Hx   . Kidney disease Neg Hx     ALLERGIES:  has No Known Allergies.  MEDICATIONS:  Current Outpatient Prescriptions  Medication Sig Dispense Refill  . aspirin EC 81 MG tablet Take 1 tablet (81 mg total) by mouth daily. 90 tablet 3  . clopidogrel (PLAVIX) 75 MG tablet Take 1 tablet (75 mg total) by mouth daily with breakfast. 90 tablet 2  . lidocaine (LIDODERM) 5 % Place 1 patch onto the skin daily. Remove & Discard patch within 12 hours or as directed by MD    . losartan (COZAAR) 100 MG tablet TAKE 1 TABLET (100 MG TOTAL) BY MOUTH DAILY. 90 tablet 3  . metoprolol succinate (TOPROL-XL) 25 MG 24 hr tablet TAKE 1 TABLET (25 MG TOTAL) BY  MOUTH DAILY. 90 tablet 1  . Multiple Vitamins-Minerals (MULTIVITAMIN ADULT) TABS Take 1 tablet by mouth daily.    . nitroGLYCERIN (NITROSTAT) 0.4 MG SL tablet Place 1 tablet (0.4 mg total) under the tongue every 5 (five) minutes as needed. 25 tablet 2  . pantoprazole (PROTONIX) 40 MG tablet Take 1 tablet (40 mg total) by mouth daily. 30 tablet 1  . rosuvastatin (CRESTOR) 40 MG tablet Take 1 tablet (40 mg total) by mouth daily. 30 tablet 11  . Vitamin D, Ergocalciferol, (DRISDOL) 50000 units CAPS capsule TAKE 1 CAPSULE (50,000 UNITS TOTAL) BY MOUTH ONCE A WEEK. 4 capsule 7   No current facility-administered medications for this visit.     REVIEW OF SYSTEMS:    10 Point review of Systems was done is negative except as noted above.  PHYSICAL EXAMINATION:  ECOG PERFORMANCE STATUS: 1 - Symptomatic but completely ambulatory  . Vitals:   08/20/17 0841  BP: 139/77  Pulse: 75  Resp: 18  Temp: 98.8 F (37.1 C)  SpO2: 100%   Filed Weights   08/20/17 0841  Weight: 158 lb 1.6 oz (71.7 kg)   .Body mass index is 24.04 kg/m.  GENERAL:alert, in no acute distress and comfortable SKIN: no acute rashes, no significant lesions EYES: conjunctiva are pink and non-injected, sclera anicteric OROPHARYNX: MMM, no exudates, no  oropharyngeal erythema or ulceration NECK: supple, no JVD LYMPH:  no palpable lymphadenopathy in the cervical, axillary or inguinal regions LUNGS: clear to auscultation b/l with normal respiratory effort HEART: regular rate & rhythm ABDOMEN:  normoactive bowel sounds , non tender, not distended. Extremity: no pedal edema PSYCH: alert & oriented x 3 with fluent speech NEURO: no focal motor/sensory deficits  LABORATORY DATA:  I have reviewed the data as listed  . CBC Latest Ref Rng & Units 08/05/2017 07/27/2017 03/24/2017  WBC 4.0 - 10.5 K/uL 9.1 8.8 8.8  Hemoglobin 13.0 - 17.0 g/dL 12.7(L) 13.6 13.9  Hematocrit 39.0 - 52.0 % 39.6 41.8 42.4  Platelets 150 - 400 K/uL 220 239 234    . CMP Latest Ref Rng & Units 08/05/2017 07/27/2017 03/24/2017  Glucose 65 - 99 mg/dL 84 88 107  BUN 6 - 20 mg/dL 15 17 21.8  Creatinine 0.61 - 1.24 mg/dL 0.76 0.90 1.0  Sodium 135 - 145 mmol/L 139 143 140  Potassium 3.5 - 5.1 mmol/L 3.8 4.4 4.5  Chloride 101 - 111 mmol/L 109 105 -  CO2 22 - 32 mmol/L 24 23 26   Calcium 8.9 - 10.3 mg/dL 8.5(L) 9.3 9.9  Total Protein 6.0 - 8.5 g/dL - - 8.5(H)  Total Bilirubin 0.20 - 1.20 mg/dL - - 0.29  Alkaline Phos 40 - 150 U/L - - 88  AST 5 - 34 U/L - - 20  ALT 0 - 55 U/L - - 19   Component     Latest Ref Rng & Units 03/24/2017 03/29/2017  IgG (Immunoglobin G), Serum     700 - 1600 mg/dL 1,257   IgA/Immunoglobulin A, Serum     90 - 386 mg/dL 259   IgM, Qn, Serum     20 - 172 mg/dL 65   Total Protein     6.0 - 8.5 g/dL 8.0   Albumin SerPl Elph-Mcnc     2.9 - 4.4 g/dL 4.6 (H)   Alpha 1     0.0 - 0.4 g/dL 0.2   Alpha2 Glob SerPl Elph-Mcnc     0.4 - 1.0 g/dL 0.8   B-Globulin SerPl  Elph-Mcnc     0.7 - 1.3 g/dL 1.2   Gamma Glob SerPl Elph-Mcnc     0.4 - 1.8 g/dL 1.2   M Protein SerPl Elph-Mcnc     Not Observed g/dL Not Observed   Globulin, Total     2.2 - 3.9 g/dL 3.4   Albumin/Glob SerPl     0.7 - 1.7 1.4   IFE 1      Comment   Please Note (HCV):       Comment   dsDNA Ab     0 - 9 IU/mL 2   ENA RNP Ab     0.0 - 0.9 AI 0.5   Smith Antibodies     0.0 - 0.9 AI 0.7   Scleroderma SCL-70     0.0 - 0.9 AI <0.2   ENA SSA (RO) Ab     0.0 - 0.9 AI <0.2   ENA SSB (LA) Ab     0.0 - 0.9 AI <0.2   Chromatin Ab SerPl-aCnc     0.0 - 0.9 AI <0.2   Anti JO-1     0.0 - 0.9 AI <0.2   CENTROMERE AB SCREEN     0.0 - 0.9 AI <0.2   SEE BELOW      Comment   Protein Urine Random     Not Estab. mg/dL  7.0  Prot,24hr calculated     30 - 150 mg/24 hr  186 (H)  ALBUMIN, U     %  33.4  Alpha-1-Globulin, U     %  0.9  ALPHA-2-GLOBULIN, U     %  13.6  Beta Globulin, U     %  33.9  Gamma Globulin, U     %  18.3  M-spike, %     Not Observed %  Not Observed  Please Note:       Comment  QUANTIFERON INCUBATION      Comment   QUANTIFERON TB GOLD     Negative Negative   QUANTIFERON CRITERIA      Comment   QUANTIFERON TB AG VALUE     IU/mL 0.05   Quantiferon Nil Value     IU/mL 0.01   QUANTIFERON MITOGEN VALUE     IU/mL 5.18   QFT TB AG MINUS NIL VALUE     IU/mL 0.04   Interpretation      Comment   Ig Kappa Free Light Chain     3.3 - 19.4 mg/L 20.7 (H)   Ig Lambda Free Light Chain     5.7 - 26.3 mg/L 17.1   Kappa/Lambda FluidC Ratio     0.26 - 1.65 1.21   RA Latex Turbid.     0.0 - 13.9 IU/mL <10.0   CCP Antibodies IgG/IgA     0 - 19 units 8   LDH     125 - 245 U/L 176   ANA Ab, IFA      Negative   Sed Rate     0 - 30 mm/hr 19   CRP     0.0 - 4.9 mg/L 1.9   Tryptase     2.2 - 13.2 ug/L 6.1   HIV     Non Reactive Non Reactive   Ferritin     22 - 316 ng/ml 85      RADIOGRAPHIC STUDIES: I have personally reviewed the radiological images as listed and agreed with the findings in the report. Mr Thoracic Spine W Wo Contrast  Result Date: 08/19/2017 IMPRESSION: 1. Resolved vertebral marrow edema at T5 and T6 since March. Regressed marrow edema at the T8 level, the residual of which has a typical appearance for degenerative  endplate edema, and is accompanied by mild T7 endplate edema. No new or suspicious marrow lesion. 2. Mild for age thoracic spine degeneration. Mild thoracic epidural lipomatosis. No significant spinal stenosis. Up to mild T9 and T10 neural foraminal stenosis.  Electronically Signed   By: Genevie Ann M.D.   On: 08/19/2017 09:51     MRI T spine 02/18/2017 IMPRESSION: Suspect lesions in the T5, T6 and T8 vertebral bodies suspicious for metastasis or myeloma. Recommend metastatic workup. No pathologic fracture.  Normal thoracic spinal cord.  Very shallow left paracentral disc protrusions at T6-7 and T7-8.  These results will be called to the ordering clinician or representative by the Radiologist Assistant, and communication documented in the PACS or zVision Dashboard.   Electronically Signed   By: Marijo Sanes M.D.   On: 02/18/2017 13:34  IMPRESSION: 1. Previously identified lesions involving the T5, T6, and T8 vertebral bodies demonstrate diffuse postcontrast enhancement. Again, findings are suspicious for possible metastases or myeloma. 2. No other focal lesions or abnormal enhancement identified within the thoracic spine.   Electronically Signed   By: Jeannine Boga M.D.   On: 02/18/2017 19:16  ASSESSMENT & PLAN:   61 year old male originally from [place] with   1) Diffusely enhancing lesions involving T5, T6 and T8 vertebral bodies with back pain. Biopsy as noted above showed only benign bone and marrow elements with mild polytypic plasmacytosis. Despite all the 3 lesions being biopsied that is no evidence of carcinoma or overt plasmacytoma.  Would need to consider other reactive processes PSA within normal limits Upper endoscopy in October 2017 and was found to have a benign esophageal stricture which was dilated. Colonoscopy was in 2016 and did not show any acute abnormalities. Low-dose CT of the chest was done on 07/20/2016 for lung cancer screening and  did not show any overt concerning lesions. PET/CT scan done shows that the lesions at T5, T6 and T8 do not have hypermetabolic activity and are thought to possibly represent degenerative changes.  SPEP, serum free light chains, LDH and beta-2 microglobulin are unrevealing with regards to a plasma cell dyscrasia. -Patient sedimentation rate and CRP are not significantly elevated to suggest a chronic osteomyelitis. -ANA profile negative -Quantiferon Gold TB assay - negative Plan -patient notes no overt new concerning interval symptoms. Back pain has improved. Recent labs -Discussed the findings of his MRI.  -Degenerative inflammation that has improved on rpt MRI T spine with no new concerning lesions.  2) Active smoker  -Counseled the patient extensively on absolute smoking cessation. -As of 08/20/17 he smokes 1.5 ppd  3). Patient Active Problem List   Diagnosis Date Noted  . Coronary artery disease with exertional angina ([place]) 08/04/2017  . Carotid artery stenosis 07/27/2017  . CAD (coronary artery disease), native coronary artery 07/27/2017  . Solitary plasmacytoma of bone ([place]) 03/05/2017  . Lytic bone lesions on xray 02/22/2017  . Vertebral artery stenosis, asymptomatic 06/22/2016  . Amaurosis fugax 06/03/2016  . Hyperlipidemia LDL goal <70 06/03/2016  . Abnormal thallium stress test 05/20/2016  . Dysphagia 05/18/2016  . Neuropathy of both feet 05/28/2015  . Eczema, dyshidrotic 05/27/2015  . Type 2 diabetes mellitus with manifestations ([place]) 03/27/2015  . Routine general medical examination at a health care facility 04/04/2013  . Vitamin D deficiency 04/04/2013  .  Essential hypertension 06/19/2009  . GERD 06/19/2009  . TOBACCO USE 03/21/2009  . ELECTROCARDIOGRAM, ABNORMAL 03/21/2009  Plan -Continue follow-up with primary care physician for continued management   RTC with Dr Irene Limbo PRN  All of the patients questions were answered with apparent satisfaction. The patient knows  to call the clinic with any problems, questions or concerns.  I spent 20 minutes counseling the patient face to face. The total time spent in the appointment was 20 minutes and more than 50% was on counseling and direct patient cares.    Sullivan Lone MD [place] AAHIVMS [facility] Hematology/Oncology Physician [facility]  (Office):       [phone] (Work cell):  [phone] (Fax):           [phone]  08/20/2017 9:03 AM  This document serves as a record of services personally performed by Sullivan Lone, MD. It was created on her behalf by Margit Banda, a trained medical scribe. The creation of this record is based on the scribe's personal observations and the provider's statements to them. This document has been checked and approved by the attending provider.

## 2017-08-20 ENCOUNTER — Telehealth: Payer: Self-pay | Admitting: Hematology

## 2017-08-20 ENCOUNTER — Ambulatory Visit (HOSPITAL_BASED_OUTPATIENT_CLINIC_OR_DEPARTMENT_OTHER): Payer: Federal, State, Local not specified - PPO | Admitting: Hematology

## 2017-08-20 ENCOUNTER — Encounter: Payer: Self-pay | Admitting: Hematology

## 2017-08-20 VITALS — BP 139/77 | HR 75 | Temp 98.8°F | Resp 18 | Ht 68.0 in | Wt 158.1 lb

## 2017-08-20 DIAGNOSIS — M899 Disorder of bone, unspecified: Secondary | ICD-10-CM | POA: Diagnosis not present

## 2017-08-20 DIAGNOSIS — Z720 Tobacco use: Secondary | ICD-10-CM

## 2017-08-20 NOTE — Telephone Encounter (Signed)
Per 9/21 los - Follow up PRN

## 2017-08-20 NOTE — Patient Instructions (Signed)
Thank you for choosing [facility] to provide your oncology and hematology care.  To afford each patient quality time with our providers, please arrive 30 minutes before your scheduled appointment time.  If you arrive late for your appointment, you may be asked to reschedule.  We strive to give you quality time with our providers, and arriving late affects you and other patients whose appointments are after yours.   If you are a no show for multiple scheduled visits, you may be dismissed from the clinic at the providers discretion.    Again, thank you for choosing [facility], our hope is that these requests will decrease the amount of time that you wait before being seen by our physicians.  ______________________________________________________________________  Should you have questions after your visit to the  Cancer Center, please contact our office at [phone] between the hours of 8:30 and 4:30 p.m.    Voicemails left after 4:30p.m will not be returned until the following business day.    For prescription refill requests, please have your pharmacy contact us directly.  Please also try to allow 48 hours for prescription requests.    Please contact the scheduling department for questions regarding scheduling.  For scheduling of procedures such as PET scans, CT scans, MRI, Ultrasound, etc please contact central scheduling at [phone].    Resources For Cancer Patients and Caregivers:   Oncolink.org:  A wonderful resource for patients and healthcare providers for information regarding your disease, ways to tract your treatment, what to expect, etc.     American Cancer Society:  800-227-2345  Can help patients locate various types of support and financial assistance  Cancer Care: 1-800-813-HOPE (4673) Provides financial assistance, online support groups, medication/co-pay assistance.    [place] DSS:  336-641-3447 Where to apply for food  stamps, Medicaid, and utility assistance  Medicare Rights Center: 800-333-4114 Helps people with Medicare understand their rights and benefits, navigate the Medicare system, and secure the quality healthcare they deserve  SCAT: 336-333-6589 [employer]'s shared-ride transportation service for eligible riders who have a disability that prevents them from riding the fixed route bus.    For additional information on assistance programs please contact our social worker:   Grier Hock/Abigail Elmore:  [phone]            

## 2017-08-23 ENCOUNTER — Telehealth (HOSPITAL_COMMUNITY): Payer: Self-pay

## 2017-08-23 NOTE — Telephone Encounter (Signed)
*  Updated insurance benefits*  BCBS - no co-payment, deductible $350/$350 has been met, out of pocket $5000/$3755.30 has been met, 15% co-insurance and no pre-authorization. Passport/reference [phone].

## 2017-08-24 ENCOUNTER — Encounter (HOSPITAL_COMMUNITY)
Admission: RE | Admit: 2017-08-24 | Discharge: 2017-08-24 | Disposition: A | Discharge status: Home / Self Care | Payer: Federal, State, Local not specified - PPO | Source: Ambulatory Visit | Attending: Cardiology | Admitting: Cardiology

## 2017-08-24 ENCOUNTER — Encounter (HOSPITAL_COMMUNITY): Payer: Self-pay

## 2017-08-24 VITALS — BP 112/64 | HR 74 | Ht 68.25 in | Wt 156.7 lb

## 2017-08-24 DIAGNOSIS — F329 Major depressive disorder, single episode, unspecified: Secondary | ICD-10-CM | POA: Diagnosis not present

## 2017-08-24 DIAGNOSIS — I6523 Occlusion and stenosis of bilateral carotid arteries: Secondary | ICD-10-CM | POA: Insufficient documentation

## 2017-08-24 DIAGNOSIS — Z7902 Long term (current) use of antithrombotics/antiplatelets: Secondary | ICD-10-CM | POA: Diagnosis not present

## 2017-08-24 DIAGNOSIS — E785 Hyperlipidemia, unspecified: Secondary | ICD-10-CM | POA: Insufficient documentation

## 2017-08-24 DIAGNOSIS — K219 Gastro-esophageal reflux disease without esophagitis: Secondary | ICD-10-CM | POA: Diagnosis not present

## 2017-08-24 DIAGNOSIS — I10 Essential (primary) hypertension: Secondary | ICD-10-CM | POA: Diagnosis not present

## 2017-08-24 DIAGNOSIS — F17210 Nicotine dependence, cigarettes, uncomplicated: Secondary | ICD-10-CM | POA: Diagnosis not present

## 2017-08-24 DIAGNOSIS — Z7982 Long term (current) use of aspirin: Secondary | ICD-10-CM | POA: Diagnosis not present

## 2017-08-24 DIAGNOSIS — E119 Type 2 diabetes mellitus without complications: Secondary | ICD-10-CM | POA: Insufficient documentation

## 2017-08-24 DIAGNOSIS — Z79899 Other long term (current) drug therapy: Secondary | ICD-10-CM | POA: Diagnosis not present

## 2017-08-24 DIAGNOSIS — Z955 Presence of coronary angioplasty implant and graft: Secondary | ICD-10-CM | POA: Diagnosis not present

## 2017-08-24 DIAGNOSIS — I2510 Atherosclerotic heart disease of native coronary artery without angina pectoris: Secondary | ICD-10-CM | POA: Insufficient documentation

## 2017-08-24 NOTE — Progress Notes (Signed)
Ki Corbo Santibanez 61 y.o. male DOB: [birth year] MRN: [id number]      Nutrition Note  1. Stented coronary artery    Past Medical History:  Diagnosis Date  . CAD (coronary artery disease)   . CAD (coronary artery disease), native coronary artery 07/27/2017   < 40% prox LAD, 50% mid LAD and 40% distal RCA  . Carotid artery stenosis 07/27/2017   1-39% bilateral  . CERVICAL RADICULOPATHY 07/02/2010   Qualifier: Diagnosis of  By: Ronnald Ramp MD, Arvid Right.   . DEPRESSION 03/21/2009   Qualifier: Diagnosis of  By: Ronnald Ramp MD, Arvid Right.   . Diabetes mellitus without complication ([place])   . GERD 06/19/2009   Qualifier: Diagnosis of  By: Ronnald Ramp MD, Arvid Right.   [name] HLD (hyperlipidemia)   . Hx of cardiovascular stress test    ETT-Myoview (03/2014):  No ischemia, EF 56%; Low Risk  . HYPERTENSION 06/19/2009   Qualifier: Diagnosis of  By: Ronnald Ramp MD, [place], RIGHT 07/02/2010   Qualifier: Diagnosis of  By: Ronnald Ramp MD, Arvid Right.   . SOB (shortness of breath) 06/10/2016  . TOBACCO USE 03/21/2009   Qualifier: Diagnosis of  By: Ronnald Ramp MD, Arvid Right.    Meds reviewed. MVI, Vitamin D noted  HT: Ht Readings from Last 1 Encounters:  08/24/17 5' 8.25" (1.734 m)    WT: Wt Readings from Last 3 Encounters:  08/24/17 156 lb 12 oz (71.1 kg)  08/20/17 158 lb 1.6 oz (71.7 kg)  08/17/17 157 lb 12.8 oz (71.6 kg)     BMI 23.7   Current tobacco use? Yes, working towards quitting tobacco use  Labs:  Lipid Panel     Component Value Date/Time   CHOL 93 (L) 08/14/2016 1155   TRIG 126 08/14/2016 1155   HDL 32 (L) 08/14/2016 1155   CHOLHDL 2.9 08/14/2016 1155   VLDL 25 08/14/2016 1155   LDLCALC 36 08/14/2016 1155   LDLDIRECT 167.4 03/21/2009 1030    Lab Results  Component Value Date   HGBA1C 6.8 05/18/2016   CBG (last 3)  No results for input(s): GLUCAP in the last 72 hours.  Nutrition Note Spoke with pt. Nutrition plan and goals reviewed with pt. Pt is following Step 1 of the Therapeutic Lifestyle Changes  diet. Pt is a diet-controlled diabetic. Last A1c available indicates blood glucose well-controlled. Pt does not check CBG's. Pt expressed understanding of the information reviewed. Pt aware of nutrition education classes offered and plans on attending nutrition classes.  Nutrition Diagnosis ? Food-and nutrition-related knowledge deficit related to lack of exposure to information as related to diagnosis of: ? CVD ? DM  Nutrition Intervention ? Pt's individual nutrition plan and goals reviewed with pt. ? Pt given handouts for: ? Nutrition I class ? Nutrition II class ? Diabetes Blitz Class   Nutrition Goal(s):  ? Pt to identify and limit food sources of saturated fat, trans fat, and sodium ? Pt able to name foods that affect blood glucose   Plan:  Pt to attend nutrition classes ? Nutrition I ? Nutrition II ? Portion Distortion ? Diabetes Blitz ? Diabetes Q & A Will provide client-centered nutrition education as part of interdisciplinary care.   Monitor and evaluate progress toward nutrition goal with team.  Derek M[place], M.Ed, RD, LDN, CDE 08/24/2017 11:40 AM

## 2017-08-24 NOTE — Progress Notes (Signed)
Cardiac Individual Treatment Plan  Patient Details  Name: Donald Jennings [facility] MRN: [id number] Date of Birth: Apr 01, 1956 Referring Provider:     [place] from 08/24/2017 in [place]  Referring Provider  Fransico Him MD      Initial Encounter Date:    CARDIAC REHAB PHASE II ORIENTATION from 08/24/2017 in [place]  Date  08/24/17  Referring Provider  Fransico Him MD      Visit Diagnosis: Stented coronary artery 08/04/2017 DES pLAD, d RCA  Patient's Home Medications on Admission:  Current Outpatient Prescriptions:  .  aspirin EC 81 MG tablet, Take 1 tablet (81 mg total) by mouth daily., Disp: 90 tablet, Rfl: 3 .  clopidogrel (PLAVIX) 75 MG tablet, Take 1 tablet (75 mg total) by mouth daily with breakfast., Disp: 90 tablet, Rfl: 2 .  lidocaine (LIDODERM) 5 %, Place 1 patch onto the skin daily. Remove & Discard patch within 12 hours or as directed by MD, Disp: , Rfl:  .  losartan (COZAAR) 100 MG tablet, TAKE 1 TABLET (100 MG TOTAL) BY MOUTH DAILY., Disp: 90 tablet, Rfl: 3 .  metoprolol succinate (TOPROL-XL) 25 MG 24 hr tablet, TAKE 1 TABLET (25 MG TOTAL) BY MOUTH DAILY., Disp: 90 tablet, Rfl: 1 .  Multiple Vitamins-Minerals (MULTIVITAMIN ADULT) TABS, Take 1 tablet by mouth daily., Disp: , Rfl:  .  nitroGLYCERIN (NITROSTAT) 0.4 MG SL tablet, Place 1 tablet (0.4 mg total) under the tongue every 5 (five) minutes as needed., Disp: 25 tablet, Rfl: 2 .  pantoprazole (PROTONIX) 40 MG tablet, Take 1 tablet (40 mg total) by mouth daily., Disp: 30 tablet, Rfl: 1 .  rosuvastatin (CRESTOR) 40 MG tablet, Take 1 tablet (40 mg total) by mouth daily., Disp: 30 tablet, Rfl: 11 .  Vitamin D, Ergocalciferol, (DRISDOL) 50000 units CAPS capsule, TAKE 1 CAPSULE (50,000 UNITS TOTAL) BY MOUTH ONCE A WEEK., Disp: 4 capsule, Rfl: 7  Past Medical History: Past Medical History:  Diagnosis Date  . CAD (coronary artery disease)   .  CAD (coronary artery disease), native coronary artery 07/27/2017   < 40% prox LAD, 50% mid LAD and 40% distal RCA  . Carotid artery stenosis 07/27/2017   1-39% bilateral  . CERVICAL RADICULOPATHY 07/02/2010   Qualifier: Diagnosis of  By: Ronnald Ramp MD, Arvid Right.   . DEPRESSION 03/21/2009   Qualifier: Diagnosis of  By: Ronnald Ramp MD, Arvid Right.   . Diabetes mellitus without complication ([place])   . GERD 06/19/2009   Qualifier: Diagnosis of  By: Ronnald Ramp MD, Arvid Right.   [name] HLD (hyperlipidemia)   . Hx of cardiovascular stress test    ETT-Myoview (03/2014):  No ischemia, EF 56%; Low Risk  . HYPERTENSION 06/19/2009   Qualifier: Diagnosis of  By: Ronnald Ramp MD, [place], RIGHT 07/02/2010   Qualifier: Diagnosis of  By: Ronnald Ramp MD, Arvid Right.   . SOB (shortness of breath) 06/10/2016  . TOBACCO USE 03/21/2009   Qualifier: Diagnosis of  By: Ronnald Ramp MD, Arvid Right.     Tobacco Use: History  Smoking Status  . Current Every Day Smoker  . Packs/day: 1.00  . Years: 38.50  . Types: Cigarettes  Smokeless Tobacco  . Never Used    Comment: Currently  smokes 1/2 ppd and is considering Chantix    Labs: Recent Review Flowsheet Data    Labs for ITP Cardiac and Pulmonary Rehab Latest Ref Rng & Units 01/16/2015 03/27/2015  05/27/2015 05/18/2016 08/14/2016   Cholestrol 125 - 200 mg/dL - 208(H) - 243(H) 93(L)   LDLCALC <130 mg/dL - 133(H) - 173(H) 36   LDLDIRECT mg/dL - - - - -   HDL >=40 mg/dL - 35.50(L) - 39.10 32(L)   Trlycerides <150 mg/dL - 199.0(H) - 151.0(H) 126   Hemoglobin A1c - 6.6(H) 6.5 6.3 6.8 -      Capillary Blood Glucose: Lab Results  Component Value Date   GLUCAP 111 (H) 04/06/2017     Exercise Target Goals: Date: 08/24/17  Exercise Program Goal: Individual exercise prescription set with THRR, safety & activity barriers. Participant demonstrates ability to understand and report RPE using BORG scale, to self-measure pulse accurately, and to acknowledge the importance of the exercise  prescription.  Exercise Prescription Goal: Starting with aerobic activity 30 plus minutes a day, 3 days per week for initial exercise prescription. Provide home exercise prescription and guidelines that participant acknowledges understanding prior to discharge.  Activity Barriers & Risk Stratification:     Activity Barriers & Cardiac Risk Stratification - 08/24/17 1110      Activity Barriers & Cardiac Risk Stratification   Activity Barriers Arthritis;Other (comment)   Comments arthritis in R hand, aches and pain in back (buldging disc in T-spine), R frozen shoulder, occasional L knee discomfort/swelling tx with cortisone injections   Cardiac Risk Stratification High      6 Minute Walk:     6 Minute Walk    Row Name 08/24/17 1116         6 Minute Walk   Phase Initial     Distance 1892 feet     Walk Time 6 minutes     # of Rest Breaks 0     MPH 3.58     METS 4.86     RPE 12     VO2 Peak 17.02     Symptoms No     Resting HR 74 bpm     Resting BP 112/64     Resting Oxygen Saturation  98 %     Exercise Oxygen Saturation  during 6 min walk 100 %     Max Ex. HR 105 bpm     Max Ex. BP 142/62     2 Minute Post BP 106/66        Oxygen Initial Assessment:   Oxygen Re-Evaluation:   Oxygen Discharge (Final Oxygen Re-Evaluation):   Initial Exercise Prescription:     Initial Exercise Prescription - 08/24/17 1100      Date of Initial Exercise RX and Referring Provider   Date 08/24/17   Referring Provider Fransico Him MD     Treadmill   MPH 3   Grade 0   Minutes 10   METs 3.3     Bike   Level 1   Minutes 10   METs 3.7     NuStep   Level 4   SPM 80   Minutes 10   METs 3     Prescription Details   Frequency (times per week) 3   Duration Progress to 30 minutes of continuous aerobic without signs/symptoms of physical distress     Intensity   THRR 40-80% of Max Heartrate 64-128   Ratings of Perceived Exertion 11-13   Perceived Dyspnea 0-4      Progression   Progression Continue to progress workloads to maintain intensity without signs/symptoms of physical distress.     Resistance Training   Training Prescription Yes   Weight 4lbs  Reps 10-15      Perform Capillary Blood Glucose checks as needed.  Exercise Prescription Changes:   Exercise Comments:   Exercise Goals and Review:     Exercise Goals    Row Name 08/24/17 1112             Exercise Goals   Increase Physical Activity Yes       Intervention Provide advice, education, support and counseling about physical activity/exercise needs.;Develop an individualized exercise prescription for aerobic and resistive training based on initial evaluation findings, risk stratification, comorbidities and participant's personal goals.       Expected Outcomes Achievement of increased cardiorespiratory fitness and enhanced flexibility, muscular endurance and strength shown through measurements of functional capacity and personal statement of participant.       Increase Strength and Stamina Yes  improve physical fitness and be more agile; increase flexibility       Intervention Provide advice, education, support and counseling about physical activity/exercise needs.;Develop an individualized exercise prescription for aerobic and resistive training based on initial evaluation findings, risk stratification, comorbidities and participant's personal goals.       Expected Outcomes Achievement of increased cardiorespiratory fitness and enhanced flexibility, muscular endurance and strength shown through measurements of functional capacity and personal statement of participant.       Able to understand and use rate of perceived exertion (RPE) scale Yes       Intervention Provide education and explanation on how to use RPE scale       Expected Outcomes Short Term: Able to use RPE daily in rehab to express subjective intensity level;Long Term:  Able to use RPE to guide intensity level when  exercising independently       Knowledge and understanding of Target Heart Rate Range (THRR) Yes       Intervention Provide education and explanation of THRR including how the numbers were predicted and where they are located for reference       Expected Outcomes Short Term: Able to state/look up THRR;Long Term: Able to use THRR to govern intensity when exercising independently;Short Term: Able to use daily as guideline for intensity in rehab       Able to check pulse independently Yes       Intervention Provide education and demonstration on how to check pulse in carotid and radial arteries.;Review the importance of being able to check your own pulse for safety during independent exercise       Expected Outcomes Short Term: Able to explain why pulse checking is important during independent exercise;Long Term: Able to check pulse independently and accurately       Understanding of Exercise Prescription Yes       Intervention Provide education, explanation, and written materials on patient's individual exercise prescription       Expected Outcomes Short Term: Able to explain program exercise prescription;Long Term: Able to explain home exercise prescription to exercise independently          Exercise Goals Re-Evaluation :    Discharge Exercise Prescription (Final Exercise Prescription Changes):   Nutrition:  Target Goals: Understanding of nutrition guidelines, daily intake of sodium 1500mg , cholesterol 200mg , calories 30% from fat and 7% or less from saturated fats, daily to have 5 or more servings of fruits and vegetables.  Biometrics:     Pre Biometrics - 08/24/17 1223      Pre Biometrics   Height 5' 8.25" (1.734 m)   Weight 156 lb 12 oz (71.1 kg)  Waist Circumference 34 inches   Hip Circumference 37.5 inches   Waist to Hip Ratio 0.91 %   BMI (Calculated) 23.65   Triceps Skinfold 14 mm   % Body Fat 22.7 %   Grip Strength 37 kg   Flexibility 7.5 in   Single Leg Stand 28.31  seconds       Nutrition Therapy Plan and Nutrition Goals:     Nutrition Therapy & Goals - 08/24/17 1137      Nutrition Therapy   Diet Carb Modified, Therapeutic Lifestyle Changes     Personal Nutrition Goals   Nutrition Goal Pt to identify and limit food sources of saturated fat, trans fat, and sodium   Personal Goal #2 Pt able to name foods that affect blood glucose      Intervention Plan   Intervention Prescribe, educate and counsel regarding individualized specific dietary modifications aiming towards targeted core components such as weight, hypertension, lipid management, diabetes, heart failure and other comorbidities.   Expected Outcomes Short Term Goal: Understand basic principles of dietary content, such as calories, fat, sodium, cholesterol and nutrients.;Long Term Goal: Adherence to prescribed nutrition plan.      Nutrition Discharge: Nutrition Scores:     Nutrition Assessments - 08/24/17 1137      MEDFICTS Scores   Pre Score 62      Nutrition Goals Re-Evaluation:   Nutrition Goals Re-Evaluation:   Nutrition Goals Discharge (Final Nutrition Goals Re-Evaluation):   Psychosocial: Target Goals: Acknowledge presence or absence of significant depression and/or stress, maximize coping skills, provide positive support system. Participant is able to verbalize types and ability to use techniques and skills needed for reducing stress and depression.  Initial Review & Psychosocial Screening:     Initial Psych Review & Screening - 08/24/17 0914      Initial Review   Current issues with None Identified     Family Dynamics   Good Support System? Yes  spouse   Comments upon brief assessment, no psychosocial needs identified, no interventions necessary      Barriers   Psychosocial barriers to participate in program There are no identifiable barriers or psychosocial needs.     Screening Interventions   Interventions Encouraged to exercise;Provide feedback about  the scores to participant      Quality of Life Scores:     Quality of Life - 08/24/17 0913      Quality of Life Scores   Health/Function Pre 18.7 %   Socioeconomic Pre 20.38 %   Psych/Spiritual Pre 18.5 %   Family Pre 21.6 %   GLOBAL Pre 19.47 %      PHQ-9: Recent Review Flowsheet Data    There is no flowsheet data to display.     Interpretation of Total Score  Total Score Depression Severity:  1-4 = Minimal depression, 5-9 = Mild depression, 10-14 = Moderate depression, 15-19 = Moderately severe depression, 20-27 = Severe depression   Psychosocial Evaluation and Intervention:   Psychosocial Re-Evaluation:   Psychosocial Discharge (Final Psychosocial Re-Evaluation):   Vocational Rehabilitation: Provide vocational rehab assistance to qualifying candidates.   Vocational Rehab Evaluation & Intervention:     Vocational Rehab - 08/24/17 0915      Initial Vocational Rehab Evaluation & Intervention   Assessment shows need for Vocational Rehabilitation No      Education: Education Goals: Education classes will be provided on a weekly basis, covering required topics. Participant will state understanding/return demonstration of topics presented.  Learning Barriers/Preferences:  Learning Barriers/Preferences - 08/24/17 1110      Learning Barriers/Preferences   Learning Barriers Sight   Learning Preferences Written Material;Verbal Instruction;Skilled Demonstration;Pictoral;Video      Education Topics: Count Your Pulse:  -Group instruction provided by verbal instruction, demonstration, patient participation and written materials to support subject.  Instructors address importance of being able to find your pulse and how to count your pulse when at home without a heart monitor.  Patients get hands on experience counting their pulse with staff help and individually.   Heart Attack, Angina, and Risk Factor Modification:  -Group instruction provided by verbal  instruction, video, and written materials to support subject.  Instructors address signs and symptoms of angina and heart attacks.    Also discuss risk factors for heart disease and how to make changes to improve heart health risk factors.   Functional Fitness:  -Group instruction provided by verbal instruction, demonstration, patient participation, and written materials to support subject.  Instructors address safety measures for doing things around the house.  Discuss how to get up and down off the floor, how to pick things up properly, how to safely get out of a chair without assistance, and balance training.   Meditation and Mindfulness:  -Group instruction provided by verbal instruction, patient participation, and written materials to support subject.  Instructor addresses importance of mindfulness and meditation practice to help reduce stress and improve awareness.  Instructor also leads participants through a meditation exercise.    Stretching for Flexibility and Mobility:  -Group instruction provided by verbal instruction, patient participation, and written materials to support subject.  Instructors lead participants through series of stretches that are designed to increase flexibility thus improving mobility.  These stretches are additional exercise for major muscle groups that are typically performed during regular warm up and cool down.   Hands Only CPR:  -Group verbal, video, and participation provides a basic overview of AHA guidelines for community CPR. Role-play of emergencies allow participants the opportunity to practice calling for help and chest compression technique with discussion of AED use.   Hypertension: -Group verbal and written instruction that provides a basic overview of hypertension including the most recent diagnostic guidelines, risk factor reduction with self-care instructions and medication management.    Nutrition I class: Heart Healthy Eating:  -Group  instruction provided by PowerPoint slides, verbal discussion, and written materials to support subject matter. The instructor gives an explanation and review of the Therapeutic Lifestyle Changes diet recommendations, which includes a discussion on lipid goals, dietary fat, sodium, fiber, plant stanol/sterol esters, sugar, and the components of a well-balanced, healthy diet.   CARDIAC REHAB PHASE II ORIENTATION from 08/24/2017 in [place]  Date  08/24/17  Educator  RD  Instruction Review Code  Not applicable      Nutrition II class: Lifestyle Skills:  -Group instruction provided by PowerPoint slides, verbal discussion, and written materials to support subject matter. The instructor gives an explanation and review of label reading, grocery shopping for heart health, heart healthy recipe modifications, and ways to make healthier choices when eating out.   CARDIAC REHAB PHASE II ORIENTATION from 08/24/2017 in [place]  Date  08/24/17  Educator  RD  Instruction Review Code  Not applicable      Diabetes Question & Answer:  -Group instruction provided by PowerPoint slides, verbal discussion, and written materials to support subject matter. The instructor gives an explanation and review of diabetes co-morbidities, pre- and  post-prandial blood glucose goals, pre-exercise blood glucose goals, signs, symptoms, and treatment of hypoglycemia and hyperglycemia, and foot care basics.   Diabetes Blitz:  -Group instruction provided by PowerPoint slides, verbal discussion, and written materials to support subject matter. The instructor gives an explanation and review of the physiology behind type 1 and type 2 diabetes, diabetes medications and rational behind using different medications, pre- and post-prandial blood glucose recommendations and Hemoglobin A1c goals, diabetes diet, and exercise including blood glucose guidelines for exercising  safely.    CARDIAC REHAB PHASE II ORIENTATION from 08/24/2017 in [place]  Date  08/24/17  Educator  RD  Instruction Review Code  Not applicable      Portion Distortion:  -Group instruction provided by PowerPoint slides, verbal discussion, written materials, and food models to support subject matter. The instructor gives an explanation of serving size versus portion size, changes in portions sizes over the last 20 years, and what consists of a serving from each food group.   Stress Management:  -Group instruction provided by verbal instruction, video, and written materials to support subject matter.  Instructors review role of stress in heart disease and how to cope with stress positively.     Exercising on Your Own:  -Group instruction provided by verbal instruction, power point, and written materials to support subject.  Instructors discuss benefits of exercise, components of exercise, frequency and intensity of exercise, and end points for exercise.  Also discuss use of nitroglycerin and activating EMS.  Review options of places to exercise outside of rehab.  Review guidelines for sex with heart disease.   Cardiac Drugs I:  -Group instruction provided by verbal instruction and written materials to support subject.  Instructor reviews cardiac drug classes: antiplatelets, anticoagulants, beta blockers, and statins.  Instructor discusses reasons, side effects, and lifestyle considerations for each drug class.   Cardiac Drugs II:  -Group instruction provided by verbal instruction and written materials to support subject.  Instructor reviews cardiac drug classes: angiotensin converting enzyme inhibitors (ACE-I), angiotensin II receptor blockers (ARBs), nitrates, and calcium channel blockers.  Instructor discusses reasons, side effects, and lifestyle considerations for each drug class.   Anatomy and Physiology of the Circulatory System:  Group verbal and  written instruction and models provide basic cardiac anatomy and physiology, with the coronary electrical and arterial systems. Review of: AMI, Angina, Valve disease, Heart Failure, Peripheral Artery Disease, Cardiac Arrhythmia, Pacemakers, and the ICD.   Other Education:  -Group or individual verbal, written, or video instructions that support the educational goals of the cardiac rehab program.   Knowledge Questionnaire Score:     Knowledge Questionnaire Score - 08/24/17 0913      Knowledge Questionnaire Score   Pre Score 23/28     DM 10/15      Core Components/Risk Factors/Patient Goals at Admission:     Personal Goals and Risk Factors at Admission - 08/24/17 1113      Core Components/Risk Factors/Patient Goals on Admission   Tobacco Cessation Yes   Number of packs per day 1 ppd   Intervention Assist the participant in steps to quit. Provide individualized education and counseling about committing to Tobacco Cessation, relapse prevention, and pharmacological support that can be provided by physician.;Advice worker, assist with locating and accessing local/national Quit Smoking programs, and support quit date choice.   Expected Outcomes Short Term: Will demonstrate readiness to quit, by selecting a quit date.;Short Term: Will quit all tobacco product use, adhering to  prevention of relapse plan.;Long Term: Complete abstinence from all tobacco products for at least 12 months from quit date.   Diabetes Yes   Intervention Provide education about signs/symptoms and action to take for hypo/hyperglycemia.;Provide education about proper nutrition, including hydration, and aerobic/resistive exercise prescription along with prescribed medications to achieve blood glucose in normal ranges: Fasting glucose 65-99 mg/dL   Expected Outcomes Short Term: Participant verbalizes understanding of the signs/symptoms and immediate care of hyper/hypoglycemia, proper foot care and importance of  medication, aerobic/resistive exercise and nutrition plan for blood glucose control.;Long Term: Attainment of HbA1C < 7%.   Hypertension Yes   Intervention Provide education on lifestyle modifcations including regular physical activity/exercise, weight management, moderate sodium restriction and increased consumption of fresh fruit, vegetables, and low fat dairy, alcohol moderation, and smoking cessation.;Monitor prescription use compliance.   Expected Outcomes Long Term: Maintenance of blood pressure at goal levels.;Short Term: Continued assessment and intervention until BP is < 140/48mm HG in hypertensive participants. < 130/5mm HG in hypertensive participants with diabetes, heart failure or chronic kidney disease.   Lipids Yes   Intervention Provide education and support for participant on nutrition & aerobic/resistive exercise along with prescribed medications to achieve LDL 70mg , HDL >40mg .   Expected Outcomes Short Term: Participant states understanding of desired cholesterol values and is compliant with medications prescribed. Participant is following exercise prescription and nutrition guidelines.;Long Term: Cholesterol controlled with medications as prescribed, with individualized exercise RX and with personalized nutrition plan. Value goals: LDL < 70mg , HDL > 40 mg.      Core Components/Risk Factors/Patient Goals Review:    Core Components/Risk Factors/Patient Goals at Discharge (Final Review):    ITP Comments:     ITP Comments    Row Name 08/24/17 1108           ITP Comments Dr. Fransico Him, Medical Director          Comments: Donald Jennings attended orientation from 0800 to 1000 to review rules and guidelines for program. Completed 6 minute walk test, Intitial ITP, and exercise prescription.  VSS. Telemetry-Sinus .  Asymptomatic. Donald Jennings continues to smoke 1PPD. Patient given smoking cessation information and the phone number to 1-800-[email or web address].[email or web address] Pall, RN,BSN 08/24/2017 12:32  PM

## 2017-08-25 ENCOUNTER — Encounter: Payer: Self-pay | Admitting: Acute Care

## 2017-08-30 ENCOUNTER — Encounter (HOSPITAL_COMMUNITY)
Admission: RE | Admit: 2017-08-30 | Discharge: 2017-08-30 | Disposition: A | Discharge status: Home / Self Care | Payer: Federal, State, Local not specified - PPO | Source: Ambulatory Visit | Attending: Cardiology | Admitting: Cardiology

## 2017-08-30 DIAGNOSIS — E785 Hyperlipidemia, unspecified: Secondary | ICD-10-CM | POA: Diagnosis not present

## 2017-08-30 DIAGNOSIS — F329 Major depressive disorder, single episode, unspecified: Secondary | ICD-10-CM | POA: Diagnosis not present

## 2017-08-30 DIAGNOSIS — I6523 Occlusion and stenosis of bilateral carotid arteries: Secondary | ICD-10-CM | POA: Insufficient documentation

## 2017-08-30 DIAGNOSIS — Z955 Presence of coronary angioplasty implant and graft: Secondary | ICD-10-CM

## 2017-08-30 DIAGNOSIS — K219 Gastro-esophageal reflux disease without esophagitis: Secondary | ICD-10-CM | POA: Insufficient documentation

## 2017-08-30 DIAGNOSIS — Z7982 Long term (current) use of aspirin: Secondary | ICD-10-CM | POA: Insufficient documentation

## 2017-08-30 DIAGNOSIS — Z7902 Long term (current) use of antithrombotics/antiplatelets: Secondary | ICD-10-CM | POA: Insufficient documentation

## 2017-08-30 DIAGNOSIS — F17210 Nicotine dependence, cigarettes, uncomplicated: Secondary | ICD-10-CM | POA: Diagnosis not present

## 2017-08-30 DIAGNOSIS — I10 Essential (primary) hypertension: Secondary | ICD-10-CM | POA: Insufficient documentation

## 2017-08-30 DIAGNOSIS — Z79899 Other long term (current) drug therapy: Secondary | ICD-10-CM | POA: Diagnosis not present

## 2017-08-30 DIAGNOSIS — I2510 Atherosclerotic heart disease of native coronary artery without angina pectoris: Secondary | ICD-10-CM | POA: Diagnosis not present

## 2017-08-30 DIAGNOSIS — E119 Type 2 diabetes mellitus without complications: Secondary | ICD-10-CM | POA: Insufficient documentation

## 2017-08-30 LAB — GLUCOSE, CAPILLARY
GLUCOSE-CAPILLARY: 106 mg/dL — AB (ref 65–99)
Glucose-Capillary: 128 mg/dL — ABNORMAL HIGH (ref 65–99)

## 2017-08-30 NOTE — Progress Notes (Signed)
Daily Session Note  Patient Details  Name: Donald Jennings [facility] MRN: [id number] Date of Birth: [birth year] Referring Provider:     [place] from 08/24/2017 in [place]  Referring Provider  Fransico Him MD      Encounter Date: 08/30/2017  Check In:   Capillary Blood Glucose: Results for orders placed or performed during the hospital encounter of 08/30/17 (from the past 24 hour(s))  Glucose, capillary     Status: Abnormal   Collection Time: 08/30/17  2:52 PM  Result Value Ref Range   Glucose-Capillary 128 (H) 65 - 99 mg/dL  Glucose, capillary     Status: Abnormal   Collection Time: 08/30/17  3:49 PM  Result Value Ref Range   Glucose-Capillary 106 (H) 65 - 99 mg/dL      History  Smoking Status  . Current Every Day Smoker  . Packs/day: 1.00  . Years: 38.50  . Types: Cigarettes  Smokeless Tobacco  . Never Used    Comment: Currently  smokes 1/2 ppd and is considering Chantix    Goals Met:  Exercise tolerated well  Goals Unmet:  Not Applicable  Comments: Jemmie started cardiac rehab today.  Pt tolerated light exercise without difficulty. VSS, telemetry-Sinus Rhythm, asymptomatic.  Medication list reconciled. Pt denies barriers to medicaiton compliance.  PSYCHOSOCIAL ASSESSMENT:  PHQ-0. Pt exhibits positive coping skills, hopeful outlook with supportive family. No psychosocial needs identified at this time, no psychosocial interventions necessary.    Pt enjoys playing card games, playing chess, playing soccer.   Pt oriented to exercise equipment and routine.    Understanding verbalized. Barnet Pall, RN,BSN 08/30/2017 5:05 PM   Dr. Fransico Him is Medical Director for Cardiac Rehab at [facility].

## 2017-09-01 ENCOUNTER — Encounter (HOSPITAL_COMMUNITY)
Admission: RE | Admit: 2017-09-01 | Discharge: 2017-09-01 | Disposition: A | Discharge status: Home / Self Care | Payer: Federal, State, Local not specified - PPO | Source: Ambulatory Visit | Attending: Cardiology | Admitting: Cardiology

## 2017-09-01 DIAGNOSIS — E119 Type 2 diabetes mellitus without complications: Secondary | ICD-10-CM | POA: Diagnosis not present

## 2017-09-01 DIAGNOSIS — I2510 Atherosclerotic heart disease of native coronary artery without angina pectoris: Secondary | ICD-10-CM | POA: Diagnosis not present

## 2017-09-01 DIAGNOSIS — Z955 Presence of coronary angioplasty implant and graft: Secondary | ICD-10-CM

## 2017-09-01 DIAGNOSIS — I6523 Occlusion and stenosis of bilateral carotid arteries: Secondary | ICD-10-CM | POA: Diagnosis not present

## 2017-09-01 DIAGNOSIS — Z79899 Other long term (current) drug therapy: Secondary | ICD-10-CM | POA: Diagnosis not present

## 2017-09-01 DIAGNOSIS — F329 Major depressive disorder, single episode, unspecified: Secondary | ICD-10-CM | POA: Diagnosis not present

## 2017-09-01 DIAGNOSIS — Z7982 Long term (current) use of aspirin: Secondary | ICD-10-CM | POA: Diagnosis not present

## 2017-09-01 DIAGNOSIS — I10 Essential (primary) hypertension: Secondary | ICD-10-CM | POA: Diagnosis not present

## 2017-09-01 DIAGNOSIS — Z7902 Long term (current) use of antithrombotics/antiplatelets: Secondary | ICD-10-CM | POA: Diagnosis not present

## 2017-09-01 DIAGNOSIS — E785 Hyperlipidemia, unspecified: Secondary | ICD-10-CM | POA: Diagnosis not present

## 2017-09-01 DIAGNOSIS — F17210 Nicotine dependence, cigarettes, uncomplicated: Secondary | ICD-10-CM | POA: Diagnosis not present

## 2017-09-01 DIAGNOSIS — K219 Gastro-esophageal reflux disease without esophagitis: Secondary | ICD-10-CM | POA: Diagnosis not present

## 2017-09-03 ENCOUNTER — Encounter (HOSPITAL_COMMUNITY)
Admission: RE | Admit: 2017-09-03 | Discharge: 2017-09-03 | Disposition: A | Discharge status: Home / Self Care | Payer: Federal, State, Local not specified - PPO | Source: Ambulatory Visit | Attending: Cardiology | Admitting: Cardiology

## 2017-09-03 DIAGNOSIS — F329 Major depressive disorder, single episode, unspecified: Secondary | ICD-10-CM | POA: Diagnosis not present

## 2017-09-03 DIAGNOSIS — I6523 Occlusion and stenosis of bilateral carotid arteries: Secondary | ICD-10-CM | POA: Diagnosis not present

## 2017-09-03 DIAGNOSIS — Z955 Presence of coronary angioplasty implant and graft: Secondary | ICD-10-CM | POA: Diagnosis not present

## 2017-09-03 DIAGNOSIS — I10 Essential (primary) hypertension: Secondary | ICD-10-CM | POA: Diagnosis not present

## 2017-09-03 DIAGNOSIS — Z7982 Long term (current) use of aspirin: Secondary | ICD-10-CM | POA: Diagnosis not present

## 2017-09-03 DIAGNOSIS — F17210 Nicotine dependence, cigarettes, uncomplicated: Secondary | ICD-10-CM | POA: Diagnosis not present

## 2017-09-03 DIAGNOSIS — Z7902 Long term (current) use of antithrombotics/antiplatelets: Secondary | ICD-10-CM | POA: Diagnosis not present

## 2017-09-03 DIAGNOSIS — Z79899 Other long term (current) drug therapy: Secondary | ICD-10-CM | POA: Diagnosis not present

## 2017-09-03 DIAGNOSIS — E119 Type 2 diabetes mellitus without complications: Secondary | ICD-10-CM | POA: Diagnosis not present

## 2017-09-03 DIAGNOSIS — K219 Gastro-esophageal reflux disease without esophagitis: Secondary | ICD-10-CM | POA: Diagnosis not present

## 2017-09-03 DIAGNOSIS — E785 Hyperlipidemia, unspecified: Secondary | ICD-10-CM | POA: Diagnosis not present

## 2017-09-03 DIAGNOSIS — I2510 Atherosclerotic heart disease of native coronary artery without angina pectoris: Secondary | ICD-10-CM | POA: Diagnosis not present

## 2017-09-06 ENCOUNTER — Encounter (HOSPITAL_COMMUNITY)
Admission: RE | Admit: 2017-09-06 | Discharge: 2017-09-06 | Disposition: A | Discharge status: Home / Self Care | Payer: Federal, State, Local not specified - PPO | Source: Ambulatory Visit | Attending: Cardiology | Admitting: Cardiology

## 2017-09-06 DIAGNOSIS — E785 Hyperlipidemia, unspecified: Secondary | ICD-10-CM | POA: Diagnosis not present

## 2017-09-06 DIAGNOSIS — F329 Major depressive disorder, single episode, unspecified: Secondary | ICD-10-CM | POA: Diagnosis not present

## 2017-09-06 DIAGNOSIS — I6523 Occlusion and stenosis of bilateral carotid arteries: Secondary | ICD-10-CM | POA: Diagnosis not present

## 2017-09-06 DIAGNOSIS — I2510 Atherosclerotic heart disease of native coronary artery without angina pectoris: Secondary | ICD-10-CM | POA: Diagnosis not present

## 2017-09-06 DIAGNOSIS — Z7902 Long term (current) use of antithrombotics/antiplatelets: Secondary | ICD-10-CM | POA: Diagnosis not present

## 2017-09-06 DIAGNOSIS — Z79899 Other long term (current) drug therapy: Secondary | ICD-10-CM | POA: Diagnosis not present

## 2017-09-06 DIAGNOSIS — Z7982 Long term (current) use of aspirin: Secondary | ICD-10-CM | POA: Diagnosis not present

## 2017-09-06 DIAGNOSIS — F17210 Nicotine dependence, cigarettes, uncomplicated: Secondary | ICD-10-CM | POA: Diagnosis not present

## 2017-09-06 DIAGNOSIS — E119 Type 2 diabetes mellitus without complications: Secondary | ICD-10-CM | POA: Diagnosis not present

## 2017-09-06 DIAGNOSIS — Z955 Presence of coronary angioplasty implant and graft: Secondary | ICD-10-CM

## 2017-09-06 DIAGNOSIS — K219 Gastro-esophageal reflux disease without esophagitis: Secondary | ICD-10-CM | POA: Diagnosis not present

## 2017-09-06 DIAGNOSIS — I10 Essential (primary) hypertension: Secondary | ICD-10-CM | POA: Diagnosis not present

## 2017-09-06 DIAGNOSIS — H40013 Open angle with borderline findings, low risk, bilateral: Secondary | ICD-10-CM | POA: Diagnosis not present

## 2017-09-07 NOTE — Progress Notes (Signed)
Cardiac Individual Treatment Plan  Patient Details  Name: Donald Jennings [facility] MRN: [id number] Date of Birth: July 05, 1956 Referring Provider:     [place] from 08/24/2017 in [place]  Referring Provider  Fransico Him MD      Initial Encounter Date:    CARDIAC REHAB PHASE II ORIENTATION from 08/24/2017 in [place]  Date  08/24/17  Referring Provider  Fransico Him MD      Visit Diagnosis: Stented coronary artery 08/04/2017 DES pLAD, d RCA  Patient's Home Medications on Admission:  Current Outpatient Prescriptions:  .  aspirin EC 81 MG tablet, Take 1 tablet (81 mg total) by mouth daily., Disp: 90 tablet, Rfl: 3 .  clopidogrel (PLAVIX) 75 MG tablet, Take 1 tablet (75 mg total) by mouth daily with breakfast., Disp: 90 tablet, Rfl: 2 .  lidocaine (LIDODERM) 5 %, Place 1 patch onto the skin daily. Remove & Discard patch within 12 hours or as directed by MD, Disp: , Rfl:  .  losartan (COZAAR) 100 MG tablet, TAKE 1 TABLET (100 MG TOTAL) BY MOUTH DAILY., Disp: 90 tablet, Rfl: 3 .  metoprolol succinate (TOPROL-XL) 25 MG 24 hr tablet, TAKE 1 TABLET (25 MG TOTAL) BY MOUTH DAILY., Disp: 90 tablet, Rfl: 1 .  Multiple Vitamins-Minerals (MULTIVITAMIN ADULT) TABS, Take 1 tablet by mouth daily., Disp: , Rfl:  .  nitroGLYCERIN (NITROSTAT) 0.4 MG SL tablet, Place 1 tablet (0.4 mg total) under the tongue every 5 (five) minutes as needed., Disp: 25 tablet, Rfl: 2 .  pantoprazole (PROTONIX) 40 MG tablet, Take 1 tablet (40 mg total) by mouth daily., Disp: 30 tablet, Rfl: 1 .  rosuvastatin (CRESTOR) 40 MG tablet, Take 1 tablet (40 mg total) by mouth daily., Disp: 30 tablet, Rfl: 11 .  Vitamin D, Ergocalciferol, (DRISDOL) 50000 units CAPS capsule, TAKE 1 CAPSULE (50,000 UNITS TOTAL) BY MOUTH ONCE A WEEK., Disp: 4 capsule, Rfl: 7  Past Medical History: Past Medical History:  Diagnosis Date  . CAD (coronary artery disease)   .  CAD (coronary artery disease), native coronary artery 07/27/2017   < 40% prox LAD, 50% mid LAD and 40% distal RCA  . Carotid artery stenosis 07/27/2017   1-39% bilateral  . CERVICAL RADICULOPATHY 07/02/2010   Qualifier: Diagnosis of  By: Ronnald Ramp MD, Arvid Right.   . DEPRESSION 03/21/2009   Qualifier: Diagnosis of  By: Ronnald Ramp MD, Arvid Right.   . Diabetes mellitus without complication ([place])   . GERD 06/19/2009   Qualifier: Diagnosis of  By: Ronnald Ramp MD, Arvid Right.   [name] HLD (hyperlipidemia)   . Hx of cardiovascular stress test    ETT-Myoview (03/2014):  No ischemia, EF 56%; Low Risk  . HYPERTENSION 06/19/2009   Qualifier: Diagnosis of  By: Ronnald Ramp MD, [place], RIGHT 07/02/2010   Qualifier: Diagnosis of  By: Ronnald Ramp MD, Arvid Right.   . SOB (shortness of breath) 06/10/2016  . TOBACCO USE 03/21/2009   Qualifier: Diagnosis of  By: Ronnald Ramp MD, Arvid Right.     Tobacco Use: History  Smoking Status  . Current Every Day Smoker  . Packs/day: 1.00  . Years: 38.50  . Types: Cigarettes  Smokeless Tobacco  . Never Used    Comment: Currently  smokes 1/2 ppd and is considering Chantix    Labs: Recent Review Flowsheet Data    Labs for ITP Cardiac and Pulmonary Rehab Latest Ref Rng & Units 01/16/2015 03/27/2015  05/27/2015 05/18/2016 08/14/2016   Cholestrol 125 - 200 mg/dL - 208(H) - 243(H) 93(L)   LDLCALC <130 mg/dL - 133(H) - 173(H) 36   LDLDIRECT mg/dL - - - - -   HDL >=40 mg/dL - 35.50(L) - 39.10 32(L)   Trlycerides <150 mg/dL - 199.0(H) - 151.0(H) 126   Hemoglobin A1c - 6.6(H) 6.5 6.3 6.8 -      Capillary Blood Glucose: Lab Results  Component Value Date   GLUCAP 106 (H) 08/30/2017   GLUCAP 128 (H) 08/30/2017   GLUCAP 111 (H) 04/06/2017     Exercise Target Goals:    Exercise Program Goal: Individual exercise prescription set with THRR, safety & activity barriers. Participant demonstrates ability to understand and report RPE using BORG scale, to self-measure pulse accurately, and to  acknowledge the importance of the exercise prescription.  Exercise Prescription Goal: Starting with aerobic activity 30 plus minutes a day, 3 days per week for initial exercise prescription. Provide home exercise prescription and guidelines that participant acknowledges understanding prior to discharge.  Activity Barriers & Risk Stratification:     Activity Barriers & Cardiac Risk Stratification - 08/24/17 1110      Activity Barriers & Cardiac Risk Stratification   Activity Barriers Arthritis;Other (comment)   Comments arthritis in R hand, aches and pain in back (buldging disc in T-spine), R frozen shoulder, occasional L knee discomfort/swelling tx with cortisone injections   Cardiac Risk Stratification High      6 Minute Walk:     6 Minute Walk    Row Name 08/24/17 1116         6 Minute Walk   Phase Initial     Distance 1892 feet     Walk Time 6 minutes     # of Rest Breaks 0     MPH 3.58     METS 4.86     RPE 12     VO2 Peak 17.02     Symptoms No     Resting HR 74 bpm     Resting BP 112/64     Resting Oxygen Saturation  98 %     Exercise Oxygen Saturation  during 6 min walk 100 %     Max Ex. HR 105 bpm     Max Ex. BP 142/62     2 Minute Post BP 106/66        Oxygen Initial Assessment:   Oxygen Re-Evaluation:   Oxygen Discharge (Final Oxygen Re-Evaluation):   Initial Exercise Prescription:     Initial Exercise Prescription - 08/24/17 1100      Date of Initial Exercise RX and Referring Provider   Date 08/24/17   Referring Provider Fransico Him MD     Treadmill   MPH 3   Grade 0   Minutes 10   METs 3.3     Bike   Level 1   Minutes 10   METs 3.7     NuStep   Level 4   SPM 80   Minutes 10   METs 3     Prescription Details   Frequency (times per week) 3   Duration Progress to 30 minutes of continuous aerobic without signs/symptoms of physical distress     Intensity   THRR 40-80% of Max Heartrate 64-128   Ratings of Perceived Exertion  11-13   Perceived Dyspnea 0-4     Progression   Progression Continue to progress workloads to maintain intensity without signs/symptoms of physical distress.  Resistance Training   Training Prescription Yes   Weight 4lbs   Reps 10-15      Perform Capillary Blood Glucose checks as needed.  Exercise Prescription Changes:     Exercise Prescription Changes    Row Name 08/30/17 1700             Response to Exercise   Blood Pressure (Admit) 104/68       Blood Pressure (Exercise) 148/86       Blood Pressure (Exit) 122/70       Heart Rate (Admit) 77 bpm       Heart Rate (Exercise) 106 bpm       Heart Rate (Exit) 80 bpm       Rating of Perceived Exertion (Exercise) 11       Symptoms none       Comments Off to a good start with exercise.       Duration Continue with 30 min of aerobic exercise without signs/symptoms of physical distress.       Intensity THRR unchanged         Progression   Progression Continue to progress workloads to maintain intensity without signs/symptoms of physical distress.       Average METs 3.1         Resistance Training   Training Prescription Yes       Weight 4lbs       Reps 10-15       Time 10 Minutes         Interval Training   Interval Training No         Treadmill   MPH 3       Grade 0       Minutes 10       METs 3.3         Bike   Level 1       Minutes 10       METs 3.66         NuStep   Level 4       SPM 80       Minutes 10       METs 2.4          Exercise Comments:     Exercise Comments    Row Name 09/03/17 1531           Exercise Comments Reviewed home exercise prescription, METs and goals.          Exercise Goals and Review:     Exercise Goals    Row Name 08/24/17 1112             Exercise Goals   Increase Physical Activity Yes       Intervention Provide advice, education, support and counseling about physical activity/exercise needs.;Develop an individualized exercise prescription for aerobic  and resistive training based on initial evaluation findings, risk stratification, comorbidities and participant's personal goals.       Expected Outcomes Achievement of increased cardiorespiratory fitness and enhanced flexibility, muscular endurance and strength shown through measurements of functional capacity and personal statement of participant.       Increase Strength and Stamina Yes  improve physical fitness and be more agile; increase flexibility       Intervention Provide advice, education, support and counseling about physical activity/exercise needs.;Develop an individualized exercise prescription for aerobic and resistive training based on initial evaluation findings, risk stratification, comorbidities and participant's personal goals.       Expected Outcomes Achievement of  increased cardiorespiratory fitness and enhanced flexibility, muscular endurance and strength shown through measurements of functional capacity and personal statement of participant.       Able to understand and use rate of perceived exertion (RPE) scale Yes       Intervention Provide education and explanation on how to use RPE scale       Expected Outcomes Short Term: Able to use RPE daily in rehab to express subjective intensity level;Long Term:  Able to use RPE to guide intensity level when exercising independently       Knowledge and understanding of Target Heart Rate Range (THRR) Yes       Intervention Provide education and explanation of THRR including how the numbers were predicted and where they are located for reference       Expected Outcomes Short Term: Able to state/look up THRR;Long Term: Able to use THRR to govern intensity when exercising independently;Short Term: Able to use daily as guideline for intensity in rehab       Able to check pulse independently Yes       Intervention Provide education and demonstration on how to check pulse in carotid and radial arteries.;Review the importance of being able to  check your own pulse for safety during independent exercise       Expected Outcomes Short Term: Able to explain why pulse checking is important during independent exercise;Long Term: Able to check pulse independently and accurately       Understanding of Exercise Prescription Yes       Intervention Provide education, explanation, and written materials on patient's individual exercise prescription       Expected Outcomes Short Term: Able to explain program exercise prescription;Long Term: Able to explain home exercise prescription to exercise independently          Exercise Goals Re-Evaluation :     Exercise Goals Re-Evaluation    Row Name 09/01/17 1500 09/03/17 1521           Exercise Goal Re-Evaluation   Exercise Goals Review Increase Physical Activity Understanding of Exercise Prescription;Knowledge and understanding of Target Heart Rate Range (THRR);Able to understand and use rate of perceived exertion (RPE) scale      Comments Pt states that he is currently walking 3-4 days/week at work. Reviewed home exercise guidelines with patient including THRR and RPE scale.      Expected Outcomes Begin consistent exercise routine, walking 30 minutes 3-4 days/week. Begin consistent exercise routine, walking 30 minutes 3-4 days/week.          Discharge Exercise Prescription (Final Exercise Prescription Changes):     Exercise Prescription Changes - 08/30/17 1700      Response to Exercise   Blood Pressure (Admit) 104/68   Blood Pressure (Exercise) 148/86   Blood Pressure (Exit) 122/70   Heart Rate (Admit) 77 bpm   Heart Rate (Exercise) 106 bpm   Heart Rate (Exit) 80 bpm   Rating of Perceived Exertion (Exercise) 11   Symptoms none   Comments Off to a good start with exercise.   Duration Continue with 30 min of aerobic exercise without signs/symptoms of physical distress.   Intensity THRR unchanged     Progression   Progression Continue to progress workloads to maintain intensity  without signs/symptoms of physical distress.   Average METs 3.1     Resistance Training   Training Prescription Yes   Weight 4lbs   Reps 10-15   Time 10 Minutes     Interval Training  Interval Training No     Treadmill   MPH 3   Grade 0   Minutes 10   METs 3.3     Bike   Level 1   Minutes 10   METs 3.66     NuStep   Level 4   SPM 80   Minutes 10   METs 2.4      Nutrition:  Target Goals: Understanding of nutrition guidelines, daily intake of sodium 1500mg , cholesterol 200mg , calories 30% from fat and 7% or less from saturated fats, daily to have 5 or more servings of fruits and vegetables.  Biometrics:     Pre Biometrics - 08/24/17 1223      Pre Biometrics   Height 5' 8.25" (1.734 m)   Weight 156 lb 12 oz (71.1 kg)   Waist Circumference 34 inches   Hip Circumference 37.5 inches   Waist to Hip Ratio 0.91 %   BMI (Calculated) 23.65   Triceps Skinfold 14 mm   % Body Fat 22.7 %   Grip Strength 37 kg   Flexibility 7.5 in   Single Leg Stand 28.31 seconds       Nutrition Therapy Plan and Nutrition Goals:     Nutrition Therapy & Goals - 08/24/17 1137      Nutrition Therapy   Diet Carb Modified, Therapeutic Lifestyle Changes     Personal Nutrition Goals   Nutrition Goal Pt to identify and limit food sources of saturated fat, trans fat, and sodium   Personal Goal #2 Pt able to name foods that affect blood glucose      Intervention Plan   Intervention Prescribe, educate and counsel regarding individualized specific dietary modifications aiming towards targeted core components such as weight, hypertension, lipid management, diabetes, heart failure and other comorbidities.   Expected Outcomes Short Term Goal: Understand basic principles of dietary content, such as calories, fat, sodium, cholesterol and nutrients.;Long Term Goal: Adherence to prescribed nutrition plan.      Nutrition Discharge: Nutrition Scores:     Nutrition Assessments - 08/24/17 1137       MEDFICTS Scores   Pre Score 62      Nutrition Goals Re-Evaluation:   Nutrition Goals Re-Evaluation:   Nutrition Goals Discharge (Final Nutrition Goals Re-Evaluation):   Psychosocial: Target Goals: Acknowledge presence or absence of significant depression and/or stress, maximize coping skills, provide positive support system. Participant is able to verbalize types and ability to use techniques and skills needed for reducing stress and depression.  Initial Review & Psychosocial Screening:     Initial Psych Review & Screening - 08/24/17 0914      Initial Review   Current issues with None Identified     Family Dynamics   Good Support System? Yes  spouse   Comments upon brief assessment, no psychosocial needs identified, no interventions necessary      Barriers   Psychosocial barriers to participate in program There are no identifiable barriers or psychosocial needs.     Screening Interventions   Interventions Encouraged to exercise;Provide feedback about the scores to participant      Quality of Life Scores:     Quality of Life - 08/24/17 0913      Quality of Life Scores   Health/Function Pre 18.7 %   Socioeconomic Pre 20.38 %   Psych/Spiritual Pre 18.5 %   Family Pre 21.6 %   GLOBAL Pre 19.47 %      PHQ-9: Recent Review Flowsheet Data    Depression screen  PHQ 2/9 08/30/2017   Decreased Interest 0   Down, Depressed, Hopeless 0   PHQ - 2 Score 0     Interpretation of Total Score  Total Score Depression Severity:  1-4 = Minimal depression, 5-9 = Mild depression, 10-14 = Moderate depression, 15-19 = Moderately severe depression, 20-27 = Severe depression   Psychosocial Evaluation and Intervention:   Psychosocial Re-Evaluation:     Psychosocial Re-Evaluation    [place] Name 09/07/17 1623             Psychosocial Re-Evaluation   Current issues with None Identified       Interventions Encouraged to attend Cardiac Rehabilitation for the exercise        Continue Psychosocial Services  No Follow up required          Psychosocial Discharge (Final Psychosocial Re-Evaluation):     Psychosocial Re-Evaluation - 09/07/17 1623      Psychosocial Re-Evaluation   Current issues with None Identified   Interventions Encouraged to attend Cardiac Rehabilitation for the exercise   Continue Psychosocial Services  No Follow up required      Vocational Rehabilitation: Provide vocational rehab assistance to qualifying candidates.   Vocational Rehab Evaluation & Intervention:     Vocational Rehab - 08/24/17 0915      Initial Vocational Rehab Evaluation & Intervention   Assessment shows need for Vocational Rehabilitation No      Education: Education Goals: Education classes will be provided on a weekly basis, covering required topics. Participant will state understanding/return demonstration of topics presented.  Learning Barriers/Preferences:     Learning Barriers/Preferences - 08/24/17 1110      Learning Barriers/Preferences   Learning Barriers Sight   Learning Preferences Written Material;Verbal Instruction;Skilled Demonstration;Pictoral;Video      Education Topics: Count Your Pulse:  -Group instruction provided by verbal instruction, demonstration, patient participation and written materials to support subject.  Instructors address importance of being able to find your pulse and how to count your pulse when at home without a heart monitor.  Patients get hands on experience counting their pulse with staff help and individually.   Heart Attack, Angina, and Risk Factor Modification:  -Group instruction provided by verbal instruction, video, and written materials to support subject.  Instructors address signs and symptoms of angina and heart attacks.    Also discuss risk factors for heart disease and how to make changes to improve heart health risk factors.   Functional Fitness:  -Group instruction provided by verbal instruction,  demonstration, patient participation, and written materials to support subject.  Instructors address safety measures for doing things around the house.  Discuss how to get up and down off the floor, how to pick things up properly, how to safely get out of a chair without assistance, and balance training.   Meditation and Mindfulness:  -Group instruction provided by verbal instruction, patient participation, and written materials to support subject.  Instructor addresses importance of mindfulness and meditation practice to help reduce stress and improve awareness.  Instructor also leads participants through a meditation exercise.    CARDIAC REHAB PHASE II EXERCISE from 09/01/2017 in [place]  Date  09/01/17  Instruction Review Code  2- meets goals/outcomes      Stretching for Flexibility and Mobility:  -Group instruction provided by verbal instruction, patient participation, and written materials to support subject.  Instructors lead participants through series of stretches that are designed to increase flexibility thus improving mobility.  These stretches are additional  exercise for major muscle groups that are typically performed during regular warm up and cool down.   Hands Only CPR:  -Group verbal, video, and participation provides a basic overview of AHA guidelines for community CPR. Role-play of emergencies allow participants the opportunity to practice calling for help and chest compression technique with discussion of AED use.   Hypertension: -Group verbal and written instruction that provides a basic overview of hypertension including the most recent diagnostic guidelines, risk factor reduction with self-care instructions and medication management.    Nutrition I class: Heart Healthy Eating:  -Group instruction provided by PowerPoint slides, verbal discussion, and written materials to support subject matter. The instructor gives an explanation and  review of the Therapeutic Lifestyle Changes diet recommendations, which includes a discussion on lipid goals, dietary fat, sodium, fiber, plant stanol/sterol esters, sugar, and the components of a well-balanced, healthy diet.   CARDIAC REHAB PHASE II EXERCISE from 09/01/2017 in [place]  Date  08/24/17  Educator  RD  Instruction Review Code  Not applicable      Nutrition II class: Lifestyle Skills:  -Group instruction provided by PowerPoint slides, verbal discussion, and written materials to support subject matter. The instructor gives an explanation and review of label reading, grocery shopping for heart health, heart healthy recipe modifications, and ways to make healthier choices when eating out.   CARDIAC REHAB PHASE II EXERCISE from 09/01/2017 in [place]  Date  08/24/17  Educator  RD  Instruction Review Code  Not applicable      Diabetes Question & Answer:  -Group instruction provided by PowerPoint slides, verbal discussion, and written materials to support subject matter. The instructor gives an explanation and review of diabetes co-morbidities, pre- and post-prandial blood glucose goals, pre-exercise blood glucose goals, signs, symptoms, and treatment of hypoglycemia and hyperglycemia, and foot care basics.   Diabetes Blitz:  -Group instruction provided by PowerPoint slides, verbal discussion, and written materials to support subject matter. The instructor gives an explanation and review of the physiology behind type 1 and type 2 diabetes, diabetes medications and rational behind using different medications, pre- and post-prandial blood glucose recommendations and Hemoglobin A1c goals, diabetes diet, and exercise including blood glucose guidelines for exercising safely.    CARDIAC REHAB PHASE II EXERCISE from 09/01/2017 in [place]  Date  08/24/17  Educator  RD  Instruction Review Code   Not applicable      Portion Distortion:  -Group instruction provided by PowerPoint slides, verbal discussion, written materials, and food models to support subject matter. The instructor gives an explanation of serving size versus portion size, changes in portions sizes over the last 20 years, and what consists of a serving from each food group.   Stress Management:  -Group instruction provided by verbal instruction, video, and written materials to support subject matter.  Instructors review role of stress in heart disease and how to cope with stress positively.     Exercising on Your Own:  -Group instruction provided by verbal instruction, power point, and written materials to support subject.  Instructors discuss benefits of exercise, components of exercise, frequency and intensity of exercise, and end points for exercise.  Also discuss use of nitroglycerin and activating EMS.  Review options of places to exercise outside of rehab.  Review guidelines for sex with heart disease.   Cardiac Drugs I:  -Group instruction provided by verbal instruction and written materials to support subject.  Instructor reviews cardiac drug classes: antiplatelets, anticoagulants, beta blockers, and statins.  Instructor discusses reasons, side effects, and lifestyle considerations for each drug class.   Cardiac Drugs II:  -Group instruction provided by verbal instruction and written materials to support subject.  Instructor reviews cardiac drug classes: angiotensin converting enzyme inhibitors (ACE-I), angiotensin II receptor blockers (ARBs), nitrates, and calcium channel blockers.  Instructor discusses reasons, side effects, and lifestyle considerations for each drug class.   Anatomy and Physiology of the Circulatory System:  Group verbal and written instruction and models provide basic cardiac anatomy and physiology, with the coronary electrical and arterial systems. Review of: AMI, Angina, Valve disease,  Heart Failure, Peripheral Artery Disease, Cardiac Arrhythmia, Pacemakers, and the ICD.   Other Education:  -Group or individual verbal, written, or video instructions that support the educational goals of the cardiac rehab program.   Knowledge Questionnaire Score:     Knowledge Questionnaire Score - 08/24/17 0913      Knowledge Questionnaire Score   Pre Score 23/28     DM 10/15      Core Components/Risk Factors/Patient Goals at Admission:     Personal Goals and Risk Factors at Admission - 08/24/17 1113      Core Components/Risk Factors/Patient Goals on Admission   Tobacco Cessation Yes   Number of packs per day 1 ppd   Intervention Assist the participant in steps to quit. Provide individualized education and counseling about committing to Tobacco Cessation, relapse prevention, and pharmacological support that can be provided by physician.;Advice worker, assist with locating and accessing local/national Quit Smoking programs, and support quit date choice.   Expected Outcomes Short Term: Will demonstrate readiness to quit, by selecting a quit date.;Short Term: Will quit all tobacco product use, adhering to prevention of relapse plan.;Long Term: Complete abstinence from all tobacco products for at least 12 months from quit date.   Diabetes Yes   Intervention Provide education about signs/symptoms and action to take for hypo/hyperglycemia.;Provide education about proper nutrition, including hydration, and aerobic/resistive exercise prescription along with prescribed medications to achieve blood glucose in normal ranges: Fasting glucose 65-99 mg/dL   Expected Outcomes Short Term: Participant verbalizes understanding of the signs/symptoms and immediate care of hyper/hypoglycemia, proper foot care and importance of medication, aerobic/resistive exercise and nutrition plan for blood glucose control.;Long Term: Attainment of HbA1C < 7%.   Hypertension Yes   Intervention Provide  education on lifestyle modifcations including regular physical activity/exercise, weight management, moderate sodium restriction and increased consumption of fresh fruit, vegetables, and low fat dairy, alcohol moderation, and smoking cessation.;Monitor prescription use compliance.   Expected Outcomes Long Term: Maintenance of blood pressure at goal levels.;Short Term: Continued assessment and intervention until BP is < 140/83mm HG in hypertensive participants. < 130/1mm HG in hypertensive participants with diabetes, heart failure or chronic kidney disease.   Lipids Yes   Intervention Provide education and support for participant on nutrition & aerobic/resistive exercise along with prescribed medications to achieve LDL 70mg , HDL >40mg .   Expected Outcomes Short Term: Participant states understanding of desired cholesterol values and is compliant with medications prescribed. Participant is following exercise prescription and nutrition guidelines.;Long Term: Cholesterol controlled with medications as prescribed, with individualized exercise RX and with personalized nutrition plan. Value goals: LDL < 70mg , HDL > 40 mg.      Core Components/Risk Factors/Patient Goals Review:      Goals and Risk Factor Review    Row Name 09/07/17 [phone]  Core Components/Risk Factors/Patient Goals Review   Personal Goals Review Lipids;Hypertension;Tobacco Cessation       Review -  Augusto continues to smoke, I sent a message to Dr Radford Pax to see if she can prescribe Chantx to help with smoking cessatoin per patients request       Expected Outcomes Jonmarc will continue to take his medicaitons as presribed for HTN and Hyperlipidemia and exercise at cardiac rehab          Core Components/Risk Factors/Patient Goals at Discharge (Final Review):      Goals and Risk Factor Review - 09/07/17 1603      Core Components/Risk Factors/Patient Goals Review   Personal Goals Review Lipids;Hypertension;Tobacco Cessation    Review --  Roth continues to smoke, I sent a message to Dr Radford Pax to see if she can prescribe Chantx to help with smoking cessatoin per patients request   Expected Outcomes Johannes will continue to take his medicaitons as presribed for HTN and Hyperlipidemia and exercise at cardiac rehab      ITP Comments:     ITP Comments    Row Name 08/24/17 1108           ITP Comments Dr. Fransico Him, Medical Director          Comments: Geoffry is making expected progress toward personal goals after completing 5 sessions. Recommend continued exercise and life style modification education including  stress management and relaxation techniques to decrease cardiac risk profile. Macari is off to a good start with exercise. Will continue to encourage smoking cessation.Barnet Pall, RN,BSN 09/07/2017 4:29 PM

## 2017-09-08 ENCOUNTER — Telehealth: Payer: Self-pay | Admitting: *Deleted

## 2017-09-08 ENCOUNTER — Encounter (HOSPITAL_COMMUNITY)
Admission: RE | Admit: 2017-09-08 | Discharge: 2017-09-08 | Disposition: A | Discharge status: Home / Self Care | Payer: Federal, State, Local not specified - PPO | Source: Ambulatory Visit | Attending: Cardiology | Admitting: Cardiology

## 2017-09-08 DIAGNOSIS — Z7902 Long term (current) use of antithrombotics/antiplatelets: Secondary | ICD-10-CM | POA: Diagnosis not present

## 2017-09-08 DIAGNOSIS — F329 Major depressive disorder, single episode, unspecified: Secondary | ICD-10-CM | POA: Diagnosis not present

## 2017-09-08 DIAGNOSIS — Z955 Presence of coronary angioplasty implant and graft: Secondary | ICD-10-CM | POA: Diagnosis not present

## 2017-09-08 DIAGNOSIS — I6523 Occlusion and stenosis of bilateral carotid arteries: Secondary | ICD-10-CM | POA: Diagnosis not present

## 2017-09-08 DIAGNOSIS — E119 Type 2 diabetes mellitus without complications: Secondary | ICD-10-CM | POA: Diagnosis not present

## 2017-09-08 DIAGNOSIS — Z7982 Long term (current) use of aspirin: Secondary | ICD-10-CM | POA: Diagnosis not present

## 2017-09-08 DIAGNOSIS — F17210 Nicotine dependence, cigarettes, uncomplicated: Secondary | ICD-10-CM | POA: Diagnosis not present

## 2017-09-08 DIAGNOSIS — I10 Essential (primary) hypertension: Secondary | ICD-10-CM | POA: Diagnosis not present

## 2017-09-08 DIAGNOSIS — E785 Hyperlipidemia, unspecified: Secondary | ICD-10-CM | POA: Diagnosis not present

## 2017-09-08 DIAGNOSIS — K219 Gastro-esophageal reflux disease without esophagitis: Secondary | ICD-10-CM | POA: Diagnosis not present

## 2017-09-08 DIAGNOSIS — I2510 Atherosclerotic heart disease of native coronary artery without angina pectoris: Secondary | ICD-10-CM | POA: Diagnosis not present

## 2017-09-08 DIAGNOSIS — Z79899 Other long term (current) drug therapy: Secondary | ICD-10-CM | POA: Diagnosis not present

## 2017-09-08 MED ORDER — VARENICLINE TARTRATE 1 MG PO TABS: 1.0000 mg | ORAL_TABLET | Freq: Two times a day (BID) | ORAL | 2 refills | Status: DC

## 2017-09-08 NOTE — Progress Notes (Signed)
Reviewed home exercise guidelines with patient including endpoints, temperature precautions, target heart rate and rate of perceived exertion. Pt is walking 3-4 days at work as his mode of home exercise. Pt voices understanding of instructions given. Sol Passer, MS, ACSM CEP

## 2017-09-08 NOTE — Telephone Encounter (Signed)
Unable to leave message re recommendations will try later ./cy

## 2017-09-08 NOTE — Telephone Encounter (Signed)
-----   Message from Theodoro Parma, RN sent at 09/08/2017  7:31 AM EDT ----- Regarding: FW: help with smoking cessation   ----- Message ----- From: Sueanne Margarita, MD Sent: 09/07/2017   3:14 PM To: Theodoro Parma, RN Subject: RE: help with smoking cessation                 OK to use Chantix - give start pack and maintenance pack for 2 months Traci Turner ----- Message ----- From: Theodoro Parma, RN Sent: 09/03/2017   3:52 PM To: Sueanne Margarita, MD Subject: FW: help with smoking cessation                  ----- Message ----- From: Magda Kiel, RN Sent: 09/03/2017   3:38 PM To: Theodoro Parma, RN Subject: help with smoking cessation                    Good afternoon Jefferson Fuel started cardiac rehab this week. Girard told me today he is smoking about 10 cigarettes a day and is interested in taking Chantx to help with smoking cessation.   Would you check with Dr Radford Pax to see if she would be willing to prescribe Chantx ?  Thanks for your assistance,  Sincerely,  Verdis Frederickson

## 2017-09-10 ENCOUNTER — Encounter (HOSPITAL_COMMUNITY): Payer: Federal, State, Local not specified - PPO

## 2017-09-10 NOTE — Telephone Encounter (Signed)
Mailbox full- Unable to leave a message

## 2017-09-13 ENCOUNTER — Other Ambulatory Visit: Payer: Federal, State, Local not specified - PPO

## 2017-09-13 ENCOUNTER — Encounter (HOSPITAL_COMMUNITY)
Admission: RE | Admit: 2017-09-13 | Discharge: 2017-09-13 | Disposition: A | Discharge status: Home / Self Care | Payer: Federal, State, Local not specified - PPO | Source: Ambulatory Visit | Attending: Cardiology | Admitting: Cardiology

## 2017-09-13 DIAGNOSIS — Z955 Presence of coronary angioplasty implant and graft: Secondary | ICD-10-CM

## 2017-09-13 DIAGNOSIS — F329 Major depressive disorder, single episode, unspecified: Secondary | ICD-10-CM | POA: Diagnosis not present

## 2017-09-13 DIAGNOSIS — F17210 Nicotine dependence, cigarettes, uncomplicated: Secondary | ICD-10-CM | POA: Diagnosis not present

## 2017-09-13 DIAGNOSIS — I2510 Atherosclerotic heart disease of native coronary artery without angina pectoris: Secondary | ICD-10-CM | POA: Diagnosis not present

## 2017-09-13 DIAGNOSIS — Z7902 Long term (current) use of antithrombotics/antiplatelets: Secondary | ICD-10-CM | POA: Diagnosis not present

## 2017-09-13 DIAGNOSIS — E785 Hyperlipidemia, unspecified: Secondary | ICD-10-CM | POA: Diagnosis not present

## 2017-09-13 DIAGNOSIS — E119 Type 2 diabetes mellitus without complications: Secondary | ICD-10-CM | POA: Diagnosis not present

## 2017-09-13 DIAGNOSIS — K219 Gastro-esophageal reflux disease without esophagitis: Secondary | ICD-10-CM | POA: Diagnosis not present

## 2017-09-13 DIAGNOSIS — Z79899 Other long term (current) drug therapy: Secondary | ICD-10-CM | POA: Diagnosis not present

## 2017-09-13 DIAGNOSIS — I10 Essential (primary) hypertension: Secondary | ICD-10-CM | POA: Diagnosis not present

## 2017-09-13 DIAGNOSIS — Z7982 Long term (current) use of aspirin: Secondary | ICD-10-CM | POA: Diagnosis not present

## 2017-09-13 DIAGNOSIS — I6523 Occlusion and stenosis of bilateral carotid arteries: Secondary | ICD-10-CM | POA: Diagnosis not present

## 2017-09-13 LAB — LIPID PANEL
CHOL/HDL RATIO: 2.9 ratio (ref 0.0–5.0)
Cholesterol, Total: 96 mg/dL — ABNORMAL LOW (ref 100–199)
HDL: 33 mg/dL — ABNORMAL LOW (ref >39)
LDL CALC: 45 mg/dL (ref 0–99)
TRIGLYCERIDES: 91 mg/dL (ref 0–149)
VLDL Cholesterol Cal: 18 mg/dL (ref 5–40)

## 2017-09-13 LAB — HEPATIC FUNCTION PANEL
ALT: 14 IU/L (ref 0–44)
AST: 22 IU/L (ref 0–40)
Albumin: 4.6 g/dL (ref 3.6–4.8)
Alkaline Phosphatase: 65 IU/L (ref 39–117)
BILIRUBIN TOTAL: 0.3 mg/dL (ref 0.0–1.2)
Bilirubin, Direct: 0.1 mg/dL (ref 0.00–0.40)
Total Protein: 6.8 g/dL (ref 6.0–8.5)

## 2017-09-13 NOTE — Telephone Encounter (Signed)
Unable to leave message for patient. Chantix has been ordered. Closing encounter due to unable to reach patient.

## 2017-09-15 ENCOUNTER — Encounter (HOSPITAL_COMMUNITY): Payer: Federal, State, Local not specified - PPO

## 2017-09-16 NOTE — Progress Notes (Signed)
QUALITY OF LIFE SCORE REVIEW  Donald Jennings completed Quality of Life survey as a participant in Cardiac Rehab. Scores 21.0 or below are considered low. Pt score very low in several areas Overall 19.47, Health and Function 18.70, socioeconomic 20.38, physiological and spiritual 18.50, family 21.6. Patient quality of life slightly altered by physical constraints which limits ability to perform as prior to recent cardiac illness.  Donald Jennings says he has been feeling better since he has been participating in phase 2 cardiac rehab.  Offered emotional support and reassurance.  Will continue to monitor and intervene as necessary.  Donald Jennings said he has been having difficulty in quitting smoking and is interested in trying Chantix. Dr Theodosia Blender office notified.Will continue to monitor the patient throughout  the program.Donald Jennings Venetia Maxon, RN,BSN 09/16/2017 3:57 PM

## 2017-09-17 ENCOUNTER — Encounter (HOSPITAL_COMMUNITY): Payer: Federal, State, Local not specified - PPO

## 2017-09-20 ENCOUNTER — Encounter (HOSPITAL_COMMUNITY)
Admission: RE | Admit: 2017-09-20 | Discharge: 2017-09-20 | Disposition: A | Discharge status: Home / Self Care | Payer: Federal, State, Local not specified - PPO | Source: Ambulatory Visit | Attending: Cardiology | Admitting: Cardiology

## 2017-09-20 DIAGNOSIS — Z955 Presence of coronary angioplasty implant and graft: Secondary | ICD-10-CM

## 2017-09-20 DIAGNOSIS — I2510 Atherosclerotic heart disease of native coronary artery without angina pectoris: Secondary | ICD-10-CM | POA: Diagnosis not present

## 2017-09-20 DIAGNOSIS — Z7902 Long term (current) use of antithrombotics/antiplatelets: Secondary | ICD-10-CM | POA: Diagnosis not present

## 2017-09-20 DIAGNOSIS — F329 Major depressive disorder, single episode, unspecified: Secondary | ICD-10-CM | POA: Diagnosis not present

## 2017-09-20 DIAGNOSIS — E785 Hyperlipidemia, unspecified: Secondary | ICD-10-CM | POA: Diagnosis not present

## 2017-09-20 DIAGNOSIS — Z7982 Long term (current) use of aspirin: Secondary | ICD-10-CM | POA: Diagnosis not present

## 2017-09-20 DIAGNOSIS — I6523 Occlusion and stenosis of bilateral carotid arteries: Secondary | ICD-10-CM | POA: Diagnosis not present

## 2017-09-20 DIAGNOSIS — F17210 Nicotine dependence, cigarettes, uncomplicated: Secondary | ICD-10-CM | POA: Diagnosis not present

## 2017-09-20 DIAGNOSIS — K219 Gastro-esophageal reflux disease without esophagitis: Secondary | ICD-10-CM | POA: Diagnosis not present

## 2017-09-20 DIAGNOSIS — I10 Essential (primary) hypertension: Secondary | ICD-10-CM | POA: Diagnosis not present

## 2017-09-20 DIAGNOSIS — Z79899 Other long term (current) drug therapy: Secondary | ICD-10-CM | POA: Diagnosis not present

## 2017-09-20 DIAGNOSIS — E119 Type 2 diabetes mellitus without complications: Secondary | ICD-10-CM | POA: Diagnosis not present

## 2017-09-22 ENCOUNTER — Encounter (HOSPITAL_COMMUNITY)
Admission: RE | Admit: 2017-09-22 | Discharge: 2017-09-22 | Disposition: A | Discharge status: Home / Self Care | Payer: Federal, State, Local not specified - PPO | Source: Ambulatory Visit | Attending: Cardiology | Admitting: Cardiology

## 2017-09-22 DIAGNOSIS — I2510 Atherosclerotic heart disease of native coronary artery without angina pectoris: Secondary | ICD-10-CM | POA: Diagnosis not present

## 2017-09-22 DIAGNOSIS — Z79899 Other long term (current) drug therapy: Secondary | ICD-10-CM | POA: Diagnosis not present

## 2017-09-22 DIAGNOSIS — E119 Type 2 diabetes mellitus without complications: Secondary | ICD-10-CM | POA: Diagnosis not present

## 2017-09-22 DIAGNOSIS — Z7982 Long term (current) use of aspirin: Secondary | ICD-10-CM | POA: Diagnosis not present

## 2017-09-22 DIAGNOSIS — F329 Major depressive disorder, single episode, unspecified: Secondary | ICD-10-CM | POA: Diagnosis not present

## 2017-09-22 DIAGNOSIS — I10 Essential (primary) hypertension: Secondary | ICD-10-CM | POA: Diagnosis not present

## 2017-09-22 DIAGNOSIS — F17210 Nicotine dependence, cigarettes, uncomplicated: Secondary | ICD-10-CM | POA: Diagnosis not present

## 2017-09-22 DIAGNOSIS — Z7902 Long term (current) use of antithrombotics/antiplatelets: Secondary | ICD-10-CM | POA: Diagnosis not present

## 2017-09-22 DIAGNOSIS — Z955 Presence of coronary angioplasty implant and graft: Secondary | ICD-10-CM

## 2017-09-22 DIAGNOSIS — I6523 Occlusion and stenosis of bilateral carotid arteries: Secondary | ICD-10-CM | POA: Diagnosis not present

## 2017-09-22 DIAGNOSIS — K219 Gastro-esophageal reflux disease without esophagitis: Secondary | ICD-10-CM | POA: Diagnosis not present

## 2017-09-22 DIAGNOSIS — E785 Hyperlipidemia, unspecified: Secondary | ICD-10-CM | POA: Diagnosis not present

## 2017-09-24 ENCOUNTER — Encounter (HOSPITAL_COMMUNITY): Payer: Federal, State, Local not specified - PPO

## 2017-09-27 ENCOUNTER — Encounter (HOSPITAL_COMMUNITY)
Admission: RE | Admit: 2017-09-27 | Discharge: 2017-09-27 | Disposition: A | Discharge status: Home / Self Care | Payer: Federal, State, Local not specified - PPO | Source: Ambulatory Visit | Attending: Cardiology | Admitting: Cardiology

## 2017-09-27 DIAGNOSIS — Z7982 Long term (current) use of aspirin: Secondary | ICD-10-CM | POA: Diagnosis not present

## 2017-09-27 DIAGNOSIS — Z955 Presence of coronary angioplasty implant and graft: Secondary | ICD-10-CM | POA: Diagnosis not present

## 2017-09-27 DIAGNOSIS — F17210 Nicotine dependence, cigarettes, uncomplicated: Secondary | ICD-10-CM | POA: Diagnosis not present

## 2017-09-27 DIAGNOSIS — K219 Gastro-esophageal reflux disease without esophagitis: Secondary | ICD-10-CM | POA: Diagnosis not present

## 2017-09-27 DIAGNOSIS — I2510 Atherosclerotic heart disease of native coronary artery without angina pectoris: Secondary | ICD-10-CM | POA: Diagnosis not present

## 2017-09-27 DIAGNOSIS — F329 Major depressive disorder, single episode, unspecified: Secondary | ICD-10-CM | POA: Diagnosis not present

## 2017-09-27 DIAGNOSIS — I10 Essential (primary) hypertension: Secondary | ICD-10-CM | POA: Diagnosis not present

## 2017-09-27 DIAGNOSIS — Z7902 Long term (current) use of antithrombotics/antiplatelets: Secondary | ICD-10-CM | POA: Diagnosis not present

## 2017-09-27 DIAGNOSIS — Z79899 Other long term (current) drug therapy: Secondary | ICD-10-CM | POA: Diagnosis not present

## 2017-09-27 DIAGNOSIS — I6523 Occlusion and stenosis of bilateral carotid arteries: Secondary | ICD-10-CM | POA: Diagnosis not present

## 2017-09-27 DIAGNOSIS — E785 Hyperlipidemia, unspecified: Secondary | ICD-10-CM | POA: Diagnosis not present

## 2017-09-27 DIAGNOSIS — E119 Type 2 diabetes mellitus without complications: Secondary | ICD-10-CM | POA: Diagnosis not present

## 2017-09-27 LAB — GLUCOSE, CAPILLARY: GLUCOSE-CAPILLARY: 92 mg/dL (ref 65–99)

## 2017-09-29 ENCOUNTER — Encounter (HOSPITAL_COMMUNITY): Payer: Federal, State, Local not specified - PPO

## 2017-09-30 ENCOUNTER — Other Ambulatory Visit: Payer: Self-pay | Admitting: Cardiology

## 2017-09-30 NOTE — Progress Notes (Signed)
Cardiac Individual Treatment Plan  Patient Details  Name: [facility] MRN: [id number] Date of Birth: 08-Aug-1956 Referring Provider:     [place] from 08/24/2017 in [place]  Referring Provider  Fransico Him MD      Initial Encounter Date:    CARDIAC REHAB PHASE II ORIENTATION from 08/24/2017 in [place]  Date  08/24/17  Referring Provider  Fransico Him MD      Visit Diagnosis: Stented coronary artery 08/04/2017 DES pLAD, d RCA  Patient's Home Medications on Admission:  Current Outpatient Prescriptions:  .  aspirin EC 81 MG tablet, Take 1 tablet (81 mg total) by mouth daily., Disp: 90 tablet, Rfl: 3 .  clopidogrel (PLAVIX) 75 MG tablet, Take 1 tablet (75 mg total) by mouth daily with breakfast., Disp: 90 tablet, Rfl: 2 .  lidocaine (LIDODERM) 5 %, Place 1 patch onto the skin daily. Remove & Discard patch within 12 hours or as directed by MD, Disp: , Rfl:  .  losartan (COZAAR) 100 MG tablet, TAKE 1 TABLET (100 MG TOTAL) BY MOUTH DAILY., Disp: 90 tablet, Rfl: 3 .  metoprolol succinate (TOPROL-XL) 25 MG 24 hr tablet, TAKE 1 TABLET (25 MG TOTAL) BY MOUTH DAILY., Disp: 90 tablet, Rfl: 1 .  Multiple Vitamins-Minerals (MULTIVITAMIN ADULT) TABS, Take 1 tablet by mouth daily., Disp: , Rfl:  .  nitroGLYCERIN (NITROSTAT) 0.4 MG SL tablet, Place 1 tablet (0.4 mg total) under the tongue every 5 (five) minutes as needed., Disp: 25 tablet, Rfl: 2 .  pantoprazole (PROTONIX) 40 MG tablet, TAKE 1 TABLET BY MOUTH EVERY DAY, Disp: 30 tablet, Rfl: 10 .  rosuvastatin (CRESTOR) 40 MG tablet, Take 1 tablet (40 mg total) by mouth daily., Disp: 30 tablet, Rfl: 11 .  varenicline (CHANTIX CONTINUING MONTH PAK) 1 MG tablet, Take 1 tablet (1 mg total) by mouth 2 (two) times daily., Disp: 60 tablet, Rfl: 2 .  Vitamin D, Ergocalciferol, (DRISDOL) 50000 units CAPS capsule, TAKE 1 CAPSULE (50,000 UNITS TOTAL) BY MOUTH ONCE A  WEEK., Disp: 4 capsule, Rfl: 7  Past Medical History: Past Medical History:  Diagnosis Date  . CAD (coronary artery disease)   . CAD (coronary artery disease), native coronary artery 07/27/2017   < 40% prox LAD, 50% mid LAD and 40% distal RCA  . Carotid artery stenosis 07/27/2017   1-39% bilateral  . CERVICAL RADICULOPATHY 07/02/2010   Qualifier: Diagnosis of  By: Ronnald Ramp MD, Arvid Right.   . DEPRESSION 03/21/2009   Qualifier: Diagnosis of  By: Ronnald Ramp MD, Arvid Right.   . Diabetes mellitus without complication ([place])   . GERD 06/19/2009   Qualifier: Diagnosis of  By: Ronnald Ramp MD, Arvid Right.   [name] HLD (hyperlipidemia)   . Hx of cardiovascular stress test    ETT-Myoview (03/2014):  No ischemia, EF 56%; Low Risk  . HYPERTENSION 06/19/2009   Qualifier: Diagnosis of  By: Ronnald Ramp MD, [place], RIGHT 07/02/2010   Qualifier: Diagnosis of  By: Ronnald Ramp MD, Arvid Right.   . SOB (shortness of breath) 06/10/2016  . TOBACCO USE 03/21/2009   Qualifier: Diagnosis of  By: Ronnald Ramp MD, Arvid Right.     Tobacco Use: History  Smoking Status  . Current Every Day Smoker  . Packs/day: 1.00  . Years: 38.50  . Types: Cigarettes  Smokeless Tobacco  . Never Used    Comment: Currently  smokes 1/2 ppd and is considering Chantix  Labs: Recent Review Flowsheet Data    Labs for ITP Cardiac and Pulmonary Rehab Latest Ref Rng & Units 03/27/2015 05/27/2015 05/18/2016 08/14/2016 09/13/2017   Cholestrol 100 - 199 mg/dL 208(H) - 243(H) 93(L) 96(L)   LDLCALC 0 - 99 mg/dL 133(H) - 173(H) 36 45   LDLDIRECT mg/dL - - - - -   HDL >39 mg/dL 35.50(L) - 39.10 32(L) 33(L)   Trlycerides 0 - 149 mg/dL 199.0(H) - 151.0(H) 126 91   Hemoglobin A1c - 6.5 6.3 6.8 - -      Capillary Blood Glucose: Lab Results  Component Value Date   GLUCAP 92 09/27/2017   GLUCAP 106 (H) 08/30/2017   GLUCAP 128 (H) 08/30/2017   GLUCAP 111 (H) 04/06/2017     Exercise Target Goals:    Exercise Program Goal: Individual exercise prescription set  with THRR, safety & activity barriers. Participant demonstrates ability to understand and report RPE using BORG scale, to self-measure pulse accurately, and to acknowledge the importance of the exercise prescription.  Exercise Prescription Goal: Starting with aerobic activity 30 plus minutes a day, 3 days per week for initial exercise prescription. Provide home exercise prescription and guidelines that participant acknowledges understanding prior to discharge.  Activity Barriers & Risk Stratification:     Activity Barriers & Cardiac Risk Stratification - 08/24/17 1110      Activity Barriers & Cardiac Risk Stratification   Activity Barriers Arthritis;Other (comment)   Comments arthritis in R hand, aches and pain in back (buldging disc in T-spine), R frozen shoulder, occasional L knee discomfort/swelling tx with cortisone injections   Cardiac Risk Stratification High      6 Minute Walk:     6 Minute Walk    Row Name 08/24/17 1116         6 Minute Walk   Phase Initial     Distance 1892 feet     Walk Time 6 minutes     # of Rest Breaks 0     MPH 3.58     METS 4.86     RPE 12     VO2 Peak 17.02     Symptoms No     Resting HR 74 bpm     Resting BP 112/64     Resting Oxygen Saturation  98 %     Exercise Oxygen Saturation  during 6 min walk 100 %     Max Ex. HR 105 bpm     Max Ex. BP 142/62     2 Minute Post BP 106/66        Oxygen Initial Assessment:   Oxygen Re-Evaluation:   Oxygen Discharge (Final Oxygen Re-Evaluation):   Initial Exercise Prescription:     Initial Exercise Prescription - 08/24/17 1100      Date of Initial Exercise RX and Referring Provider   Date 08/24/17   Referring Provider Fransico Him MD     Treadmill   MPH 3   Grade 0   Minutes 10   METs 3.3     Bike   Level 1   Minutes 10   METs 3.7     NuStep   Level 4   SPM 80   Minutes 10   METs 3     Prescription Details   Frequency (times per week) 3   Duration Progress to 30  minutes of continuous aerobic without signs/symptoms of physical distress     Intensity   THRR 40-80% of Max Heartrate 64-128   Ratings of  Perceived Exertion 11-13   Perceived Dyspnea 0-4     Progression   Progression Continue to progress workloads to maintain intensity without signs/symptoms of physical distress.     Resistance Training   Training Prescription Yes   Weight 4lbs   Reps 10-15      Perform Capillary Blood Glucose checks as needed.  Exercise Prescription Changes:      Exercise Prescription Changes    Row Name 08/30/17 1700 09/13/17 1445 09/27/17 1700         Response to Exercise   Blood Pressure (Admit) 104/68 122/64 108/68     Blood Pressure (Exercise) 148/86 142/74 152/80     Blood Pressure (Exit) 122/70 108/60 112/70     Heart Rate (Admit) 77 bpm 90 bpm 93 bpm     Heart Rate (Exercise) 106 bpm 140 bpm 130 bpm     Heart Rate (Exit) 80 bpm 85 bpm 94 bpm     Rating of Perceived Exertion (Exercise) 11 12 12      Symptoms none none none     Comments Off to a good start with exercise.  -  -     Duration Continue with 30 min of aerobic exercise without signs/symptoms of physical distress. Continue with 30 min of aerobic exercise without signs/symptoms of physical distress. Continue with 30 min of aerobic exercise without signs/symptoms of physical distress.     Intensity THRR unchanged THRR unchanged THRR unchanged       Progression   Progression Continue to progress workloads to maintain intensity without signs/symptoms of physical distress. Continue to progress workloads to maintain intensity without signs/symptoms of physical distress. Continue to progress workloads to maintain intensity without signs/symptoms of physical distress.     Average METs 3.1 4.4 4.6       Resistance Training   Training Prescription Yes Yes Yes     Weight 4lbs 4lbs 4lbs     Reps 10-15 10-15 10-15     Time 10 Minutes 10 Minutes 10 Minutes       Interval Training   Interval  Training No No No       Treadmill   MPH 3 3.5 3.5     Grade 0 4 4     Minutes 10 10 10      METs 3.3 5.61 5.61       Bike   Level 1 1.4 1.4     Minutes 10 10 10      METs 3.66 4.67 4.71       NuStep   Level 4 4 4      SPM 80 80 80     Minutes 10 10 10      METs 2.4 2.9 3.5       Home Exercise Plan   Plans to continue exercise at  - Home (comment) Home (comment)     Frequency  - Add 2 additional days to program exercise sessions. Add 2 additional days to program exercise sessions.     Initial Home Exercises Provided  - 09/03/17 09/03/17        Exercise Comments:      Exercise Comments    Row Name 09/03/17 1531           Exercise Comments Reviewed home exercise prescription, METs and goals.          Exercise Goals and Review:      Exercise Goals    Row Name 08/24/17 [phone]  Exercise Goals   Increase Physical Activity Yes       Intervention Provide advice, education, support and counseling about physical activity/exercise needs.;Develop an individualized exercise prescription for aerobic and resistive training based on initial evaluation findings, risk stratification, comorbidities and participant's personal goals.       Expected Outcomes Achievement of increased cardiorespiratory fitness and enhanced flexibility, muscular endurance and strength shown through measurements of functional capacity and personal statement of participant.       Increase Strength and Stamina Yes  improve physical fitness and be more agile; increase flexibility       Intervention Provide advice, education, support and counseling about physical activity/exercise needs.;Develop an individualized exercise prescription for aerobic and resistive training based on initial evaluation findings, risk stratification, comorbidities and participant's personal goals.       Expected Outcomes Achievement of increased cardiorespiratory fitness and enhanced flexibility, muscular endurance and  strength shown through measurements of functional capacity and personal statement of participant.       Able to understand and use rate of perceived exertion (RPE) scale Yes       Intervention Provide education and explanation on how to use RPE scale       Expected Outcomes Short Term: Able to use RPE daily in rehab to express subjective intensity level;Long Term:  Able to use RPE to guide intensity level when exercising independently       Knowledge and understanding of Target Heart Rate Range (THRR) Yes       Intervention Provide education and explanation of THRR including how the numbers were predicted and where they are located for reference       Expected Outcomes Short Term: Able to state/look up THRR;Long Term: Able to use THRR to govern intensity when exercising independently;Short Term: Able to use daily as guideline for intensity in rehab       Able to check pulse independently Yes       Intervention Provide education and demonstration on how to check pulse in carotid and radial arteries.;Review the importance of being able to check your own pulse for safety during independent exercise       Expected Outcomes Short Term: Able to explain why pulse checking is important during independent exercise;Long Term: Able to check pulse independently and accurately       Understanding of Exercise Prescription Yes       Intervention Provide education, explanation, and written materials on patient's individual exercise prescription       Expected Outcomes Short Term: Able to explain program exercise prescription;Long Term: Able to explain home exercise prescription to exercise independently          Exercise Goals Re-Evaluation :     Exercise Goals Re-Evaluation    Row Name 09/01/17 1500 09/03/17 1521           Exercise Goal Re-Evaluation   Exercise Goals Review Increase Physical Activity Understanding of Exercise Prescription;Knowledge and understanding of Target Heart Rate Range (THRR);Able  to understand and use rate of perceived exertion (RPE) scale      Comments Pt states that he is currently walking 3-4 days/week at work. Reviewed home exercise guidelines with patient including THRR and RPE scale.      Expected Outcomes Begin consistent exercise routine, walking 30 minutes 3-4 days/week. Begin consistent exercise routine, walking 30 minutes 3-4 days/week.          Discharge Exercise Prescription (Final Exercise Prescription Changes):     Exercise Prescription Changes - 09/27/17  1700      Response to Exercise   Blood Pressure (Admit) 108/68   Blood Pressure (Exercise) 152/80   Blood Pressure (Exit) 112/70   Heart Rate (Admit) 93 bpm   Heart Rate (Exercise) 130 bpm   Heart Rate (Exit) 94 bpm   Rating of Perceived Exertion (Exercise) 12   Symptoms none   Duration Continue with 30 min of aerobic exercise without signs/symptoms of physical distress.   Intensity THRR unchanged     Progression   Progression Continue to progress workloads to maintain intensity without signs/symptoms of physical distress.   Average METs 4.6     Resistance Training   Training Prescription Yes   Weight 4lbs   Reps 10-15   Time 10 Minutes     Interval Training   Interval Training No     Treadmill   MPH 3.5   Grade 4   Minutes 10   METs 5.61     Bike   Level 1.4   Minutes 10   METs 4.71     NuStep   Level 4   SPM 80   Minutes 10   METs 3.5     Home Exercise Plan   Plans to continue exercise at Home (comment)   Frequency Add 2 additional days to program exercise sessions.   Initial Home Exercises Provided 09/03/17      Nutrition:  Target Goals: Understanding of nutrition guidelines, daily intake of sodium 1500mg , cholesterol 200mg , calories 30% from fat and 7% or less from saturated fats, daily to have 5 or more servings of fruits and vegetables.  Biometrics:     Pre Biometrics - 08/24/17 1223      Pre Biometrics   Height 5' 8.25" (1.734 m)   Weight 156 lb  12 oz (71.1 kg)   Waist Circumference 34 inches   Hip Circumference 37.5 inches   Waist to Hip Ratio 0.91 %   BMI (Calculated) 23.65   Triceps Skinfold 14 mm   % Body Fat 22.7 %   Grip Strength 37 kg   Flexibility 7.5 in   Single Leg Stand 28.31 seconds       Nutrition Therapy Plan and Nutrition Goals:     Nutrition Therapy & Goals - 08/24/17 1137      Nutrition Therapy   Diet Carb Modified, Therapeutic Lifestyle Changes     Personal Nutrition Goals   Nutrition Goal Pt to identify and limit food sources of saturated fat, trans fat, and sodium   Personal Goal #2 Pt able to name foods that affect blood glucose      Intervention Plan   Intervention Prescribe, educate and counsel regarding individualized specific dietary modifications aiming towards targeted core components such as weight, hypertension, lipid management, diabetes, heart failure and other comorbidities.   Expected Outcomes Short Term Goal: Understand basic principles of dietary content, such as calories, fat, sodium, cholesterol and nutrients.;Long Term Goal: Adherence to prescribed nutrition plan.      Nutrition Discharge: Nutrition Scores:     Nutrition Assessments - 08/24/17 1137      MEDFICTS Scores   Pre Score 62      Nutrition Goals Re-Evaluation:   Nutrition Goals Re-Evaluation:   Nutrition Goals Discharge (Final Nutrition Goals Re-Evaluation):   Psychosocial: Target Goals: Acknowledge presence or absence of significant depression and/or stress, maximize coping skills, provide positive support system. Participant is able to verbalize types and ability to use techniques and skills needed for reducing stress and depression.  Initial Review & Psychosocial Screening:     Initial Psych Review & Screening - 08/24/17 0914      Initial Review   Current issues with None Identified     Family Dynamics   Good Support System? Yes  spouse   Comments upon brief assessment, no psychosocial needs  identified, no interventions necessary      Barriers   Psychosocial barriers to participate in program There are no identifiable barriers or psychosocial needs.     Screening Interventions   Interventions Encouraged to exercise;Provide feedback about the scores to participant      Quality of Life Scores:     Quality of Life - 09/16/17 1545      Quality of Life Scores   Health/Function Pre --  Slightly satisfied with health due to recent stent placement   GLOBAL Pre --  Niranjan says he has been feeling better since he has been participating in phase 2 cardiac rehab.      PHQ-9: Recent Review Flowsheet Data    Depression screen [facility] 2/9 08/30/2017   Decreased Interest 0   Down, Depressed, Hopeless 0   PHQ - 2 Score 0     Interpretation of Total Score  Total Score Depression Severity:  1-4 = Minimal depression, 5-9 = Mild depression, 10-14 = Moderate depression, 15-19 = Moderately severe depression, 20-27 = Severe depression   Psychosocial Evaluation and Intervention:   Psychosocial Re-Evaluation:     Psychosocial Re-Evaluation    Row Name 09/07/17 1623 09/30/17 1435           Psychosocial Re-Evaluation   Current issues with None Identified None Identified      Interventions Encouraged to attend Cardiac Rehabilitation for the exercise Encouraged to attend Cardiac Rehabilitation for the exercise      Continue Psychosocial Services  No Follow up required No Follow up required         Psychosocial Discharge (Final Psychosocial Re-Evaluation):     Psychosocial Re-Evaluation - 09/30/17 1435      Psychosocial Re-Evaluation   Current issues with None Identified   Interventions Encouraged to attend Cardiac Rehabilitation for the exercise   Continue Psychosocial Services  No Follow up required      Vocational Rehabilitation: Provide vocational rehab assistance to qualifying candidates.   Vocational Rehab Evaluation & Intervention:     Vocational Rehab - 08/24/17  0915      Initial Vocational Rehab Evaluation & Intervention   Assessment shows need for Vocational Rehabilitation No      Education: Education Goals: Education classes will be provided on a weekly basis, covering required topics. Participant will state understanding/return demonstration of topics presented.  Learning Barriers/Preferences:     Learning Barriers/Preferences - 08/24/17 1110      Learning Barriers/Preferences   Learning Barriers Sight   Learning Preferences Written Material;Verbal Instruction;Skilled Demonstration;Pictoral;Video      Education Topics: Count Your Pulse:  -Group instruction provided by verbal instruction, demonstration, patient participation and written materials to support subject.  Instructors address importance of being able to find your pulse and how to count your pulse when at home without a heart monitor.  Patients get hands on experience counting their pulse with staff help and individually.   Heart Attack, Angina, and Risk Factor Modification:  -Group instruction provided by verbal instruction, video, and written materials to support subject.  Instructors address signs and symptoms of angina and heart attacks.    Also discuss risk factors for heart disease and how to  make changes to improve heart health risk factors.   Functional Fitness:  -Group instruction provided by verbal instruction, demonstration, patient participation, and written materials to support subject.  Instructors address safety measures for doing things around the house.  Discuss how to get up and down off the floor, how to pick things up properly, how to safely get out of a chair without assistance, and balance training.   Meditation and Mindfulness:  -Group instruction provided by verbal instruction, patient participation, and written materials to support subject.  Instructor addresses importance of mindfulness and meditation practice to help reduce stress and improve  awareness.  Instructor also leads participants through a meditation exercise.    CARDIAC REHAB PHASE II EXERCISE from 09/01/2017 in [place]  Date  09/01/17  Instruction Review Code  2- meets goals/outcomes      Stretching for Flexibility and Mobility:  -Group instruction provided by verbal instruction, patient participation, and written materials to support subject.  Instructors lead participants through series of stretches that are designed to increase flexibility thus improving mobility.  These stretches are additional exercise for major muscle groups that are typically performed during regular warm up and cool down.   Hands Only CPR:  -Group verbal, video, and participation provides a basic overview of AHA guidelines for community CPR. Role-play of emergencies allow participants the opportunity to practice calling for help and chest compression technique with discussion of AED use.   Hypertension: -Group verbal and written instruction that provides a basic overview of hypertension including the most recent diagnostic guidelines, risk factor reduction with self-care instructions and medication management.    Nutrition I class: Heart Healthy Eating:  -Group instruction provided by PowerPoint slides, verbal discussion, and written materials to support subject matter. The instructor gives an explanation and review of the Therapeutic Lifestyle Changes diet recommendations, which includes a discussion on lipid goals, dietary fat, sodium, fiber, plant stanol/sterol esters, sugar, and the components of a well-balanced, healthy diet.   CARDIAC REHAB PHASE II EXERCISE from 09/01/2017 in [place]  Date  08/24/17  Educator  RD  Instruction Review Code  Not applicable      Nutrition II class: Lifestyle Skills:  -Group instruction provided by PowerPoint slides, verbal discussion, and written materials to support subject matter. The  instructor gives an explanation and review of label reading, grocery shopping for heart health, heart healthy recipe modifications, and ways to make healthier choices when eating out.   CARDIAC REHAB PHASE II EXERCISE from 09/01/2017 in [place]  Date  08/24/17  Educator  RD  Instruction Review Code  Not applicable      Diabetes Question & Answer:  -Group instruction provided by PowerPoint slides, verbal discussion, and written materials to support subject matter. The instructor gives an explanation and review of diabetes co-morbidities, pre- and post-prandial blood glucose goals, pre-exercise blood glucose goals, signs, symptoms, and treatment of hypoglycemia and hyperglycemia, and foot care basics.   Diabetes Blitz:  -Group instruction provided by PowerPoint slides, verbal discussion, and written materials to support subject matter. The instructor gives an explanation and review of the physiology behind type 1 and type 2 diabetes, diabetes medications and rational behind using different medications, pre- and post-prandial blood glucose recommendations and Hemoglobin A1c goals, diabetes diet, and exercise including blood glucose guidelines for exercising safely.    CARDIAC REHAB PHASE II EXERCISE from 09/01/2017 in [place]  Date  08/24/17  Educator  RD  Instruction Review Code  Not applicable      Portion Distortion:  -Group instruction provided by PowerPoint slides, verbal discussion, written materials, and food models to support subject matter. The instructor gives an explanation of serving size versus portion size, changes in portions sizes over the last 20 years, and what consists of a serving from each food group.   Stress Management:  -Group instruction provided by verbal instruction, video, and written materials to support subject matter.  Instructors review role of stress in heart disease and how to cope with stress  positively.     Exercising on Your Own:  -Group instruction provided by verbal instruction, power point, and written materials to support subject.  Instructors discuss benefits of exercise, components of exercise, frequency and intensity of exercise, and end points for exercise.  Also discuss use of nitroglycerin and activating EMS.  Review options of places to exercise outside of rehab.  Review guidelines for sex with heart disease.   Cardiac Drugs I:  -Group instruction provided by verbal instruction and written materials to support subject.  Instructor reviews cardiac drug classes: antiplatelets, anticoagulants, beta blockers, and statins.  Instructor discusses reasons, side effects, and lifestyle considerations for each drug class.   Cardiac Drugs II:  -Group instruction provided by verbal instruction and written materials to support subject.  Instructor reviews cardiac drug classes: angiotensin converting enzyme inhibitors (ACE-I), angiotensin II receptor blockers (ARBs), nitrates, and calcium channel blockers.  Instructor discusses reasons, side effects, and lifestyle considerations for each drug class.   Anatomy and Physiology of the Circulatory System:  Group verbal and written instruction and models provide basic cardiac anatomy and physiology, with the coronary electrical and arterial systems. Review of: AMI, Angina, Valve disease, Heart Failure, Peripheral Artery Disease, Cardiac Arrhythmia, Pacemakers, and the ICD.   Other Education:  -Group or individual verbal, written, or video instructions that support the educational goals of the cardiac rehab program.   Knowledge Questionnaire Score:     Knowledge Questionnaire Score - 08/24/17 0913      Knowledge Questionnaire Score   Pre Score 23/28     DM 10/15      Core Components/Risk Factors/Patient Goals at Admission:     Personal Goals and Risk Factors at Admission - 08/24/17 1113      Core Components/Risk  Factors/Patient Goals on Admission   Tobacco Cessation Yes   Number of packs per day 1 ppd   Intervention Assist the participant in steps to quit. Provide individualized education and counseling about committing to Tobacco Cessation, relapse prevention, and pharmacological support that can be provided by physician.;Advice worker, assist with locating and accessing local/national Quit Smoking programs, and support quit date choice.   Expected Outcomes Short Term: Will demonstrate readiness to quit, by selecting a quit date.;Short Term: Will quit all tobacco product use, adhering to prevention of relapse plan.;Long Term: Complete abstinence from all tobacco products for at least 12 months from quit date.   Diabetes Yes   Intervention Provide education about signs/symptoms and action to take for hypo/hyperglycemia.;Provide education about proper nutrition, including hydration, and aerobic/resistive exercise prescription along with prescribed medications to achieve blood glucose in normal ranges: Fasting glucose 65-99 mg/dL   Expected Outcomes Short Term: Participant verbalizes understanding of the signs/symptoms and immediate care of hyper/hypoglycemia, proper foot care and importance of medication, aerobic/resistive exercise and nutrition plan for blood glucose control.;Long Term: Attainment of HbA1C < 7%.   Hypertension Yes  Intervention Provide education on lifestyle modifcations including regular physical activity/exercise, weight management, moderate sodium restriction and increased consumption of fresh fruit, vegetables, and low fat dairy, alcohol moderation, and smoking cessation.;Monitor prescription use compliance.   Expected Outcomes Long Term: Maintenance of blood pressure at goal levels.;Short Term: Continued assessment and intervention until BP is < 140/59mm HG in hypertensive participants. < 130/20mm HG in hypertensive participants with diabetes, heart failure or chronic kidney  disease.   Lipids Yes   Intervention Provide education and support for participant on nutrition & aerobic/resistive exercise along with prescribed medications to achieve LDL 70mg , HDL >40mg .   Expected Outcomes Short Term: Participant states understanding of desired cholesterol values and is compliant with medications prescribed. Participant is following exercise prescription and nutrition guidelines.;Long Term: Cholesterol controlled with medications as prescribed, with individualized exercise RX and with personalized nutrition plan. Value goals: LDL < 70mg , HDL > 40 mg.      Core Components/Risk Factors/Patient Goals Review:      Goals and Risk Factor Review    Row Name 09/07/17 1603 09/30/17 1437           Core Components/Risk Factors/Patient Goals Review   Personal Goals Review Lipids;Hypertension;Tobacco Cessation Lipids;Hypertension;Tobacco Cessation      Review -  Armour continues to smoke, I sent a message to Dr Radford Pax to see if she can prescribe Chantx to help with smoking cessatoin per patients request -  Quandre has the presripttion for chantix but has not started taking it yest. Will continue to encourage smoking cessation.      Expected Outcomes Wells will continue to take his medicaitons as presribed for HTN and Hyperlipidemia and exercise at cardiac rehab Koben will continue to take his medicaitons as presribed for HTN and Hyperlipidemia and exercise at cardiac rehab         Core Components/Risk Factors/Patient Goals at Discharge (Final Review):      Goals and Risk Factor Review - 09/30/17 1437      Core Components/Risk Factors/Patient Goals Review   Personal Goals Review Lipids;Hypertension;Tobacco Cessation   Review --  Bon has the presripttion for chantix but has not started taking it yest. Will continue to encourage smoking cessation.   Expected Outcomes Zohair will continue to take his medicaitons as presribed for HTN and Hyperlipidemia and exercise at cardiac rehab       ITP Comments:     ITP Comments    Row Name 08/24/17 1108           ITP Comments Dr. Fransico Him, Medical Director          Comments: Christan is making expected progress toward personal goals after completing 10 sessions. Recommend continued exercise and life style modification education including  stress management and relaxation techniques to decrease cardiac risk profile. Jackie''s attendance has been variable due to his work schedule. Will continue to encourage smoking cessation. Joven continues to smoke cigarettes. Barnet Pall, RN,BSN 09/30/2017 2:43 PM

## 2017-10-01 ENCOUNTER — Encounter (HOSPITAL_COMMUNITY)
Admission: RE | Admit: 2017-10-01 | Discharge: 2017-10-01 | Disposition: A | Discharge status: Home / Self Care | Payer: Federal, State, Local not specified - PPO | Source: Ambulatory Visit | Attending: Cardiology | Admitting: Cardiology

## 2017-10-01 DIAGNOSIS — F329 Major depressive disorder, single episode, unspecified: Secondary | ICD-10-CM | POA: Diagnosis not present

## 2017-10-01 DIAGNOSIS — I2510 Atherosclerotic heart disease of native coronary artery without angina pectoris: Secondary | ICD-10-CM | POA: Insufficient documentation

## 2017-10-01 DIAGNOSIS — Z7902 Long term (current) use of antithrombotics/antiplatelets: Secondary | ICD-10-CM | POA: Insufficient documentation

## 2017-10-01 DIAGNOSIS — K219 Gastro-esophageal reflux disease without esophagitis: Secondary | ICD-10-CM | POA: Diagnosis not present

## 2017-10-01 DIAGNOSIS — I10 Essential (primary) hypertension: Secondary | ICD-10-CM | POA: Diagnosis not present

## 2017-10-01 DIAGNOSIS — E785 Hyperlipidemia, unspecified: Secondary | ICD-10-CM | POA: Diagnosis not present

## 2017-10-01 DIAGNOSIS — F17210 Nicotine dependence, cigarettes, uncomplicated: Secondary | ICD-10-CM | POA: Insufficient documentation

## 2017-10-01 DIAGNOSIS — Z7982 Long term (current) use of aspirin: Secondary | ICD-10-CM | POA: Diagnosis not present

## 2017-10-01 DIAGNOSIS — E119 Type 2 diabetes mellitus without complications: Secondary | ICD-10-CM | POA: Insufficient documentation

## 2017-10-01 DIAGNOSIS — Z955 Presence of coronary angioplasty implant and graft: Secondary | ICD-10-CM | POA: Diagnosis not present

## 2017-10-01 DIAGNOSIS — I6523 Occlusion and stenosis of bilateral carotid arteries: Secondary | ICD-10-CM | POA: Diagnosis not present

## 2017-10-01 DIAGNOSIS — Z79899 Other long term (current) drug therapy: Secondary | ICD-10-CM | POA: Insufficient documentation

## 2017-10-01 NOTE — Progress Notes (Signed)
Donald Jennings said he experienced a twinge of chest discomfort on the airdyne today during exercise at cardiac rehab.Donald Jennings reports the discomfort as a 0.5/10. Donald Jennings said the discomfort lasted only a few seconds. Donald Jennings did not report this until he was going to his third station of exercise. Telemetry rhythm Sinus rate 101. Oxygen saturation 97% on room air. Donald Jennings said he experienced a discomfort in his gums a few weeks ago during exercise at cardiac rehab and did not alert staff. Donald Jennings PAC called and notified. No new order received. Donald Jennings said if he experiences anymore chest discomfort he will need to be seen at Dr Theodosia Blender office for evaluation.Donald Jennings had no further complaints upon exit from cardiac rehab.Will continue to monitor the patient throughout  the program.Donald Jaquith Venetia Maxon, RN,BSN 10/01/2017 5:04 PM

## 2017-10-03 ENCOUNTER — Other Ambulatory Visit: Payer: Self-pay | Admitting: Internal Medicine

## 2017-10-03 DIAGNOSIS — G453 Amaurosis fugax: Secondary | ICD-10-CM

## 2017-10-03 DIAGNOSIS — E785 Hyperlipidemia, unspecified: Secondary | ICD-10-CM

## 2017-10-03 DIAGNOSIS — E118 Type 2 diabetes mellitus with unspecified complications: Secondary | ICD-10-CM

## 2017-10-04 ENCOUNTER — Encounter (HOSPITAL_COMMUNITY)
Admission: RE | Admit: 2017-10-04 | Discharge: 2017-10-04 | Disposition: A | Discharge status: Home / Self Care | Payer: Federal, State, Local not specified - PPO | Source: Ambulatory Visit | Attending: Cardiology | Admitting: Cardiology

## 2017-10-04 DIAGNOSIS — Z955 Presence of coronary angioplasty implant and graft: Secondary | ICD-10-CM | POA: Diagnosis not present

## 2017-10-04 DIAGNOSIS — Z7902 Long term (current) use of antithrombotics/antiplatelets: Secondary | ICD-10-CM | POA: Diagnosis not present

## 2017-10-04 DIAGNOSIS — K219 Gastro-esophageal reflux disease without esophagitis: Secondary | ICD-10-CM | POA: Diagnosis not present

## 2017-10-04 DIAGNOSIS — I2510 Atherosclerotic heart disease of native coronary artery without angina pectoris: Secondary | ICD-10-CM | POA: Diagnosis not present

## 2017-10-04 DIAGNOSIS — F17210 Nicotine dependence, cigarettes, uncomplicated: Secondary | ICD-10-CM | POA: Diagnosis not present

## 2017-10-04 DIAGNOSIS — E785 Hyperlipidemia, unspecified: Secondary | ICD-10-CM | POA: Diagnosis not present

## 2017-10-04 DIAGNOSIS — Z79899 Other long term (current) drug therapy: Secondary | ICD-10-CM | POA: Diagnosis not present

## 2017-10-04 DIAGNOSIS — I6523 Occlusion and stenosis of bilateral carotid arteries: Secondary | ICD-10-CM | POA: Diagnosis not present

## 2017-10-04 DIAGNOSIS — F329 Major depressive disorder, single episode, unspecified: Secondary | ICD-10-CM | POA: Diagnosis not present

## 2017-10-04 DIAGNOSIS — Z7982 Long term (current) use of aspirin: Secondary | ICD-10-CM | POA: Diagnosis not present

## 2017-10-04 DIAGNOSIS — I10 Essential (primary) hypertension: Secondary | ICD-10-CM | POA: Diagnosis not present

## 2017-10-04 DIAGNOSIS — E119 Type 2 diabetes mellitus without complications: Secondary | ICD-10-CM | POA: Diagnosis not present

## 2017-10-04 NOTE — Progress Notes (Signed)
Donald Jennings 61 y.o. male DOB: Oct 08, 1956 MRN: [id number]      Nutrition Note  1. Stented coronary artery 08/04/2017 DES pLAD, d RCA    Labs:  Lipid Panel     Component Value Date/Time   CHOL 96 (L) 09/13/2017 0930   TRIG 91 09/13/2017 0930   HDL 33 (L) 09/13/2017 0930   CHOLHDL 2.9 09/13/2017 0930   CHOLHDL 2.9 08/14/2016 1155   VLDL 25 08/14/2016 1155   LDLCALC 45 09/13/2017 0930   LDLDIRECT 167.4 03/21/2009 1030  Nutrition Note Spoke with pt. Nutrition plan and survey reviewed with pt. Pt is following Step 1 of the Therapeutic Lifestyle Changes diet. Pt is a diet-controlled diabetic according to EMR. Pt denies DM dx stating "the doctor told me I was borderline." Pt able to name foods that affect blood glucose stating "carbohydrates." Pt reports he has read through the nutrition class materials. Pt expressed understanding of the information reviewed.   Nutrition Diagnosis ? Food-and nutrition-related knowledge deficit related to lack of exposure to information as related to diagnosis of: ? CVD ? DM  Nutrition Intervention ? Pt's individual nutrition plan and goals reviewed with pt. ?  Benefits of adopting Therapeutic Lifestyle Changes discussed when Medficts reviewed.    Nutrition Goal(s):  ? Pt to identify and limit food sources of saturated fat, trans fat, and sodium ? Pt able to name foods that affect blood glucose   Plan:  Pt to attend nutrition classes ? Nutrition I ? Nutrition II ? Portion Distortion ? Diabetes Blitz ? Diabetes Q & A Will provide client-centered nutrition education as part of interdisciplinary care.   Monitor and evaluate progress toward nutrition goal with team.  Derek Mound, M.Ed, RD, LDN, CDE 10/04/2017 3:30 PM

## 2017-10-06 ENCOUNTER — Encounter (HOSPITAL_COMMUNITY)
Admission: RE | Admit: 2017-10-06 | Discharge: 2017-10-06 | Disposition: A | Discharge status: Home / Self Care | Payer: Federal, State, Local not specified - PPO | Source: Ambulatory Visit | Attending: Cardiology | Admitting: Cardiology

## 2017-10-06 DIAGNOSIS — K219 Gastro-esophageal reflux disease without esophagitis: Secondary | ICD-10-CM | POA: Diagnosis not present

## 2017-10-06 DIAGNOSIS — I10 Essential (primary) hypertension: Secondary | ICD-10-CM | POA: Diagnosis not present

## 2017-10-06 DIAGNOSIS — E785 Hyperlipidemia, unspecified: Secondary | ICD-10-CM | POA: Diagnosis not present

## 2017-10-06 DIAGNOSIS — F17210 Nicotine dependence, cigarettes, uncomplicated: Secondary | ICD-10-CM | POA: Diagnosis not present

## 2017-10-06 DIAGNOSIS — F329 Major depressive disorder, single episode, unspecified: Secondary | ICD-10-CM | POA: Diagnosis not present

## 2017-10-06 DIAGNOSIS — I6523 Occlusion and stenosis of bilateral carotid arteries: Secondary | ICD-10-CM | POA: Diagnosis not present

## 2017-10-06 DIAGNOSIS — Z955 Presence of coronary angioplasty implant and graft: Secondary | ICD-10-CM

## 2017-10-06 DIAGNOSIS — I2510 Atherosclerotic heart disease of native coronary artery without angina pectoris: Secondary | ICD-10-CM | POA: Diagnosis not present

## 2017-10-06 DIAGNOSIS — Z79899 Other long term (current) drug therapy: Secondary | ICD-10-CM | POA: Diagnosis not present

## 2017-10-06 DIAGNOSIS — Z7902 Long term (current) use of antithrombotics/antiplatelets: Secondary | ICD-10-CM | POA: Diagnosis not present

## 2017-10-06 DIAGNOSIS — Z7982 Long term (current) use of aspirin: Secondary | ICD-10-CM | POA: Diagnosis not present

## 2017-10-06 DIAGNOSIS — E119 Type 2 diabetes mellitus without complications: Secondary | ICD-10-CM | POA: Diagnosis not present

## 2017-10-08 ENCOUNTER — Encounter (HOSPITAL_COMMUNITY): Payer: Federal, State, Local not specified - PPO

## 2017-10-11 ENCOUNTER — Encounter (HOSPITAL_COMMUNITY)
Admission: RE | Admit: 2017-10-11 | Discharge: 2017-10-11 | Disposition: A | Discharge status: Home / Self Care | Payer: Federal, State, Local not specified - PPO | Source: Ambulatory Visit | Attending: Cardiology | Admitting: Cardiology

## 2017-10-11 VITALS — BP 100/62 | HR 75 | Ht 68.25 in | Wt 157.6 lb

## 2017-10-11 DIAGNOSIS — F329 Major depressive disorder, single episode, unspecified: Secondary | ICD-10-CM | POA: Diagnosis not present

## 2017-10-11 DIAGNOSIS — I6523 Occlusion and stenosis of bilateral carotid arteries: Secondary | ICD-10-CM | POA: Diagnosis not present

## 2017-10-11 DIAGNOSIS — Z7982 Long term (current) use of aspirin: Secondary | ICD-10-CM | POA: Diagnosis not present

## 2017-10-11 DIAGNOSIS — I2510 Atherosclerotic heart disease of native coronary artery without angina pectoris: Secondary | ICD-10-CM | POA: Diagnosis not present

## 2017-10-11 DIAGNOSIS — I10 Essential (primary) hypertension: Secondary | ICD-10-CM | POA: Diagnosis not present

## 2017-10-11 DIAGNOSIS — F17210 Nicotine dependence, cigarettes, uncomplicated: Secondary | ICD-10-CM | POA: Diagnosis not present

## 2017-10-11 DIAGNOSIS — Z955 Presence of coronary angioplasty implant and graft: Secondary | ICD-10-CM | POA: Diagnosis not present

## 2017-10-11 DIAGNOSIS — E119 Type 2 diabetes mellitus without complications: Secondary | ICD-10-CM | POA: Diagnosis not present

## 2017-10-11 DIAGNOSIS — E785 Hyperlipidemia, unspecified: Secondary | ICD-10-CM | POA: Diagnosis not present

## 2017-10-11 DIAGNOSIS — K219 Gastro-esophageal reflux disease without esophagitis: Secondary | ICD-10-CM | POA: Diagnosis not present

## 2017-10-11 DIAGNOSIS — Z7902 Long term (current) use of antithrombotics/antiplatelets: Secondary | ICD-10-CM | POA: Diagnosis not present

## 2017-10-11 DIAGNOSIS — Z79899 Other long term (current) drug therapy: Secondary | ICD-10-CM | POA: Diagnosis not present

## 2017-10-13 ENCOUNTER — Encounter (HOSPITAL_COMMUNITY): Payer: Federal, State, Local not specified - PPO

## 2017-10-15 ENCOUNTER — Encounter (HOSPITAL_COMMUNITY): Payer: Federal, State, Local not specified - PPO

## 2017-10-15 ENCOUNTER — Telehealth: Payer: Self-pay | Admitting: Cardiology

## 2017-10-15 NOTE — Telephone Encounter (Signed)
New message  Patient states he has felt  dizzy today.   Marland KitchenSTAT if patient feels like he/she is going to faint   1) Are you dizzy now? No  2) Do you feel faint or have you passed out?  No  3) Do you have any other symptoms? lightheaded  4) Have you checked your HR and BP (record if available)? no

## 2017-10-15 NOTE — Telephone Encounter (Signed)
Called patient about his dizziness. Patient stated this has happen twice this week. Right now he feels fine and his BP is 122/68 and HR is 80. Patient stated he has not been eating much in the mornings. Patient stated after he had food and something to drink he felt better. Informed patient to check his BP and keep a record. If his SBP is below 110 or his BP is higher than 140/90 to give our office a call. Encouraged patient to take his medications with food and he should make sure he is not skipping meals and staying hydrated. Patient verbalized understanding. Will forward to Dr. Radford Pax for further advisement.

## 2017-10-16 NOTE — Telephone Encounter (Signed)
Agree with recommendations.  

## 2017-10-18 ENCOUNTER — Encounter (HOSPITAL_COMMUNITY): Payer: Federal, State, Local not specified - PPO

## 2017-10-20 ENCOUNTER — Encounter (HOSPITAL_COMMUNITY): Payer: Federal, State, Local not specified - PPO

## 2017-10-25 ENCOUNTER — Encounter (HOSPITAL_COMMUNITY): Payer: Federal, State, Local not specified - PPO

## 2017-10-26 ENCOUNTER — Encounter: Payer: Self-pay | Admitting: Physician Assistant

## 2017-10-27 ENCOUNTER — Telehealth (HOSPITAL_COMMUNITY): Payer: Self-pay | Admitting: *Deleted

## 2017-10-27 ENCOUNTER — Encounter (HOSPITAL_COMMUNITY): Payer: Self-pay | Admitting: *Deleted

## 2017-10-27 ENCOUNTER — Encounter (HOSPITAL_COMMUNITY): Payer: Federal, State, Local not specified - PPO

## 2017-10-27 DIAGNOSIS — Z955 Presence of coronary angioplasty implant and graft: Secondary | ICD-10-CM

## 2017-10-27 NOTE — Progress Notes (Signed)
Discharge Progress Report  Patient Details  Name: Donald Jennings [facility] MRN: [id number] Date of Birth: Apr 02, 1956 Referring Provider:     [facility] from 08/24/2017 in [place]  Referring Provider  Fransico Him MD       Number of Visits: 14  Reason for Discharge:  Early Exit:  Back to work  Smoking History:  Social History   Tobacco Use  Smoking Status Current Every Day Smoker  . Packs/day: 1.00  . Years: 38.50  . Pack years: 38.50  . Types: Cigarettes  Smokeless Tobacco Never Used  Tobacco Comment   Currently  smokes 1/2 ppd and is considering Chantix    Diagnosis:  Stented coronary artery 08/04/2017 DES pLAD, d RCA  ADL UCSD:   Initial Exercise Prescription:   Discharge Exercise Prescription (Final Exercise Prescription Changes): Exercise Prescription Changes - 10/11/17 1450      Response to Exercise   Blood Pressure (Admit)  100/62    Blood Pressure (Exercise)  128/72    Blood Pressure (Exit)  100/65    Heart Rate (Admit)  75 bpm    Heart Rate (Exercise)  129 bpm    Heart Rate (Exit)  87 bpm    Rating of Perceived Exertion (Exercise)  13    Symptoms  Pt c/o low back stiffness. Pt seeing orthopedic doctor about this.    Duration  Continue with 30 min of aerobic exercise without signs/symptoms of physical distress.    Intensity  THRR unchanged      Progression   Progression  Continue to progress workloads to maintain intensity without signs/symptoms of physical distress.    Average METs  4.8      Resistance Training   Training Prescription  Yes    Weight  4lbs    Reps  10-15    Time  10 Minutes      Interval Training   Interval Training  No      Treadmill   MPH  3.5    Grade  4    Minutes  10    METs  5.61      Bike   Level  1.8    Minutes  10    METs  5.74      NuStep   Level  4    SPM  80    Minutes  10    METs  3.1      Home Exercise Plan   Plans to continue exercise at  Home  (comment)    Frequency  Add 2 additional days to program exercise sessions.    Initial Home Exercises Provided  09/03/17       Functional Capacity:   Psychological, QOL, Others - Outcomes: PHQ 2/9: Depression screen PHQ 2/9 08/30/2017  Decreased Interest 0  Down, Depressed, Hopeless 0  PHQ - 2 Score 0    Quality of Life: Quality of Life - 09/16/17 1545      Quality of Life Scores   Health/Function Pre  -- Slightly satisfied with health due to recent stent placement    GLOBAL Pre  -- Maxton says he has been feeling better since he has been participating in phase 2 cardiac rehab.       Personal Goals: Goals established at orientation with interventions provided to work toward goal.    Personal Goals Discharge: Goals and Risk Factor Review    Row Name 09/07/17 1603 09/30/17 1437  Core Components/Risk Factors/Patient Goals Review   Personal Goals Review  Lipids;Hypertension;Tobacco Cessation  Lipids;Hypertension;Tobacco Cessation      Review  - Faraaz continues to smoke, I sent a message to Dr Radford Pax to see if she can prescribe Chantx to help with smoking cessatoin per patients request  - Yannick has the presripttion for chantix but has not started taking it yest. Will continue to encourage smoking cessation.      Expected Outcomes  Tavari will continue to take his medicaitons as presribed for HTN and Hyperlipidemia and exercise at cardiac rehab  Dontavis will continue to take his medicaitons as presribed for HTN and Hyperlipidemia and exercise at cardiac rehab         Exercise Goals and Review:   Nutrition & Weight - Outcomes:  Post Biometrics - 10/11/17 1450       Post  Biometrics   Height  5' 8.25" (1.734 m)    Weight  157 lb 10.1 oz (71.5 kg)    BMI (Calculated)  23.78       Nutrition:   Nutrition Discharge:   Education Questionnaire Score:   Neeraj attended 14 exercise sessions between 08/24/17-10/11/17. Monico's attendance was fair. Kallen had to stop early due to his  work obligations. Smoking cessation was encouraged. Garett did well with exercise when he was able to attend.Barnet Pall, RN,BSN 11/15/2017 5:14 PM

## 2017-10-28 ENCOUNTER — Encounter: Payer: Self-pay | Admitting: Physician Assistant

## 2017-10-29 ENCOUNTER — Encounter (HOSPITAL_COMMUNITY): Payer: Federal, State, Local not specified - PPO

## 2017-11-01 ENCOUNTER — Encounter (HOSPITAL_COMMUNITY): Payer: Federal, State, Local not specified - PPO

## 2017-11-01 ENCOUNTER — Ambulatory Visit: Payer: Federal, State, Local not specified - PPO | Admitting: Physician Assistant

## 2017-11-03 ENCOUNTER — Telehealth: Payer: Self-pay

## 2017-11-03 ENCOUNTER — Encounter (HOSPITAL_COMMUNITY): Payer: Federal, State, Local not specified - PPO

## 2017-11-03 NOTE — Telephone Encounter (Signed)
ERROR

## 2017-11-03 NOTE — Telephone Encounter (Signed)
I called [employer] (Pts insurance company listed on quantity exception that was faxed from [place]) clinical call center at [phone] and spoke with Alroy Dust. Per Alroy Dust this quantity exception for Pantoprazole has been approved from 10/04/2017 until 11/03/2018.  I have notified Thayer Headings at [facility] of approval.

## 2017-11-05 ENCOUNTER — Encounter (HOSPITAL_COMMUNITY): Payer: Federal, State, Local not specified - PPO

## 2017-11-08 ENCOUNTER — Encounter (HOSPITAL_COMMUNITY): Payer: Federal, State, Local not specified - PPO

## 2017-11-09 ENCOUNTER — Other Ambulatory Visit: Payer: Self-pay | Admitting: Internal Medicine

## 2017-11-09 DIAGNOSIS — I10 Essential (primary) hypertension: Secondary | ICD-10-CM

## 2017-11-09 NOTE — Addendum Note (Signed)
Encounter addended by: Sol Passer on: 11/09/2017 2:03 PM  Actions taken: Flowsheet data copied forward, Visit Navigator Flowsheet section accepted, Vitals modified

## 2017-11-10 ENCOUNTER — Encounter (HOSPITAL_COMMUNITY): Payer: Federal, State, Local not specified - PPO

## 2017-11-12 ENCOUNTER — Encounter (HOSPITAL_COMMUNITY): Payer: Federal, State, Local not specified - PPO

## 2017-11-15 ENCOUNTER — Encounter (HOSPITAL_COMMUNITY): Payer: Federal, State, Local not specified - PPO

## 2017-11-17 ENCOUNTER — Encounter (HOSPITAL_COMMUNITY): Payer: Federal, State, Local not specified - PPO

## 2017-11-17 ENCOUNTER — Telehealth: Payer: Self-pay | Admitting: Cardiology

## 2017-11-17 DIAGNOSIS — M546 Pain in thoracic spine: Secondary | ICD-10-CM | POA: Diagnosis not present

## 2017-11-17 NOTE — Telephone Encounter (Signed)
Spoke with staff at [facility], [place] was gone for the day. She states that patient was prescribed some prednisone for back pain today. Informed her that I would send to Dr. Radford Pax for review.

## 2017-11-17 NOTE — Telephone Encounter (Signed)
That should not be a problem

## 2017-11-17 NOTE — Telephone Encounter (Signed)
Informed Willisa at [facility] that per Dr. Radford Pax okay to start prednisone. She verbalized understanding and thanked me for the call.

## 2017-11-17 NOTE — Telephone Encounter (Signed)
NEw MEssage  Arbie Cookey PA from [place] [facility] call requesting to speak with RN to see if it would be okay to put pt on prednisone paper for thoracic pain for 6 days. Please call back to discuss

## 2017-11-18 DIAGNOSIS — M5489 Other dorsalgia: Secondary | ICD-10-CM | POA: Diagnosis not present

## 2017-11-18 DIAGNOSIS — Z1389 Encounter for screening for other disorder: Secondary | ICD-10-CM | POA: Diagnosis not present

## 2017-11-18 DIAGNOSIS — E119 Type 2 diabetes mellitus without complications: Secondary | ICD-10-CM | POA: Diagnosis not present

## 2017-11-18 DIAGNOSIS — E7849 Other hyperlipidemia: Secondary | ICD-10-CM | POA: Diagnosis not present

## 2017-11-18 DIAGNOSIS — Z9861 Coronary angioplasty status: Secondary | ICD-10-CM | POA: Diagnosis not present

## 2017-11-19 ENCOUNTER — Encounter (HOSPITAL_COMMUNITY): Payer: Federal, State, Local not specified - PPO

## 2017-11-22 ENCOUNTER — Encounter (HOSPITAL_COMMUNITY): Payer: Federal, State, Local not specified - PPO

## 2017-11-24 ENCOUNTER — Encounter (HOSPITAL_COMMUNITY): Payer: Federal, State, Local not specified - PPO

## 2017-11-26 ENCOUNTER — Encounter (HOSPITAL_COMMUNITY): Payer: Federal, State, Local not specified - PPO

## 2017-11-29 ENCOUNTER — Encounter (HOSPITAL_COMMUNITY): Payer: Federal, State, Local not specified - PPO

## 2017-12-01 ENCOUNTER — Encounter (HOSPITAL_COMMUNITY): Payer: Federal, State, Local not specified - PPO

## 2017-12-29 DIAGNOSIS — M25511 Pain in right shoulder: Secondary | ICD-10-CM | POA: Diagnosis not present

## 2018-01-19 ENCOUNTER — Other Ambulatory Visit: Payer: Self-pay | Admitting: Internal Medicine

## 2018-01-19 DIAGNOSIS — E559 Vitamin D deficiency, unspecified: Secondary | ICD-10-CM

## 2018-02-08 ENCOUNTER — Other Ambulatory Visit: Payer: Self-pay | Admitting: Internal Medicine

## 2018-02-08 DIAGNOSIS — E559 Vitamin D deficiency, unspecified: Secondary | ICD-10-CM

## 2018-02-09 NOTE — Telephone Encounter (Signed)
LOV 2017. Pt will need to see PCP before was can send in this prescription.

## 2018-02-09 NOTE — Telephone Encounter (Signed)
Called patient and informed. He said that he will check his schedule and call us back.

## 2018-03-30 IMAGING — DX DG CHEST 2V
2 series · 2 of 2 positions shown · non-contrast
Comparison: 08/13/2015

CLINICAL DATA: Cough, sore throat, cold.

EXAM:
CHEST  2 VIEW

[chest pa]
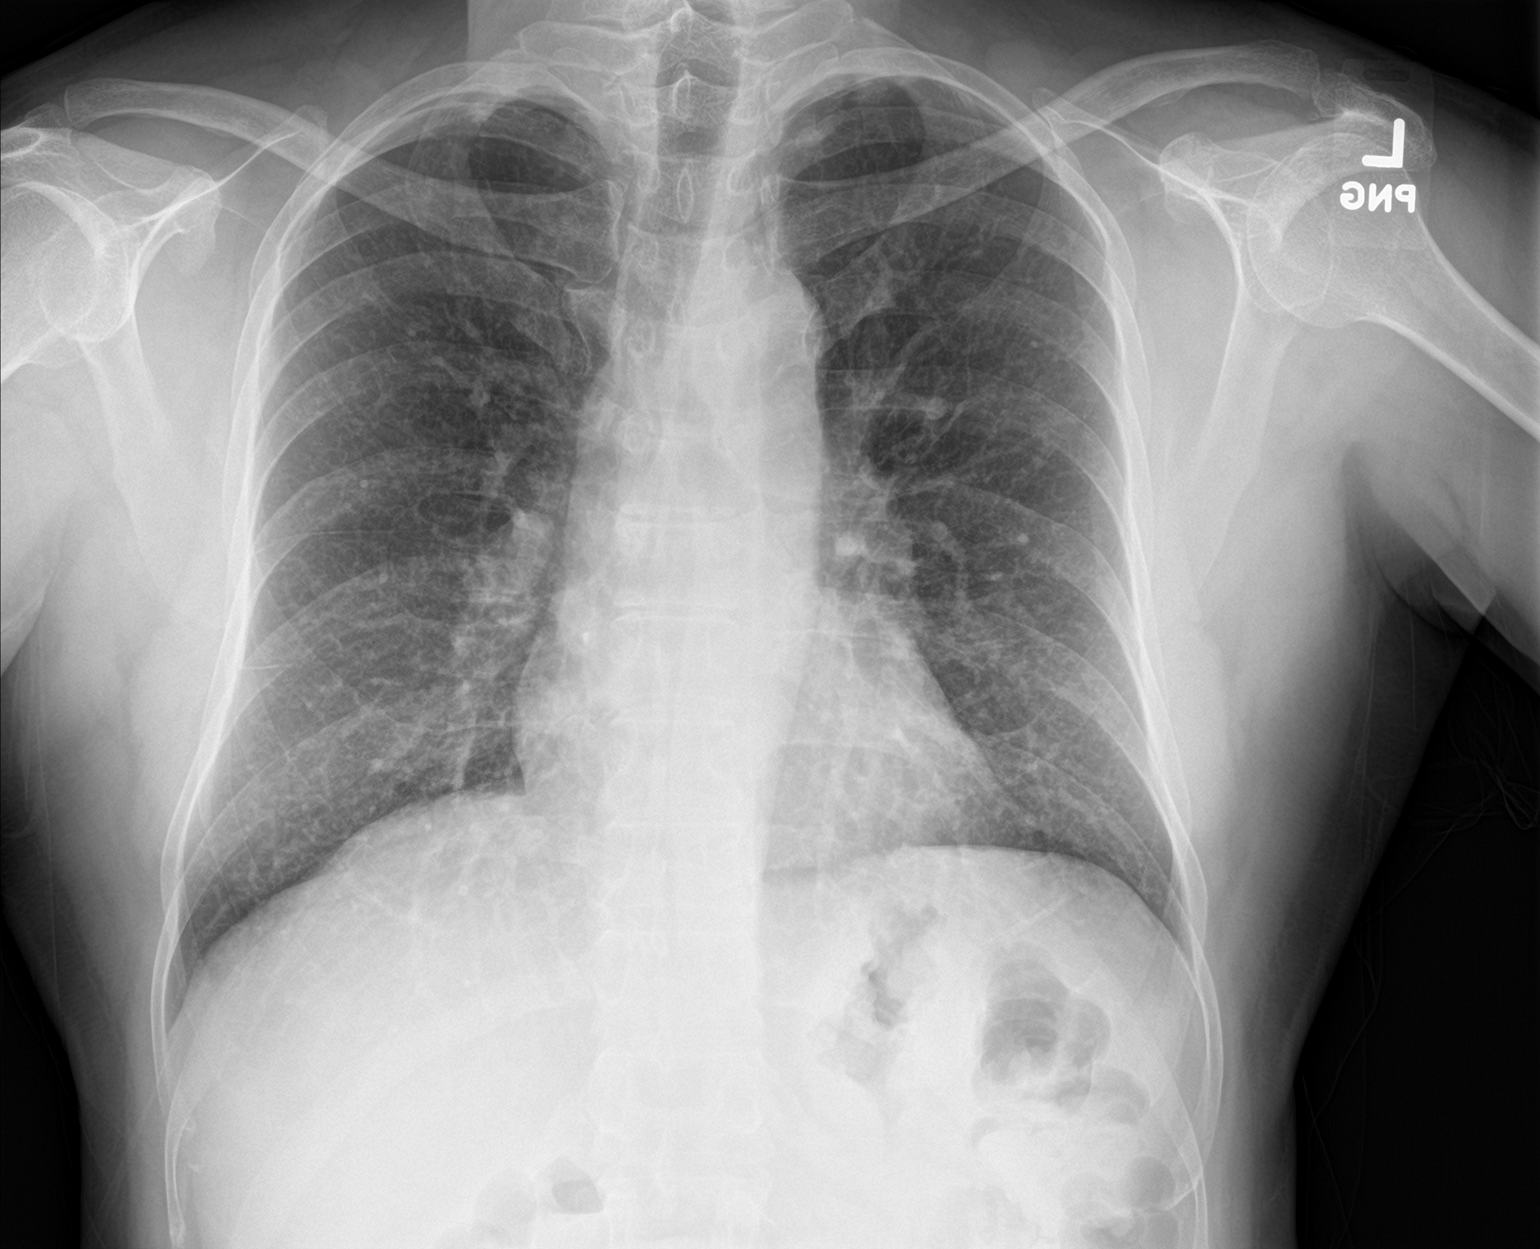

[chest lat]
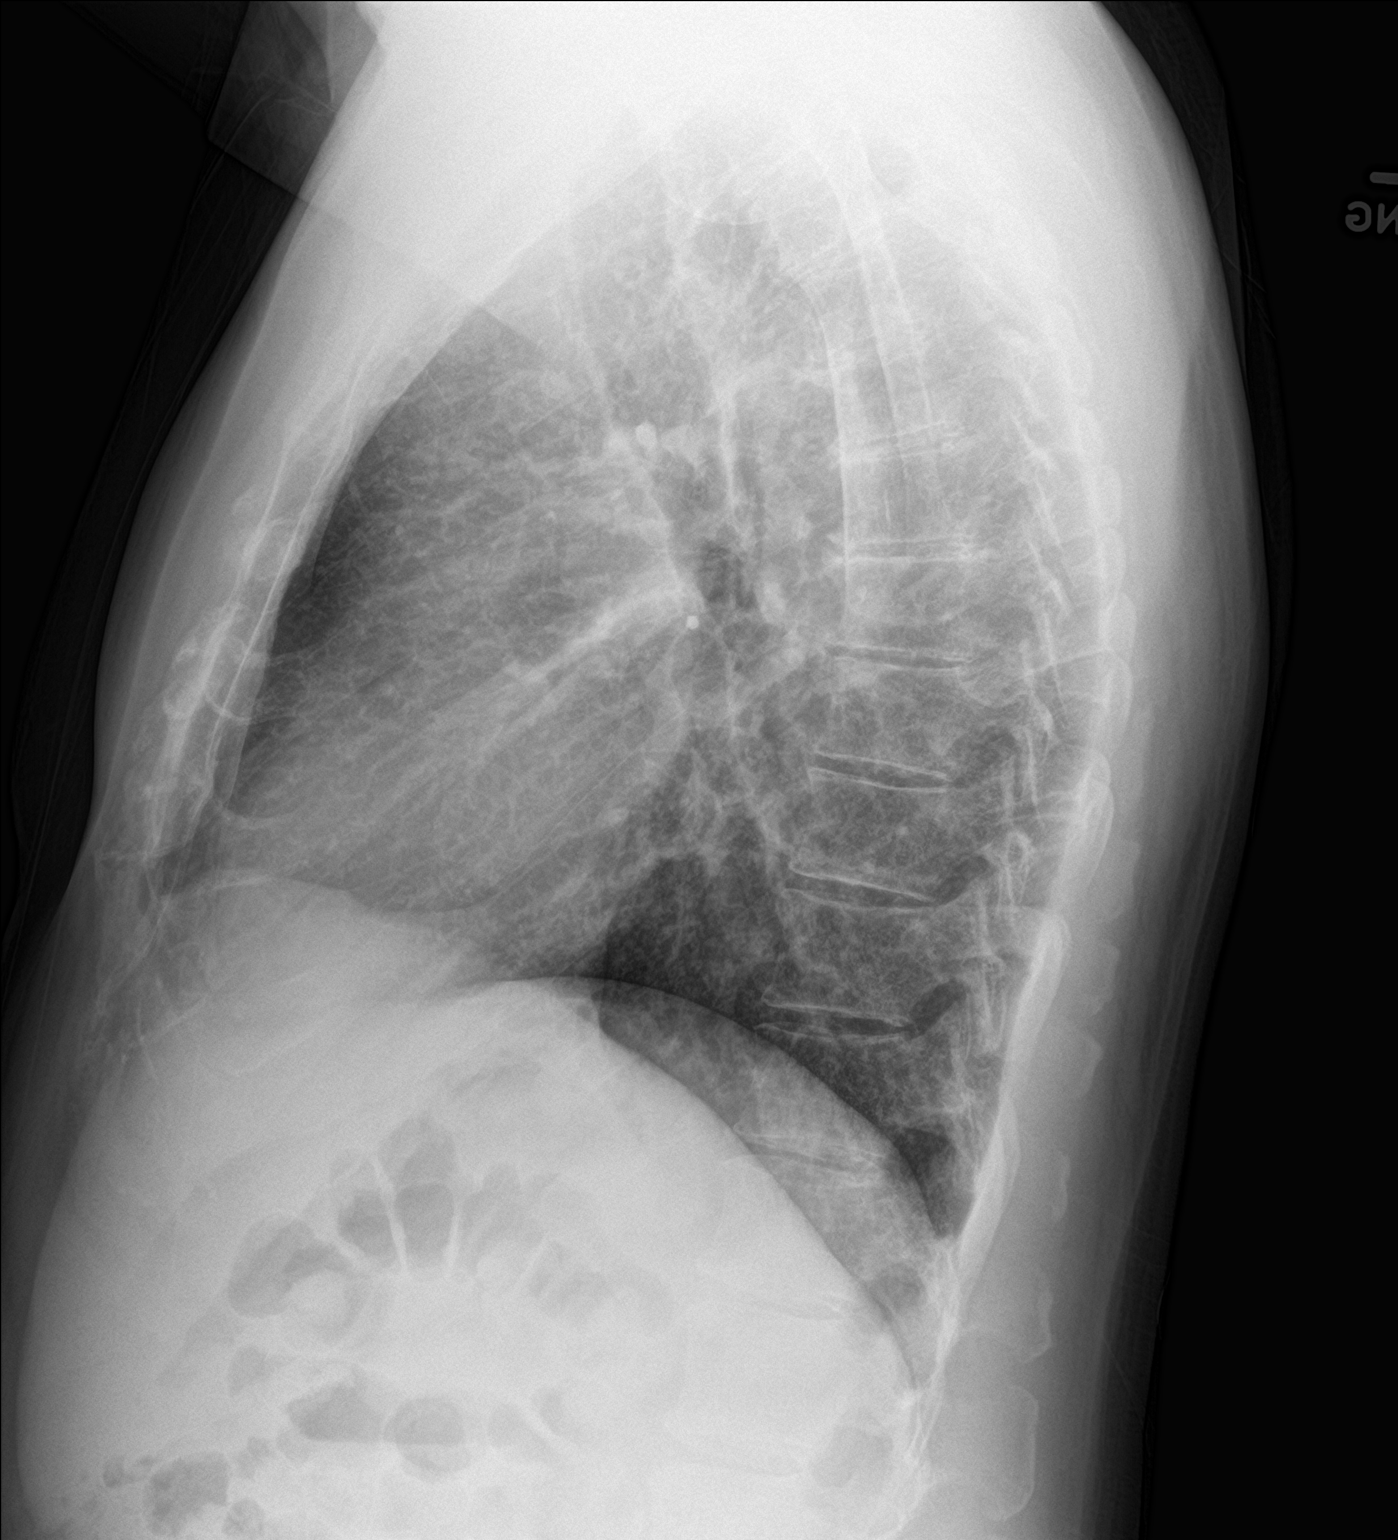

[2 of 2 positions shown; findings below may reference images not displayed]

FINDINGS: Heart and mediastinal contours are within normal limits. No focal
opacities or effusions. No acute bony abnormality.
IMPRESSION: No active cardiopulmonary disease.

## 2018-04-22 ENCOUNTER — Other Ambulatory Visit: Payer: Self-pay | Admitting: Cardiology

## 2018-05-01 ENCOUNTER — Other Ambulatory Visit: Payer: Self-pay | Admitting: Internal Medicine

## 2018-05-01 DIAGNOSIS — E118 Type 2 diabetes mellitus with unspecified complications: Secondary | ICD-10-CM

## 2018-05-01 DIAGNOSIS — I10 Essential (primary) hypertension: Secondary | ICD-10-CM

## 2018-05-26 ENCOUNTER — Ambulatory Visit: Payer: Federal, State, Local not specified - PPO | Admitting: Cardiology

## 2018-05-26 ENCOUNTER — Encounter: Payer: Self-pay | Admitting: Cardiology

## 2018-05-26 ENCOUNTER — Encounter: Payer: Self-pay | Admitting: *Deleted

## 2018-05-26 VITALS — BP 128/62 | HR 75 | Ht 68.0 in | Wt 154.0 lb

## 2018-05-26 DIAGNOSIS — E118 Type 2 diabetes mellitus with unspecified complications: Secondary | ICD-10-CM | POA: Diagnosis not present

## 2018-05-26 DIAGNOSIS — I10 Essential (primary) hypertension: Secondary | ICD-10-CM | POA: Diagnosis not present

## 2018-05-26 DIAGNOSIS — I2510 Atherosclerotic heart disease of native coronary artery without angina pectoris: Secondary | ICD-10-CM

## 2018-05-26 DIAGNOSIS — R6884 Jaw pain: Secondary | ICD-10-CM

## 2018-05-26 MED ORDER — LOSARTAN POTASSIUM 100 MG PO TABS: 100.0000 mg | ORAL_TABLET | Freq: Every day | ORAL | 3 refills | Status: DC

## 2018-05-26 MED ORDER — METOPROLOL SUCCINATE ER 25 MG PO TB24: 25.0000 mg | ORAL_TABLET | Freq: Every day | ORAL | 3 refills | Status: DC

## 2018-05-26 NOTE — Progress Notes (Signed)
05/26/2018 [place]   1956/04/08  [id number]  Primary Physician Janith Lima, MD Primary Cardiologist: Dr. Radford Pax  Reason for Visit/CC: F/u for CAD; jaw pain  HPI:  62 y/o male with h/o CAD, HTN, HLD and prior h/o TIA, who presents to clinic today for f/u for CAD. He was admitted to [facility] in Sep. 2018 for [facility] given symptoms concerning for unstable angina. He underwent LHC with Dr. Burt Knack on 08/04/17 and found to have total occlusion of the mLAD with left to left collaterals treated with PCI/DES x1, and 75% lesion in the distal RCA treated with PCI/DES x1. Normal LV function and LVEDP. He was placed on  DAPT with ASA/plavix.  Also discharged home on a statin, beta blocker and ARB. I saw him for post hospital f/u and he was doing well right after w/o recurrent symptoms. He was instructed to f/u 4 months later w/ Dr. Radford Pax but failed to do so.   He now presents back to clinic given recent symptoms of jaw/gum pain. Occurred 2 days ago at rest. Slight "twinge" lasting only a few secs. He had jaw pain last Sept prior to stent placement, but he also had associated CP, exertional dyspnea and fatigue at that time and those symptoms are not present now. He is currently w/o jaw pain. He reports full med compliance. BP is well controlled at 128/62. HR 75. He continues to smoke. Smoker for over 40 years. Smokes 1ppd. Not interested in quitting at this time.    Cardiac Studies Procedures   CORONARY STENT INTERVENTION  LEFT HEART CATH AND CORONARY ANGIOGRAPHY  Conclusion   1. Total occlusion of the mid LAD with left to left collaterals present. 2. Severe stenosis of the distal RCA with diffuse plaquing of the entire RCA and otherwise nonobstructive disease. 3. Patency of the left main and left circumflex with mild nonobstructive disease in the intermediate branch 4. Normal LV systolic function with normal LVEDP 5. Successful 2 vessel PCI with stenting of the chronically occluded mid LAD and stenting of  the severely stenotic distal RCA lesion  Recommend: Dual antiplatelet therapy with aspirin and clopidogrel for a minimum of 12 months. Aggressive risk reduction measures.     Current Meds  Medication Sig  . aspirin EC 81 MG tablet Take 1 tablet (81 mg total) by mouth daily.  . clopidogrel (PLAVIX) 75 MG tablet TAKE 1 TABLET (75 MG TOTAL) BY MOUTH DAILY WITH BREAKFAST.  [name] diclofenac sodium (VOLTAREN) 1 % GEL Apply 1 application topically as needed for pain.  [name] lidocaine (LIDODERM) 5 % Place 1 patch onto the skin daily. Remove & Discard patch within 12 hours or as directed by MD  . losartan (COZAAR) 100 MG tablet TAKE 1 TABLET (100 MG TOTAL) BY MOUTH DAILY.  . metoprolol succinate (TOPROL-XL) 25 MG 24 hr tablet TAKE 1 TABLET (25 MG TOTAL) BY MOUTH DAILY.  . Multiple Vitamins-Minerals (MULTIVITAMIN ADULT) TABS Take 1 tablet by mouth daily.  . nitroGLYCERIN (NITROSTAT) 0.4 MG SL tablet Place 1 tablet (0.4 mg total) under the tongue every 5 (five) minutes as needed.  . pantoprazole (PROTONIX) 40 MG tablet TAKE 1 TABLET BY MOUTH EVERY DAY  . rosuvastatin (CRESTOR) 40 MG tablet Take 1 tablet (40 mg total) by mouth daily.  . varenicline (CHANTIX CONTINUING MONTH PAK) 1 MG tablet Take 1 tablet (1 mg total) by mouth 2 (two) times daily.  . Vitamin D, Ergocalciferol, (DRISDOL) 50000 units CAPS capsule TAKE 1 CAPSULE (50,000 UNITS  TOTAL) BY MOUTH ONCE A WEEK.   No Known Allergies Past Medical History:  Diagnosis Date  . Abnormal thallium stress test 05/20/2016  . Amaurosis fugax 06/03/2016  . CAD (coronary artery disease)   . CAD (coronary artery disease), native coronary artery 07/27/2017   < 40% prox LAD, 50% mid LAD and 40% distal RCA  . Carotid artery stenosis 07/27/2017   1-39% bilateral  . CERVICAL RADICULOPATHY 07/02/2010   Qualifier: Diagnosis of  By: Ronnald Ramp MD, Arvid Right.   . Coronary artery disease with exertional angina ([place]) 08/04/2017  . DEPRESSION 03/21/2009   Qualifier: Diagnosis of  By:  Ronnald Ramp MD, Arvid Right.   . Diabetes mellitus without complication ([place])   . Dysphagia 05/18/2016  . Eczema, dyshidrotic 05/27/2015  . ELECTROCARDIOGRAM, ABNORMAL 03/21/2009  . Essential hypertension 06/19/2009       . GERD 06/19/2009   Qualifier: Diagnosis of  By: Ronnald Ramp MD, Arvid Right.   [name] HLD (hyperlipidemia)   . Hx of cardiovascular stress test    ETT-Myoview (03/2014):  No ischemia, EF 56%; Low Risk  . Hyperlipidemia LDL goal <70 06/03/2016  . HYPERTENSION 06/19/2009   Qualifier: Diagnosis of  By: Ronnald Ramp MD, Arvid Right.   [name] Lytic bone lesions on xray 02/22/2017  . Neuropathy of both feet 05/28/2015  . Routine general medical examination at a health care facility 04/04/2013  . SHOULDER INJURY, RIGHT 07/02/2010   Qualifier: Diagnosis of  By: Ronnald Ramp MD, Arvid Right.   . SOB (shortness of breath) 06/10/2016  . Solitary plasmacytoma of bone ([place]) 03/05/2017  . TOBACCO USE 03/21/2009   Qualifier: Diagnosis of  By: Ronnald Ramp MD, Arvid Right.   . Type 2 diabetes mellitus with manifestations ([place]) 03/27/2015  . Vertebral artery stenosis, asymptomatic 06/22/2016  . Vitamin D deficiency 04/04/2013   Family History  Problem Relation Age of Onset  . Diabetes Mother   . Arthritis Mother   . Heart disease Father   . Stroke Father   . Stroke Brother   . Cancer Neg Hx   . Early death Neg Hx   . Hypertension Neg Hx   . Hyperlipidemia Neg Hx   . Kidney disease Neg Hx    Past Surgical History:  Procedure Laterality Date  . CARDIAC CATHETERIZATION    . CATARACT EXTRACTION Bilateral    Left 2009 at [facility], Right 2004 in [employer]  . COLONOSCOPY  07/25/2015   Dr Carol Ada  . CORONARY STENT INTERVENTION N/A 08/04/2017   Procedure: CORONARY STENT INTERVENTION;  Surgeon: Sherren Mocha, MD;  Location: [place] [facility];  Service: Cardiovascular;  Laterality: N/A;  . INGUINAL HERNIA REPAIR Right 1986  . IR FLUORO GUIDED NEEDLE PLC ASPIRATION/INJECTION LOC  03/02/2017  . IR FLUORO GUIDED NEEDLE PLC ASPIRATION/INJECTION LOC   03/02/2017  . IR FLUORO GUIDED NEEDLE PLC ASPIRATION/INJECTION LOC  03/02/2017  . IR RADIOLOGIST EVAL & MGMT  03/01/2017  . LEFT HEART CATH AND CORONARY ANGIOGRAPHY N/A 08/04/2017   Procedure: LEFT HEART CATH AND CORONARY ANGIOGRAPHY;  Surgeon: Sherren Mocha, MD;  Location: [place] [facility];  Service: Cardiovascular;  Laterality: N/A;   Social History   Socioeconomic History  . Marital status: Married    Spouse name: Not on file  . Number of children: 1  . Years of education: Not on file  . Highest education level: Not on file  Occupational History  . Occupation: Scientist, clinical (histocompatibility and immunogenetics)  Social Needs  . Financial resource strain: Not on file  . Food insecurity:  Worry: Not on file    Inability: Not on file  . Transportation needs:    Medical: Not on file    Non-medical: Not on file  Tobacco Use  . Smoking status: Current Every Day Smoker    Packs/day: 1.00    Years: 38.50    Pack years: 38.50    Types: Cigarettes  . Smokeless tobacco: Never Used  . Tobacco comment: Currently  smokes 1/2 ppd and is considering Chantix  Substance and Sexual Activity  . Alcohol use: No  . Drug use: No  . Sexual activity: Not on file  Lifestyle  . Physical activity:    Days per week: Not on file    Minutes per session: Not on file  . Stress: Not on file  Relationships  . Social connections:    Talks on phone: Not on file    Gets together: Not on file    Attends religious service: Not on file    Active member of club or organization: Not on file    Attends meetings of clubs or organizations: Not on file    Relationship status: Not on file  . Intimate partner violence:    Fear of current or ex partner: Not on file    Emotionally abused: Not on file    Physically abused: Not on file    Forced sexual activity: Not on file  Other Topics Concern  . Not on file  Social History Narrative   Married 1 sone born [place]   2 caffeine drinks daily   Updated 07/29/2016      Review of Systems: General: negative for chills, fever, night sweats or weight changes.  Cardiovascular: negative for chest pain, dyspnea on exertion, edema, orthopnea, palpitations, paroxysmal nocturnal dyspnea or shortness of breath Dermatological: negative for rash Respiratory: negative for cough or wheezing Urologic: negative for hematuria Abdominal: negative for nausea, vomiting, diarrhea, bright red blood per rectum, melena, or hematemesis Neurologic: negative for visual changes, syncope, or dizziness All other systems reviewed and are otherwise negative except as noted above.   Physical Exam:  Blood pressure 128/62, pulse 75, height 5\' 8"  (1.727 m), weight 154 lb (69.9 kg), SpO2 96 %.  General appearance: alert, cooperative and no distress Neck: no carotid bruit and no JVD Lungs: clear to auscultation bilaterally Heart: regular rate and rhythm, S1, S2 normal, no murmur, click, rub or gallop Extremities: extremities normal, atraumatic, no cyanosis or edema Pulses: 2+ and symmetric Skin: Skin color, texture, turgor normal. No rashes or lesions Neurologic: Grossly normal  EKG not performed -- personally reviewed   ASSESSMENT AND PLAN:   1. CAD: He underwent LHC with Dr. Burt Knack on 08/04/17 and found to have total occlusion of the mLAD with left to left collaterals treated with PCI/DES x1, and 75% lesion in the distal RCA treated with PCI/DES x1. Normal LV function and LVEDP. He was placed on  DAPT with ASA/plavix. No chest pain or dyspnea, but had a recent episode of jaw pain. See plan below. Continue medical therapy.  2. Jaw Pain: will order exercise Myoview to r/o ischemia. Pt instructed to hold metoprolol prior to stress test.   3. HTN: controlled on current regimen.   4. Tobacco Abuse: He continues to smoke. Smoker for over 40 years. Smokes 1ppd. We discussed importance of smoking cessation but he does not seem interested in quitting at this time.   Follow-Up: Dr. Radford Pax  in 4 months, or sooner if stress test  is abnormal.   Nelida Gores, MHS [facility] 05/26/2018 11:13 AM

## 2018-05-26 NOTE — Patient Instructions (Addendum)
Medication Instructions:   Your physician recommends that you continue on your current medications as directed. Please refer to the Current Medication list given to you today.   If you need a refill on your cardiac medications before your next appointment, please call your pharmacy.  Labwork: NONE ORDERED  TODAY    Testing/Procedures:  Your physician has requested that you have en exercise stress myoview. For further information please visit [email or web address]. Please follow instruction sheet, as given.   Follow-Up:  3 TO 4  MONTHS WITH DR TURNER   Any Other Special Instructions Will Be Listed Below (If Applicable).

## 2018-05-30 DIAGNOSIS — G4709 Other insomnia: Secondary | ICD-10-CM | POA: Diagnosis not present

## 2018-05-30 DIAGNOSIS — E559 Vitamin D deficiency, unspecified: Secondary | ICD-10-CM | POA: Diagnosis not present

## 2018-05-30 DIAGNOSIS — E119 Type 2 diabetes mellitus without complications: Secondary | ICD-10-CM | POA: Diagnosis not present

## 2018-05-30 DIAGNOSIS — R1319 Other dysphagia: Secondary | ICD-10-CM | POA: Diagnosis not present

## 2018-05-30 DIAGNOSIS — I10 Essential (primary) hypertension: Secondary | ICD-10-CM | POA: Diagnosis not present

## 2018-06-08 ENCOUNTER — Encounter (HOSPITAL_COMMUNITY): Payer: Federal, State, Local not specified - PPO

## 2018-06-09 ENCOUNTER — Telehealth (HOSPITAL_COMMUNITY): Payer: Self-pay | Admitting: *Deleted

## 2018-06-09 NOTE — Telephone Encounter (Signed)
Left message on voicemail per DPR in reference to upcoming appointment scheduled on 06/13/18 with detailed instructions given per Myocardial Perfusion Study Information Sheet for the test. LM to arrive 15 minutes early, and that it is imperative to arrive on time for appointment to keep from having the test rescheduled. If you need to cancel or reschedule your appointment, please call the office within 24 hours of your appointment. Failure to do so may result in a cancellation of your appointment, and a $50 no show fee. Phone number given for call back for any questions. Kirstie Peri

## 2018-06-13 ENCOUNTER — Inpatient Hospital Stay
Admission: AD | Admit: 2018-06-13 | Payer: Federal, State, Local not specified - PPO | Source: Ambulatory Visit | Admitting: Cardiology

## 2018-06-13 ENCOUNTER — Ambulatory Visit (HOSPITAL_BASED_OUTPATIENT_CLINIC_OR_DEPARTMENT_OTHER): Payer: Federal, State, Local not specified - PPO

## 2018-06-13 ENCOUNTER — Other Ambulatory Visit: Payer: Self-pay

## 2018-06-13 ENCOUNTER — Inpatient Hospital Stay (HOSPITAL_COMMUNITY)
Admission: EM | Admit: 2018-06-13 | Discharge: 2018-06-14 | DRG: 287 | Disposition: A | Discharge status: Home / Self Care | Payer: Federal, State, Local not specified - PPO | Attending: Cardiology | Admitting: Cardiology

## 2018-06-13 ENCOUNTER — Encounter: Payer: Self-pay | Admitting: Cardiology

## 2018-06-13 ENCOUNTER — Ambulatory Visit (INDEPENDENT_AMBULATORY_CARE_PROVIDER_SITE_OTHER): Payer: Federal, State, Local not specified - PPO | Admitting: Cardiology

## 2018-06-13 ENCOUNTER — Emergency Department (HOSPITAL_COMMUNITY): Payer: Federal, State, Local not specified - PPO

## 2018-06-13 ENCOUNTER — Encounter (HOSPITAL_COMMUNITY): Payer: Self-pay | Admitting: Emergency Medicine

## 2018-06-13 DIAGNOSIS — Z7982 Long term (current) use of aspirin: Secondary | ICD-10-CM | POA: Diagnosis not present

## 2018-06-13 DIAGNOSIS — E1140 Type 2 diabetes mellitus with diabetic neuropathy, unspecified: Secondary | ICD-10-CM | POA: Diagnosis present

## 2018-06-13 DIAGNOSIS — I10 Essential (primary) hypertension: Secondary | ICD-10-CM | POA: Diagnosis present

## 2018-06-13 DIAGNOSIS — R9439 Abnormal result of other cardiovascular function study: Secondary | ICD-10-CM | POA: Diagnosis not present

## 2018-06-13 DIAGNOSIS — K219 Gastro-esophageal reflux disease without esophagitis: Secondary | ICD-10-CM | POA: Diagnosis not present

## 2018-06-13 DIAGNOSIS — R69 Illness, unspecified: Secondary | ICD-10-CM | POA: Diagnosis not present

## 2018-06-13 DIAGNOSIS — I25110 Atherosclerotic heart disease of native coronary artery with unstable angina pectoris: Secondary | ICD-10-CM | POA: Diagnosis not present

## 2018-06-13 DIAGNOSIS — I2583 Coronary atherosclerosis due to lipid rich plaque: Secondary | ICD-10-CM | POA: Diagnosis not present

## 2018-06-13 DIAGNOSIS — E785 Hyperlipidemia, unspecified: Secondary | ICD-10-CM | POA: Diagnosis present

## 2018-06-13 DIAGNOSIS — R079 Chest pain, unspecified: Secondary | ICD-10-CM | POA: Diagnosis not present

## 2018-06-13 DIAGNOSIS — I2510 Atherosclerotic heart disease of native coronary artery without angina pectoris: Secondary | ICD-10-CM

## 2018-06-13 DIAGNOSIS — Z8673 Personal history of transient ischemic attack (TIA), and cerebral infarction without residual deficits: Secondary | ICD-10-CM | POA: Diagnosis not present

## 2018-06-13 DIAGNOSIS — Z7902 Long term (current) use of antithrombotics/antiplatelets: Secondary | ICD-10-CM

## 2018-06-13 DIAGNOSIS — R6884 Jaw pain: Secondary | ICD-10-CM

## 2018-06-13 DIAGNOSIS — F17210 Nicotine dependence, cigarettes, uncomplicated: Secondary | ICD-10-CM | POA: Diagnosis present

## 2018-06-13 DIAGNOSIS — I25118 Atherosclerotic heart disease of native coronary artery with other forms of angina pectoris: Secondary | ICD-10-CM

## 2018-06-13 DIAGNOSIS — Z955 Presence of coronary angioplasty implant and graft: Secondary | ICD-10-CM | POA: Diagnosis not present

## 2018-06-13 DIAGNOSIS — I200 Unstable angina: Secondary | ICD-10-CM | POA: Diagnosis present

## 2018-06-13 DIAGNOSIS — Z833 Family history of diabetes mellitus: Secondary | ICD-10-CM | POA: Diagnosis not present

## 2018-06-13 DIAGNOSIS — E7800 Pure hypercholesterolemia, unspecified: Secondary | ICD-10-CM | POA: Diagnosis not present

## 2018-06-13 HISTORY — DX: Abnormal result of other cardiovascular function study: R94.39

## 2018-06-13 LAB — CBC WITH DIFFERENTIAL/PLATELET
ABS IMMATURE GRANULOCYTES: 0 10*3/uL (ref 0.0–0.1)
BASOS ABS: 0.1 10*3/uL (ref 0.0–0.1)
Basophils Relative: 1 %
Eosinophils Absolute: 0.2 10*3/uL (ref 0.0–0.7)
Eosinophils Relative: 3 %
HEMATOCRIT: 44.2 % (ref 39.0–52.0)
HEMOGLOBIN: 14 g/dL (ref 13.0–17.0)
Immature Granulocytes: 0 %
LYMPHS ABS: 2.4 10*3/uL (ref 0.7–4.0)
LYMPHS PCT: 31 %
MCH: 28 pg (ref 26.0–34.0)
MCHC: 31.7 g/dL (ref 30.0–36.0)
MCV: 88.4 fL (ref 78.0–100.0)
MONO ABS: 0.6 10*3/uL (ref 0.1–1.0)
MONOS PCT: 8 %
NEUTROS ABS: 4.4 10*3/uL (ref 1.7–7.7)
Neutrophils Relative %: 57 %
Platelets: 200 10*3/uL (ref 150–400)
RBC: 5 MIL/uL (ref 4.22–5.81)
RDW: 13.5 % (ref 11.5–15.5)
WBC: 7.6 10*3/uL (ref 4.0–10.5)

## 2018-06-13 LAB — HEPARIN LEVEL (UNFRACTIONATED): Heparin Unfractionated: 0.21 IU/mL — ABNORMAL LOW (ref 0.30–0.70)

## 2018-06-13 LAB — I-STAT TROPONIN, ED: Troponin i, poc: 0.01 ng/mL (ref 0.00–0.08)

## 2018-06-13 LAB — BASIC METABOLIC PANEL
Anion gap: 8 (ref 5–15)
BUN: 16 mg/dL (ref 8–23)
CHLORIDE: 105 mmol/L (ref 98–111)
CO2: 26 mmol/L (ref 22–32)
Calcium: 9.1 mg/dL (ref 8.9–10.3)
Creatinine, Ser: 0.92 mg/dL (ref 0.61–1.24)
GFR calc Af Amer: >60 mL/min (ref >60)
GFR calc non Af Amer: >60 mL/min (ref >60)
GLUCOSE: 125 mg/dL — AB (ref 70–99)
POTASSIUM: 3.7 mmol/L (ref 3.5–5.1)
Sodium: 139 mmol/L (ref 135–145)

## 2018-06-13 LAB — CBC
HEMATOCRIT: 41.6 % (ref 39.0–52.0)
HEMOGLOBIN: 13.2 g/dL (ref 13.0–17.0)
MCH: 27.7 pg (ref 26.0–34.0)
MCHC: 31.7 g/dL (ref 30.0–36.0)
MCV: 87.4 fL (ref 78.0–100.0)
Platelets: 197 10*3/uL (ref 150–400)
RBC: 4.76 MIL/uL (ref 4.22–5.81)
RDW: 13.4 % (ref 11.5–15.5)
WBC: 7.8 10*3/uL (ref 4.0–10.5)

## 2018-06-13 LAB — MYOCARDIAL PERFUSION IMAGING
CHL CUP NUCLEAR SSS: 10
CHL CUP RESTING HR STRESS: 66 {beats}/min
CSEPEDS: 0 s
CSEPEW: 11.7 METS
CSEPHR: 88 %
Exercise duration (min): 10 min
LV dias vol: 90 mL (ref 62–150)
LV sys vol: 37 mL
MPHR: 159 {beats}/min
Peak HR: 141 {beats}/min
RATE: 0.3
SDS: 1
SRS: 9
TID: 1.08

## 2018-06-13 LAB — PROTIME-INR
INR: 1.07
Prothrombin Time: 13.8 seconds (ref 11.4–15.2)

## 2018-06-13 LAB — HEMOGLOBIN A1C
Hgb A1c MFr Bld: 6.3 % — ABNORMAL HIGH (ref 4.8–5.6)
MEAN PLASMA GLUCOSE: 134.11 mg/dL

## 2018-06-13 MED ORDER — PANTOPRAZOLE SODIUM 40 MG PO TBEC: 40.0000 mg | DELAYED_RELEASE_TABLET | Freq: Every day | ORAL | Status: DC

## 2018-06-13 MED ORDER — ASPIRIN EC 81 MG PO TBEC
81.0000 mg | DELAYED_RELEASE_TABLET | Freq: Every day | ORAL | Status: DC
  Administered 2018-06-13: 81 mg via ORAL
  Filled 2018-06-13: qty 1

## 2018-06-13 MED ORDER — CLOPIDOGREL BISULFATE 75 MG PO TABS
75.0000 mg | ORAL_TABLET | Freq: Every day | ORAL | Status: DC
  Administered 2018-06-14: 75 mg via ORAL
  Filled 2018-06-13: qty 1

## 2018-06-13 MED ORDER — ACETAMINOPHEN 325 MG PO TABS: 650.0000 mg | ORAL_TABLET | ORAL | Status: DC | PRN

## 2018-06-13 MED ORDER — SODIUM CHLORIDE 0.9 % WEIGHT BASED INFUSION
3.0000 mL/kg/h | INTRAVENOUS | Status: DC
  Administered 2018-06-14: 3 mL/kg/h via INTRAVENOUS

## 2018-06-13 MED ORDER — TECHNETIUM TC 99M TETROFOSMIN IV KIT
10.3000 | PACK | Freq: Once | INTRAVENOUS | Status: AC | PRN
  Administered 2018-06-13: 10.3 via INTRAVENOUS
  Filled 2018-06-13: qty 11

## 2018-06-13 MED ORDER — TECHNETIUM TC 99M TETROFOSMIN IV KIT
31.7000 | PACK | Freq: Once | INTRAVENOUS | Status: AC | PRN
  Administered 2018-06-13: 31.7 via INTRAVENOUS
  Filled 2018-06-13: qty 32

## 2018-06-13 MED ORDER — ASPIRIN 81 MG PO CHEW
81.0000 mg | CHEWABLE_TABLET | ORAL | Status: AC
  Administered 2018-06-14: 81 mg via ORAL
  Filled 2018-06-13: qty 1

## 2018-06-13 MED ORDER — SODIUM CHLORIDE 0.9 % IV SOLN: 250.0000 mL | INTRAVENOUS | Status: DC | PRN

## 2018-06-13 MED ORDER — HEPARIN (PORCINE) IN NACL 100-0.45 UNIT/ML-% IJ SOLN
1150.0000 [IU]/h | INTRAMUSCULAR | Status: DC
  Administered 2018-06-13 (×2): 950 [IU]/h via INTRAVENOUS
  Filled 2018-06-13 (×3): qty 250

## 2018-06-13 MED ORDER — SODIUM CHLORIDE 0.9% FLUSH
3.0000 mL | INTRAVENOUS | Status: DC | PRN
  Administered 2018-06-13: 3 mL via INTRAVENOUS
  Filled 2018-06-13: qty 3

## 2018-06-13 MED ORDER — ONDANSETRON HCL 4 MG/2ML IJ SOLN: 4.0000 mg | Freq: Four times a day (QID) | INTRAMUSCULAR | Status: DC | PRN

## 2018-06-13 MED ORDER — HEPARIN BOLUS VIA INFUSION
1000.0000 [IU] | Freq: Once | INTRAVENOUS | Status: AC
  Administered 2018-06-14: 1000 [IU] via INTRAVENOUS
  Filled 2018-06-13: qty 1000

## 2018-06-13 MED ORDER — SODIUM CHLORIDE 0.9% FLUSH: 3.0000 mL | Freq: Two times a day (BID) | INTRAVENOUS | Status: DC

## 2018-06-13 MED ORDER — NITROGLYCERIN 0.4 MG SL SUBL: 0.4000 mg | SUBLINGUAL_TABLET | SUBLINGUAL | Status: DC | PRN

## 2018-06-13 MED ORDER — ROSUVASTATIN CALCIUM 40 MG PO TABS: 40.0000 mg | ORAL_TABLET | Freq: Every day | ORAL | Status: DC

## 2018-06-13 MED ORDER — SODIUM CHLORIDE 0.9 % WEIGHT BASED INFUSION: 1.0000 mL/kg/h | INTRAVENOUS | Status: DC

## 2018-06-13 MED ORDER — METOPROLOL SUCCINATE ER 25 MG PO TB24: 25.0000 mg | ORAL_TABLET | Freq: Every day | ORAL | Status: DC

## 2018-06-13 MED ORDER — LOSARTAN POTASSIUM 50 MG PO TABS: 100.0000 mg | ORAL_TABLET | Freq: Every day | ORAL | Status: DC

## 2018-06-13 MED ORDER — HEPARIN BOLUS VIA INFUSION
4000.0000 [IU] | Freq: Once | INTRAVENOUS | Status: AC
  Administered 2018-06-13: 4000 [IU] via INTRAVENOUS
  Filled 2018-06-13: qty 4000

## 2018-06-13 NOTE — Progress Notes (Signed)
ANTICOAGULATION CONSULT NOTE   Pharmacy Consult for heparin Indication: chest pain/ACS   Patient Measurements: Height: 5\' 8"  (172.7 cm) Weight: 153 lb 9.6 oz (69.7 kg) IBW/kg (Calculated) : 68.4 Heparin Dosing Weight: 69.9 kg   Vital Signs: Temp: 98 F (36.7 C) (07/15 2349) Temp Source: Oral (07/15 2349) BP: 119/56 (07/15 2349) Pulse Rate: 82 (07/15 2349)   Labs: Recent Labs    06/13/18 1407 06/13/18 2146 06/13/18 2300  HGB 13.2 14.0  --   HCT 41.6 44.2  --   PLT 197 200  --   LABPROT  --  13.8  --   INR  --  1.07  --   HEPARINUNFRC  --   --  0.21*  CREATININE 0.92  --   --      Assessment: 62 yo male with chest pain. Had stress test today which was deemed to be high risk. Planning to start heparin infusion, also planning cardiac cath tomorrow. Cbc wnl, SCr 0.9. Heparin level. 0.21 units/ml   Goal of Therapy:  Heparin level 0.3-0.7 units/ml Monitor platelets by anticoagulation protocol: Yes    Plan:  -Heparin 1000 units x1 and increase drip to 1100 units/hr -Daily HL, CBC   Angi Goodell [place] 06/13/2018,11:55 PM

## 2018-06-13 NOTE — Progress Notes (Signed)
ANTICOAGULATION CONSULT NOTE - Initial Consult  Pharmacy Consult for heparin Indication: chest pain/ACS   Patient Measurements:   Heparin Dosing Weight: 69.9 kg   Vital Signs: Temp: 97.7 F (36.5 C) (07/15 1350) Temp Source: Oral (07/15 1350) BP: 130/65 (07/15 1350) Pulse Rate: 81 (07/15 1350)   Labs: Recent Labs    06/13/18 1407  HGB 13.2  HCT 41.6  PLT 197  CREATININE 0.92     Assessment: 62 yo male with chest pain. Had stress test today which was deemed to be high risk. Planning to start heparin infusion, also planning cardiac cath tomorrow. Cbc wnl, SCr 0.9.   Goal of Therapy:  Heparin level 0.3-0.7 units/ml Monitor platelets by anticoagulation protocol: Yes    Plan:  -Heparin 4000 units x1 then 950 units/hr -Daily HL, CBC -Check level in 6 hours   [place], [place] 06/13/2018,3:22 PM

## 2018-06-13 NOTE — ED Notes (Addendum)
Pt is c/o very mild mid-chest pressure-ambulated to the BR about 10 minutes ago.  He denies SOB or dizziness.  Cards on call paged

## 2018-06-13 NOTE — ED Notes (Signed)
Pt ambulated to the BR with steady gait.  He denies cp or SOB

## 2018-06-13 NOTE — H&P (Signed)
Show:Clear all [x] Manual[x] Template[x] Copied  Added by: [x] Sueanne Margarita, MD   [] Hover for details    Cardiology Office Note:    Date:  06/13/2018   ID:  Donald Jennings, DOB 11-Jan-1956, MRN [id number]  PCP:  Leanna Battles, MD    Cardiologist:  No primary care provider on file.    Referring MD: Leanna Battles, MD      Chief Complaint  Patient presents with  . Coronary Artery Disease  . Hypertension  . Hyperlipidemia    History of Present Illness:    Donald Jennings is a 62 y.o. male with a hx of CAD, HTN, HLD and prior h/o TIA, who presentsto clinic todayfor nuclear stress test.[facility] given symptoms concerning for unstable angina. He underwentLHC with Dr. Burt Knack on 9/5/18and found to havetotal occlusion of the mLAD with left to left collaterals treated with PCI/DES x1, and 75% lesion in the distal RCA treated with PCI/DES x1. Normal LV function and LVEDP.He was placed onDAPT with ASA/plavix.Also discharged home on a statin, beta blocker and ARB.  He was seen by Lyda Jester on 05/26/2018 complaining of jaw and gum pain.  This occurred while he was in bed and was similar to what he has had with his angina in the past but only lasted for a few seconds.  Did not complain of any chest pain at that time.  He did not let the nuclear tech and exercise physiologist know that he had had chest pain this past week and proceeded with nuclear stress test.  Nuclear stress test showed a small fixed defect in the apex with a larger peri-infarct ischemia.  After reviewing the nuclear stress test and talking with the patient he tells me that last week he had chest pain for 4 days but did not seek medical attention.  During the stress test he had at least 1.5 mm of horizontal ST segment depression and now with a new defect in the apex and anterior apical region.  This is new compared to a nuclear stress test done a year ago.  He currently  is pain-free.       Past Medical History:  Diagnosis Date  . Abnormal thallium stress test 05/20/2016  . Amaurosis fugax 06/03/2016  . CAD (coronary artery disease), native coronary artery 07/27/2017   < 40% prox LAD, 50% mid LAD and 40% distal RCA  . Carotid artery stenosis 07/27/2017   1-39% bilateral  . CERVICAL RADICULOPATHY 07/02/2010   Qualifier: Diagnosis of  By: Ronnald Ramp MD, Arvid Right.   . DEPRESSION 03/21/2009   Qualifier: Diagnosis of  By: Ronnald Ramp MD, Arvid Right.   . Diabetes mellitus without complication ([place])   . Dysphagia 05/18/2016  . Eczema, dyshidrotic 05/27/2015  . ELECTROCARDIOGRAM, ABNORMAL 03/21/2009  . Essential hypertension 06/19/2009       . GERD 06/19/2009   Qualifier: Diagnosis of  By: Ronnald Ramp MD, Arvid Right.   [name] HLD (hyperlipidemia)   . Hx of cardiovascular stress test    ETT-Myoview (03/2014):  No ischemia, EF 56%; Low Risk  . Hyperlipidemia LDL goal <70 06/03/2016  . Lytic bone lesions on xray 02/22/2017  . Neuropathy of both feet 05/28/2015  . Routine general medical examination at a health care facility 04/04/2013  . SHOULDER INJURY, RIGHT 07/02/2010   Qualifier: Diagnosis of  By: Ronnald Ramp MD, Arvid Right.   . SOB (shortness of breath) 06/10/2016  . Solitary plasmacytoma of bone ([place]) 03/05/2017  .  TOBACCO USE 03/21/2009   Qualifier: Diagnosis of  By: Ronnald Ramp MD, Arvid Right.   . Type 2 diabetes mellitus with manifestations ([place]) 03/27/2015  . Vertebral artery stenosis, asymptomatic 06/22/2016  . Vitamin D deficiency 04/04/2013         Past Surgical History:  Procedure Laterality Date  . CARDIAC CATHETERIZATION    . CATARACT EXTRACTION Bilateral    Left [facility] at [facility], Right 2004 in [place]  . COLONOSCOPY  07/25/2015   Dr Carol Ada  . CORONARY STENT INTERVENTION N/A 08/04/2017   Procedure: CORONARY STENT INTERVENTION;  Surgeon: Sherren Mocha, MD;  Location: [place] [facility];  Service: Cardiovascular;  Laterality: N/A;  . INGUINAL HERNIA REPAIR Right 1986    . IR FLUORO GUIDED NEEDLE PLC ASPIRATION/INJECTION LOC  03/02/2017  . IR FLUORO GUIDED NEEDLE PLC ASPIRATION/INJECTION LOC  03/02/2017  . IR FLUORO GUIDED NEEDLE PLC ASPIRATION/INJECTION LOC  03/02/2017  . IR RADIOLOGIST EVAL & MGMT  03/01/2017  . LEFT HEART CATH AND CORONARY ANGIOGRAPHY N/A 08/04/2017   Procedure: LEFT HEART CATH AND CORONARY ANGIOGRAPHY;  Surgeon: Sherren Mocha, MD;  Location: [place] [facility];  Service: Cardiovascular;  Laterality: N/A;    Current Medications: ActiveMedications  No outpatient medications have been marked as taking for the 06/13/18 encounter (Office Visit) with Sueanne Margarita, MD.       Allergies:   Patient has no known allergies.   Social History   Socioeconomic History  . Marital status: Married    Spouse name: Not on file  . Number of children: 1  . Years of education: Not on file  . Highest education level: Not on file  Occupational History  . Occupation: Scientist, clinical (histocompatibility and immunogenetics)  Social Needs  . Financial resource strain: Not on file  . Food insecurity:    Worry: Not on file    Inability: Not on file  . Transportation needs:    Medical: Not on file    Non-medical: Not on file  Tobacco Use  . Smoking status: Current Every Day Smoker    Packs/day: 1.00    Years: 38.50    Pack years: 38.50    Types: Cigarettes  . Smokeless tobacco: Never Used  . Tobacco comment: Currently  smokes 1/2 ppd and is considering Chantix  Substance and Sexual Activity  . Alcohol use: No  . Drug use: No  . Sexual activity: Not on file  Lifestyle  . Physical activity:    Days per week: Not on file    Minutes per session: Not on file  . Stress: Not on file  Relationships  . Social connections:    Talks on phone: Not on file    Gets together: Not on file    Attends religious service: Not on file    Active member of club or organization: Not on file    Attends meetings of clubs or organizations: Not on file     Relationship status: Not on file  Other Topics Concern  . 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 There is no history of Cancer, Early death, Hypertension, Hyperlipidemia, or Kidney disease.  ROS:   Please see the history of present illness.  ROS  All other systems reviewed and negative.   EKGs/Labs/Other Studies Reviewed:    The following studies were reviewed today: Nuclear stress test  EKG:  EKG is  ordered today.  The ekg ordered today demonstrates 1.42mm of horizontal ST segment depression in the inferior lateral leads during peak exercise.  Recent Labs: 08/05/2017: BUN 15; Creatinine, Ser 0.76; Hemoglobin 12.7; Platelets 220; Potassium 3.8; Sodium 139 09/13/2017: ALT 14   Recent Lipid Panel Labs(Brief)          Component Value Date/Time   CHOL 96 (L) 09/13/2017 0930   TRIG 91 09/13/2017 0930   HDL 33 (L) 09/13/2017 0930   CHOLHDL 2.9 09/13/2017 0930   CHOLHDL 2.9 08/14/2016 1155   VLDL 25 08/14/2016 1155   LDLCALC 45 09/13/2017 0930   LDLDIRECT 167.4 03/21/2009 1030      Physical Exam:    VS:  There were no vitals taken for this visit.       Wt Readings from Last 3 Encounters:  06/13/18 154 lb (69.9 kg)  05/26/18 154 lb (69.9 kg)  10/11/17 157 lb 10.1 oz (71.5 kg)     GEN:  Well nourished, well developed in no acute distress HEENT: Normal NECK: No JVD; No carotid bruits LYMPHATICS: No lymphadenopathy CARDIAC: RRR, no murmurs, rubs, gallops RESPIRATORY:  Clear to auscultation without rales, wheezing or rhonchi  ABDOMEN: Soft, non-tender, non-distended MUSCULOSKELETAL:  No edema; No deformity  SKIN: Warm and dry NEUROLOGIC:  Alert and oriented x 3 PSYCHIATRIC:   Normal affect   ASSESSMENT:    1. Coronary artery disease with exertional angina ([place])   2. Essential hypertension   3. Hyperlipidemia LDL goal <70   4. Abnormal nuclear stress test    PLAN:    In order of problems listed above:  1.  Unstable angina -he had an episode of jaw pain several weeks ago that prompted nuclear stress test.  This was similar pain to what he had prior to his angioplasty last fall.  He then had 4 days of chest discomfort last week but did not seek medical attention and did not let the nuclear tach or exercise physiologist no prior to his nuclear stress test today.  During the stress test he developed 1.5 mm of horizontal ST segment depression in the inferior lateral leads and nuclear images showed a fixed apical defect with peri-infarct ischemia and ischemia in the anterior apical segment.  Considered a high risk study.  He is currently pain-free.   -Admit to telemetry bed -Cycle troponin given his prolonged chest pain last week and new fixed defect in the apex on nuclear stress test that was not there on prior nuclear stress test. -Start IV heparin per pharmacy protocol -Continue aspirin 81 mg daily, Plavix 75 mg daily, statin, beta-blocker. -Cmet, CBC, INR, PT. -N.p.o. after midnight -Plan for cardiac catheterization in the a.m. -Cardiac catheterization was discussed with the patient fully. The patient understands that risks include but are not limited to stroke (1 in 1000), death (1 in 26), kidney failure [usually temporary] (1 in 500), bleeding (1 in 200), allergic reaction [possibly serious] (1 in 200).  The patient understands and is willing to proceed.    2.  ASCAD - cath 07/2017 showedtotal occlusion of the mLAD with left to left collaterals treated with PCI/DES x1, and 75% lesion in the distal RCA treated with PCI/DES x1 -Continue aspirin, Plavix, beta-blocker, statin  3.  Hypertension - BP is controlled on exam today. -Continue Toprol-XL 25 mg  daily and losartan 100 mg daily.  4.  Hyperlipidemia with LDL goal less than 70 -Continue Crestor 40 mg daily. -Check FLP and ALT in a.m.     Medication Adjustments/Labs and Tests Ordered: Current medicines are reviewed at length with the patient today.  Concerns regarding medicines are outlined above.  No orders of the defined types were placed in this encounter.  No orders of the defined types were placed in this encounter.   Signed, Fransico Him, MD  06/13/2018 12:22 PM    [facility]

## 2018-06-13 NOTE — ED Provider Notes (Signed)
[facility] EMERGENCY DEPARTMENT Provider Note   CSN: [id number] Arrival date & time: 06/13/18  1343     History   Chief Complaint Chief Complaint  Patient presents with  . Chest Pain    HPI Donald Jennings is a 62 y.o. male with a history of CAD, HTN, cervical radiculopathy, diabetes mellitus, and GERD who presents to the emergency department from his cardiologist office with a chief complaint of abnormal test.  The patient had a nuclear stress test performed that showed a small fixed defect in the apex with a larger peri-infarct ischemia.  Stress test also showed no ST segment depression. the patient reports that he has had chest pain for 4 days but did not seek medical attention.  He denies chest pain at this time.  No dyspnea, fever, chills, nausea, vomiting, diarrhea, dizziness, lightheadedness, headache.  No treatment prior to arrival.  The patient was initially examined by me during the quick look process in triage.  The history is provided by the patient. No language interpreter was used.    Past Medical History:  Diagnosis Date  . Abnormal thallium stress test 05/20/2016  . Amaurosis fugax 06/03/2016  . CAD (coronary artery disease), native coronary artery 07/27/2017   < 40% prox LAD, 50% mid LAD and 40% distal RCA  . Carotid artery stenosis 07/27/2017   1-39% bilateral  . CERVICAL RADICULOPATHY 07/02/2010   Qualifier: Diagnosis of  By: Ronnald Ramp MD, Arvid Right.   . DEPRESSION 03/21/2009   Qualifier: Diagnosis of  By: Ronnald Ramp MD, Arvid Right.   . Diabetes mellitus without complication ([place])   . Dysphagia 05/18/2016  . Eczema, dyshidrotic 05/27/2015  . ELECTROCARDIOGRAM, ABNORMAL 03/21/2009  . Essential hypertension 06/19/2009       . GERD 06/19/2009   Qualifier: Diagnosis of  By: Ronnald Ramp MD, Arvid Right.   [name] HLD (hyperlipidemia)   . Hx of cardiovascular stress test    ETT-Myoview (03/2014):  No ischemia, EF 56%; Low Risk  . Hyperlipidemia LDL goal <70 06/03/2016  . Lytic bone  lesions on xray 02/22/2017  . Neuropathy of both feet 05/28/2015  . Routine general medical examination at a health care facility 04/04/2013  . SHOULDER INJURY, RIGHT 07/02/2010   Qualifier: Diagnosis of  By: Ronnald Ramp MD, Arvid Right.   . SOB (shortness of breath) 06/10/2016  . Solitary plasmacytoma of bone ([place]) 03/05/2017  . TOBACCO USE 03/21/2009   Qualifier: Diagnosis of  By: Ronnald Ramp MD, Arvid Right.   . Type 2 diabetes mellitus with manifestations ([place]) 03/27/2015  . Vertebral artery stenosis, asymptomatic 06/22/2016  . Vitamin D deficiency 04/04/2013    Patient Active Problem List   Diagnosis Date Noted  . Abnormal nuclear stress test 06/13/2018  . Unstable angina ([place])   . Coronary artery disease due to lipid rich plaque   . Pure hypercholesterolemia   . Coronary artery disease with exertional angina ([place]) 08/04/2017  . Carotid artery stenosis 07/27/2017  . CAD (coronary artery disease), native coronary artery 07/27/2017  . Solitary plasmacytoma of bone ([place]) 03/05/2017  . Lytic bone lesions on xray 02/22/2017  . Vertebral artery stenosis, asymptomatic 06/22/2016  . Amaurosis fugax 06/03/2016  . Hyperlipidemia LDL goal <70 06/03/2016  . Abnormal thallium stress test 05/20/2016  . Dysphagia 05/18/2016  . Neuropathy of both feet 05/28/2015  . Eczema, dyshidrotic 05/27/2015  . Type 2 diabetes mellitus with manifestations ([place]) 03/27/2015  . Routine general medical examination at a health care facility 04/04/2013  . Vitamin D  deficiency 04/04/2013  . Essential hypertension 06/19/2009  . GERD 06/19/2009  . TOBACCO USE 03/21/2009  . ELECTROCARDIOGRAM, ABNORMAL 03/21/2009    Past Surgical History:  Procedure Laterality Date  . CARDIAC CATHETERIZATION    . CATARACT EXTRACTION Bilateral    Left 2009 at [facility], Right 2004 in [place]  . COLONOSCOPY  07/25/2015   Dr Carol Ada  . CORONARY STENT INTERVENTION N/A 08/04/2017   Procedure: CORONARY STENT INTERVENTION;  Surgeon: Sherren Mocha, MD;   Location: [facility];  Service: Cardiovascular;  Laterality: N/A;  . INGUINAL HERNIA REPAIR Right 1986  . IR FLUORO GUIDED NEEDLE PLC ASPIRATION/INJECTION LOC  03/02/2017  . IR FLUORO GUIDED NEEDLE PLC ASPIRATION/INJECTION LOC  03/02/2017  . IR FLUORO GUIDED NEEDLE PLC ASPIRATION/INJECTION LOC  03/02/2017  . IR RADIOLOGIST EVAL & MGMT  03/01/2017  . LEFT HEART CATH AND CORONARY ANGIOGRAPHY N/A 08/04/2017   Procedure: LEFT HEART CATH AND CORONARY ANGIOGRAPHY;  Surgeon: Sherren Mocha, MD;  Location: [facility];  Service: Cardiovascular;  Laterality: N/A;        Home Medications    Prior to Admission medications   Medication Sig Start Date End Date Taking? Authorizing Provider  aspirin EC 81 MG tablet Take 1 tablet (81 mg total) by mouth daily. 06/03/16   Janith Lima, MD  clopidogrel (PLAVIX) 75 MG tablet TAKE 1 TABLET (75 MG TOTAL) BY MOUTH DAILY WITH BREAKFAST. 04/22/18   Reino Bellis B, NP  diclofenac sodium (VOLTAREN) 1 % GEL Apply 1 application topically as needed for pain.    [provider]  lidocaine (LIDODERM) 5 % Place 1 patch onto the skin daily. Remove & Discard patch within 12 hours or as directed by MD    [provider]  losartan (COZAAR) 100 MG tablet Take 1 tablet (100 mg total) by mouth daily. 05/26/18   Lyda Jester M, PA-C  metoprolol succinate (TOPROL-XL) 25 MG 24 hr tablet Take 1 tablet (25 mg total) by mouth daily. 05/26/18   Lyda Jester M, PA-C  Multiple Vitamins-Minerals (MULTIVITAMIN ADULT) TABS Take 1 tablet by mouth daily.    [provider]  nitroGLYCERIN (NITROSTAT) 0.4 MG SL tablet Place 1 tablet (0.4 mg total) under the tongue every 5 (five) minutes as needed. 08/05/17   Cheryln Manly, NP  pantoprazole (PROTONIX) 40 MG tablet TAKE 1 TABLET BY MOUTH EVERY DAY 09/30/17   Sueanne Margarita, MD  rosuvastatin (CRESTOR) 40 MG tablet Take 1 tablet (40 mg total) by mouth daily. 07/27/17   Sueanne Margarita, MD    varenicline (CHANTIX CONTINUING MONTH PAK) 1 MG tablet Take 1 tablet (1 mg total) by mouth 2 (two) times daily. 09/08/17   Sueanne Margarita, MD  Vitamin D, Ergocalciferol, (DRISDOL) 50000 units CAPS capsule TAKE 1 CAPSULE (50,000 UNITS TOTAL) BY MOUTH ONCE A WEEK. 04/05/17   Janith Lima, MD    Family History Family History  Problem Relation Age of Onset  . Diabetes Mother   . Arthritis Mother   . Heart disease Father   . Stroke Father   . Stroke Brother   . Cancer Neg Hx   . Early death Neg Hx   . Hypertension Neg Hx   . Hyperlipidemia Neg Hx   . Kidney disease Neg Hx     Social History Social History   Tobacco Use  . Smoking status: Current Every Day Smoker    Packs/day: 1.00    Years: 38.50  Pack years: 38.50    Types: Cigarettes  . Smokeless tobacco: Never Used  . Tobacco comment: Currently  smokes 1/2 ppd and is considering Chantix  Substance Use Topics  . Alcohol use: No  . Drug use: No     Allergies   Patient has no known allergies.   Review of Systems Review of Systems  Constitutional: Negative for appetite change, chills and fever.  Respiratory: Negative for shortness of breath.   Cardiovascular: Positive for chest pain.  Gastrointestinal: Negative for abdominal pain.  Genitourinary: Negative for dysuria.  Musculoskeletal: Negative for back pain.  Skin: Negative for rash.  Allergic/Immunologic: Negative for immunocompromised state.  Neurological: Negative for headaches.  Psychiatric/Behavioral: Negative for confusion.     Physical Exam Updated Vital Signs BP 130/65 (BP Location: Right Arm)   Pulse 81   Temp 97.7 F (36.5 C) (Oral)   Resp 18   SpO2 97%   Physical Exam  Constitutional: He appears well-developed and well-nourished. No distress.  HENT:  Head: Normocephalic.  Eyes: Conjunctivae are normal.  Neck: Neck supple.  Cardiovascular: Normal rate, regular rhythm, normal heart sounds and intact distal pulses. Exam reveals no gallop  and no friction rub.  No murmur heard. Pulmonary/Chest: Effort normal and breath sounds normal. No stridor. No respiratory distress. He has no wheezes. He has no rales. He exhibits no tenderness.  Lungs are clear to auscultation bilaterally.  Abdominal: Soft. He exhibits no distension. There is no tenderness.  Neurological: He is alert.  Ambulatory without difficulty.  Skin: Skin is warm and dry. He is not diaphoretic.  Psychiatric: His behavior is normal.  Nursing note and vitals reviewed.  ED Treatments / Results  Labs (all labs ordered are listed, but only abnormal results are displayed) Labs Reviewed  BASIC METABOLIC PANEL - Abnormal; Notable for the following components:      Result Value   Glucose, Bld 125 (*)    All other components within normal limits  CBC  I-STAT TROPONIN, ED    EKG None  Radiology Dg Chest 2 View  Result Date: 06/13/2018 CLINICAL DATA:  Chest pain, LEFT jaw tingling. History of cardiac stent. Smoker. EXAM: CHEST - 2 VIEW COMPARISON:  Chest radiograph February 17, 2017 FINDINGS: Cardiomediastinal silhouette is normal. Coronary artery stent. Mild bronchitic changes. No pleural effusions or focal consolidations. Trachea projects midline and there is no pneumothorax. Soft tissue planes and included osseous structures are non-suspicious. Mild degenerative change of the thoracic spine. IMPRESSION: Mild bronchitic changes without focal consolidation. Electronically Signed   By: Elon Alas M.D.   On: 06/13/2018 14:23    Procedures Procedures (including critical care time)  Medications Ordered in ED Medications  heparin bolus via infusion 4,000 Units (has no administration in time range)  heparin ADULT infusion 100 units/mL (25000 units/248mL sodium chloride 0.45%) (has no administration in time range)     Initial Impression / Assessment and Plan / ED Course  I have reviewed the triage vital signs and the nursing notes.  Pertinent labs & imaging  results that were available during my care of the patient were reviewed by me and considered in my medical decision making (see chart for details).    62 year old male with a history of CAD, HTN, cervical radiculopathy, diabetes mellitus, and GERD who presents to the emergency department from his cardiology's office after having an abnormal stress test.  He reports chest pain over the last 4 days, but is currently pain-free.  Initial troponin is negative.  BMP  and CBC are reassuring.  Chest x-ray today with mild bronchitic changes but no focal consolidation.  Unfractionated heparin level is pending.  Heparin infusion initiated in the ED  Per pharmacy.  Dr. Golden Hurter, cardiology, will admit for unstable angina.  Final Clinical Impressions(s) / ED Diagnoses   Final diagnoses:  Unstable angina [facility])    ED Discharge Orders    None       Joanne Gavel, PA-C 06/13/18 1612    Drenda Freeze, MD 06/14/18 [id number]

## 2018-06-13 NOTE — ED Notes (Signed)
Dinner tray ordered.

## 2018-06-13 NOTE — ED Notes (Signed)
Pt reports he had a stress test done this am and was instructed to come to the ED d/t having a weird feeling in his gums while doing the stress test.  Pt reports cp last week which lasted for 3 days.  Denies any alleviating or aggravating factors.  He went to see his cards MD this am and that when he was sent for a stress test.  He is A&Ox 4.  Family is at bedside.  Denies any cp or any complaints at this time.  He reports having a cardiac cath Aug last year and had 2 stents placed.

## 2018-06-13 NOTE — ED Notes (Signed)
Pt reports chest pressure have resolved

## 2018-06-13 NOTE — Progress Notes (Signed)
Cardiology Office Note:    Date:  06/13/2018   ID:  Donald Jennings, DOB 1956/03/28, MRN [id number]  PCP:  Donald Battles, MD  Cardiologist:  No primary care provider on file.    Referring MD: Donald Battles, MD   Chief Complaint  Patient presents with  . Coronary Artery Disease  . Hypertension  . Hyperlipidemia    History of Present Illness:    Donald Jennings is a 62 y.o. male with a hx of CAD, HTN, HLD and prior h/o TIA, who presentsto clinic today for nuclear stress test. He was admitted to [facility] Sep[facility] for [facility] given symptoms concerning for unstable angina. He underwentLHC with Dr. Burt Jennings on 9/5/18and found to havetotal occlusion of the mLAD with left to left collaterals treated with PCI/DES x1, and 75% lesion in the distal RCA treated with PCI/DES x1. Normal LV function and LVEDP. He was placed on  DAPT with ASA/plavix.  Also discharged home on a statin, beta blocker and ARB.  He was seen by Donald Jennings on 05/26/2018 complaining of jaw and gum pain.  This occurred while he was in bed and was similar to what he has had with his angina in the past but only lasted for a few seconds.  Did not complain of any chest pain at that time.  He did not let the nuclear tech and exercise physiologist know that he had had chest pain this past week and proceeded with nuclear stress test.  Nuclear stress test showed a small fixed defect in the apex with a larger peri-infarct ischemia.  After reviewing the nuclear stress test and talking with the patient he tells me that last week he had chest pain for 4 days but did not seek medical attention.  During the stress test he had at least 1.5 mm of horizontal ST segment depression and now with a new defect in the apex and anterior apical region.  This is new compared to a nuclear stress test done a year ago.  He currently is pain-free.   Past Medical History:  Diagnosis Date  . Abnormal thallium stress test 05/20/2016  . Amaurosis fugax 06/03/2016  .  CAD (coronary artery disease), native coronary artery 07/27/2017   < 40% prox LAD, 50% mid LAD and 40% distal RCA  . Carotid artery stenosis 07/27/2017   1-39% bilateral  . CERVICAL RADICULOPATHY 07/02/2010   Qualifier: Diagnosis of  By: Donald Ramp MD, Donald Jennings.   . DEPRESSION 03/21/2009   Qualifier: Diagnosis of  By: Donald Ramp MD, Donald Jennings.   . Diabetes mellitus without complication ([place])   . Dysphagia 05/18/2016  . Eczema, dyshidrotic 05/27/2015  . ELECTROCARDIOGRAM, ABNORMAL 03/21/2009  . Essential hypertension 06/19/2009       . GERD 06/19/2009   Qualifier: Diagnosis of  By: Donald Ramp MD, Donald Jennings.   [name] HLD (hyperlipidemia)   . Hx of cardiovascular stress test    ETT-Myoview (03/2014):  No ischemia, EF 56%; Low Risk  . Hyperlipidemia LDL goal <70 06/03/2016  . Lytic bone lesions on xray 02/22/2017  . Neuropathy of both feet 05/28/2015  . Routine general medical examination at a health care facility 04/04/2013  . SHOULDER INJURY, Jennings 07/02/2010   Qualifier: Diagnosis of  By: Donald Ramp MD, Donald Jennings.   . SOB (shortness of breath) 06/10/2016  . Solitary plasmacytoma of bone ([place]) 03/05/2017  . TOBACCO USE 03/21/2009   Qualifier: Diagnosis of  By: Donald Ramp MD, Donald Jennings.   . Type 2 diabetes mellitus  with manifestations ([place]) 03/27/2015  . Vertebral artery stenosis, asymptomatic 06/22/2016  . Vitamin D deficiency 04/04/2013    Past Surgical History:  Procedure Laterality Date  . CARDIAC CATHETERIZATION    . CATARACT EXTRACTION Bilateral    Left 2009 at [facility], [place] 2004 in [place]  . COLONOSCOPY  07/25/2015   Dr Donald Jennings  . CORONARY STENT INTERVENTION N/A 08/04/2017   Procedure: CORONARY STENT INTERVENTION;  Surgeon: Donald Mocha, MD;  Location: [facility];  Service: Cardiovascular;  Laterality: N/A;  . INGUINAL HERNIA REPAIR Jennings 1986  . IR FLUORO GUIDED NEEDLE PLC ASPIRATION/INJECTION LOC  03/02/2017  . IR FLUORO GUIDED NEEDLE PLC ASPIRATION/INJECTION LOC  03/02/2017  . IR FLUORO GUIDED NEEDLE PLC  ASPIRATION/INJECTION LOC  03/02/2017  . IR RADIOLOGIST EVAL & MGMT  03/01/2017  . LEFT HEART CATH AND CORONARY ANGIOGRAPHY N/A 08/04/2017   Procedure: LEFT HEART CATH AND CORONARY ANGIOGRAPHY;  Surgeon: Donald Mocha, MD;  Location: [facility];  Service: Cardiovascular;  Laterality: N/A;    Current Medications: No outpatient medications have been marked as taking for the 06/13/18 encounter (Office Visit) with Donald Margarita, MD.     Allergies:   Patient has no known allergies.   Social History   Socioeconomic History  . Marital status: Married    Spouse name: Not on file  . Number of children: 1  . Years of education: Not on file  . Highest education level: Not on file  Occupational History  . Occupation: Scientist, clinical (histocompatibility and immunogenetics)  Social Needs  . Financial resource strain: Not on file  . Food insecurity:    Worry: Not on file    Inability: Not on file  . Transportation needs:    Medical: Not on file    Non-medical: Not on file  Tobacco Use  . Smoking status: Current Every Day Smoker    Packs/day: 1.00    Years: 38.50    Pack years: 38.50    Types: Cigarettes  . Smokeless tobacco: Never Used  . Tobacco comment: Currently  smokes 1/2 ppd and is considering Chantix  Substance and Sexual Activity  . Alcohol use: No  . Drug use: No  . Sexual activity: Not on file  Lifestyle  . Physical activity:    Days per week: Not on file    Minutes per session: Not on file  . Stress: Not on file  Relationships  . Social connections:    Talks on phone: Not on file    Gets together: Not on file    Attends religious service: Not on file    Active member of club or organization: Not on file    Attends meetings of clubs or organizations: Not on file    Relationship status: Not on file  Other Topics Concern  . Not on file  Social History Narrative   Married 1 sone born 1998   Waiter [employer]   2 caffeine drinks daily   Updated 07/29/2016     Family History: [name]  family history includes Arthritis in his mother; Diabetes in his mother; Heart disease in his father; Stroke in his brother and father. There is no history of Cancer, Early death, Hypertension, Hyperlipidemia, or Kidney disease.  ROS:   Please see the history of present illness.    ROS  All other systems reviewed and negative.   EKGs/Labs/Other Studies Reviewed:    The following studies were reviewed today: Nuclear stress test  EKG:  EKG is  ordered today.  The ekg ordered today demonstrates 1.5mm of horizontal ST segment depression in the inferior lateral leads during peak exercise.  Recent Labs: 08/05/2017: BUN 15; Creatinine, Ser 0.76; Hemoglobin 12.7; Platelets 220; Potassium 3.8; Sodium 139 09/13/2017: ALT 14   Recent Lipid Panel    Component Value Date/Time   CHOL 96 (L) 09/13/2017 0930   TRIG 91 09/13/2017 0930   HDL 33 (L) 09/13/2017 0930   CHOLHDL 2.9 09/13/2017 0930   CHOLHDL 2.9 08/14/2016 1155   VLDL 25 08/14/2016 1155   LDLCALC 45 09/13/2017 0930   LDLDIRECT 167.4 03/21/2009 1030    Physical Exam:    VS:  There were no vitals taken for this visit.    Wt Readings from Last 3 Encounters:  06/13/18 154 lb (69.9 kg)  05/26/18 154 lb (69.9 kg)  10/11/17 157 lb 10.1 oz (71.5 kg)     GEN:  Well nourished, well developed in no acute distress HEENT: Normal NECK: No JVD; No carotid bruits LYMPHATICS: No lymphadenopathy CARDIAC: RRR, no murmurs, rubs, gallops RESPIRATORY:  Clear to auscultation without rales, wheezing or rhonchi  ABDOMEN: Soft, non-tender, non-distended MUSCULOSKELETAL:  No edema; No deformity  SKIN: Warm and dry NEUROLOGIC:  Alert and oriented x 3 PSYCHIATRIC:  Normal affect   ASSESSMENT:    1. Coronary artery disease with exertional angina ([place])   2. Essential hypertension   3. Hyperlipidemia LDL goal <70   4. Abnormal nuclear stress test    PLAN:    In order of problems listed above:  1.  Unstable angina -he had an episode of jaw  pain several weeks ago that prompted nuclear stress test.  This was similar pain to what he had prior to his angioplasty last fall.  He then had 4 days of chest discomfort last week but did not seek medical attention and did not let the nuclear tach or exercise physiologist no prior to his nuclear stress test today.  During the stress test he developed 1.5 mm of horizontal ST segment depression in the inferior lateral leads and nuclear images showed a fixed apical defect with peri-infarct ischemia and ischemia in the anterior apical segment.  Considered a high risk study.  He is currently pain-free.   -Admit to telemetry bed -Cycle troponin given his prolonged chest pain last week and new fixed defect in the apex on nuclear stress test that was not there on prior nuclear stress test. -Start IV heparin per pharmacy protocol -Continue aspirin 81 mg daily, Plavix 75 mg daily, statin, beta-blocker. -Cmet, CBC, INR, PT. -N.p.o. after midnight -Plan for cardiac catheterization in the a.m. -Cardiac catheterization was discussed with the patient fully. The patient understands that risks include but are not limited to stroke (1 in 1000), death (1 in 11), kidney failure [usually temporary] (1 in 500), bleeding (1 in 200), allergic reaction [possibly serious] (1 in 200).  The patient understands and is willing to proceed.    2.  ASCAD - cath 07/2017 showedtotal occlusion of the mLAD with left to left collaterals treated with PCI/DES x1, and 75% lesion in the distal RCA treated with PCI/DES x1 -Continue aspirin, Plavix, beta-blocker, statin  3.  Hypertension - BP is controlled on exam today. -Continue Toprol-XL 25 mg daily and losartan 100 mg daily.  4.  Hyperlipidemia with LDL goal less than 70 -Continue Crestor 40 mg daily. -Check FLP and ALT in a.m.     Medication Adjustments/Labs and Tests Ordered: Current medicines are reviewed at length with  the patient today.  Concerns regarding medicines are  outlined above.  No orders of the defined types were placed in this encounter.  No orders of the defined types were placed in this encounter.   Signed, Donald Him, MD  06/13/2018 12:22 PM    [facility]

## 2018-06-13 NOTE — ED Triage Notes (Signed)
Patient complains of chest pain that started a few days ago. Went cardiologist office, states he has an appointment tomorrow for cardiac catheterization, had stress test this morning and was told at stress test he need to go to emergency department instead of waiting until tomorrow. Patient denies chest pain at this time. Patient alert, oriented, and in no apparent distress at this time.

## 2018-06-14 ENCOUNTER — Ambulatory Visit (HOSPITAL_COMMUNITY)
Admission: RE | Admit: 2018-06-14 | Payer: Federal, State, Local not specified - PPO | Source: Ambulatory Visit | Admitting: Interventional Cardiology

## 2018-06-14 ENCOUNTER — Encounter (HOSPITAL_COMMUNITY)
Admission: EM | Disposition: A | Discharge status: Home / Self Care | Payer: Self-pay | Source: Home / Self Care | Attending: Cardiology

## 2018-06-14 ENCOUNTER — Encounter (HOSPITAL_COMMUNITY): Payer: Self-pay | Admitting: Cardiovascular Disease

## 2018-06-14 DIAGNOSIS — I25110 Atherosclerotic heart disease of native coronary artery with unstable angina pectoris: Principal | ICD-10-CM

## 2018-06-14 DIAGNOSIS — E1140 Type 2 diabetes mellitus with diabetic neuropathy, unspecified: Secondary | ICD-10-CM | POA: Diagnosis not present

## 2018-06-14 DIAGNOSIS — E785 Hyperlipidemia, unspecified: Secondary | ICD-10-CM | POA: Diagnosis not present

## 2018-06-14 DIAGNOSIS — F17210 Nicotine dependence, cigarettes, uncomplicated: Secondary | ICD-10-CM | POA: Diagnosis not present

## 2018-06-14 HISTORY — PX: LEFT HEART CATH AND CORONARY ANGIOGRAPHY: CATH118249

## 2018-06-14 LAB — HIV ANTIBODY (ROUTINE TESTING W REFLEX): HIV Screen 4th Generation wRfx: NONREACTIVE

## 2018-06-14 LAB — CBC
HCT: 40.1 % (ref 39.0–52.0)
HEMOGLOBIN: 12.7 g/dL — AB (ref 13.0–17.0)
MCH: 27.5 pg (ref 26.0–34.0)
MCHC: 31.7 g/dL (ref 30.0–36.0)
MCV: 87 fL (ref 78.0–100.0)
Platelets: 180 10*3/uL (ref 150–400)
RBC: 4.61 MIL/uL (ref 4.22–5.81)
RDW: 13.2 % (ref 11.5–15.5)
WBC: 7.9 10*3/uL (ref 4.0–10.5)

## 2018-06-14 LAB — COMPREHENSIVE METABOLIC PANEL
ALT: 17 U/L (ref 0–44)
AST: 20 U/L (ref 15–41)
Albumin: 4 g/dL (ref 3.5–5.0)
Alkaline Phosphatase: 52 U/L (ref 38–126)
Anion gap: 10 (ref 5–15)
BUN: 17 mg/dL (ref 8–23)
CHLORIDE: 105 mmol/L (ref 98–111)
CO2: 28 mmol/L (ref 22–32)
Calcium: 9.4 mg/dL (ref 8.9–10.3)
Creatinine, Ser: 1.01 mg/dL (ref 0.61–1.24)
Glucose, Bld: 83 mg/dL (ref 70–99)
POTASSIUM: 3.3 mmol/L — AB (ref 3.5–5.1)
SODIUM: 143 mmol/L (ref 135–145)
Total Bilirubin: 0.5 mg/dL (ref 0.3–1.2)
Total Protein: 6.8 g/dL (ref 6.5–8.1)

## 2018-06-14 LAB — BASIC METABOLIC PANEL
ANION GAP: 9 (ref 5–15)
BUN: 19 mg/dL (ref 8–23)
CALCIUM: 9.4 mg/dL (ref 8.9–10.3)
CO2: 24 mmol/L (ref 22–32)
Chloride: 109 mmol/L (ref 98–111)
Creatinine, Ser: 0.93 mg/dL (ref 0.61–1.24)
GFR calc non Af Amer: >60 mL/min (ref >60)
Glucose, Bld: 101 mg/dL — ABNORMAL HIGH (ref 70–99)
POTASSIUM: 4 mmol/L (ref 3.5–5.1)
Sodium: 142 mmol/L (ref 135–145)

## 2018-06-14 LAB — HEPARIN LEVEL (UNFRACTIONATED): HEPARIN UNFRACTIONATED: 0.34 [IU]/mL (ref 0.30–0.70)

## 2018-06-14 LAB — LIPID PANEL
CHOL/HDL RATIO: 3 ratio
Cholesterol: 100 mg/dL (ref 0–200)
HDL: 33 mg/dL — ABNORMAL LOW (ref >40)
LDL Cholesterol: 46 mg/dL (ref 0–99)
Triglycerides: 104 mg/dL (ref <150)
VLDL: 21 mg/dL (ref 0–40)

## 2018-06-14 LAB — TROPONIN I
TROPONIN I: 0.03 ng/mL — AB (ref <0.03)
TROPONIN I: 0.03 ng/mL — AB (ref <0.03)
TROPONIN I: 0.03 ng/mL — AB (ref <0.03)

## 2018-06-14 LAB — MAGNESIUM: MAGNESIUM: 2 mg/dL (ref 1.7–2.4)

## 2018-06-14 LAB — TSH: TSH: 0.84 u[IU]/mL (ref 0.350–4.500)

## 2018-06-14 SURGERY — LEFT HEART CATH AND CORONARY ANGIOGRAPHY
Anesthesia: LOCAL

## 2018-06-14 MED ORDER — FENTANYL CITRATE (PF) 100 MCG/2ML IJ SOLN
INTRAMUSCULAR | Status: AC
  Filled 2018-06-14: qty 2

## 2018-06-14 MED ORDER — CLOPIDOGREL BISULFATE 75 MG PO TABS: 75.0000 mg | ORAL_TABLET | Freq: Every day | ORAL | Status: DC

## 2018-06-14 MED ORDER — LIDOCAINE HCL (PF) 1 % IJ SOLN
INTRAMUSCULAR | Status: DC | PRN
  Administered 2018-06-14: 2 mL

## 2018-06-14 MED ORDER — VERAPAMIL HCL 2.5 MG/ML IV SOLN
INTRAVENOUS | Status: DC | PRN
  Administered 2018-06-14: 10 mL via INTRA_ARTERIAL

## 2018-06-14 MED ORDER — HEPARIN (PORCINE) IN NACL 1000-0.9 UT/500ML-% IV SOLN
INTRAVENOUS | Status: AC
  Filled 2018-06-14: qty 1000

## 2018-06-14 MED ORDER — ONDANSETRON HCL 4 MG/2ML IJ SOLN: 4.0000 mg | Freq: Four times a day (QID) | INTRAMUSCULAR | Status: DC | PRN

## 2018-06-14 MED ORDER — SODIUM CHLORIDE 0.9% FLUSH: 3.0000 mL | INTRAVENOUS | Status: DC | PRN

## 2018-06-14 MED ORDER — METOPROLOL SUCCINATE ER 25 MG PO TB24
50.0000 mg | ORAL_TABLET | Freq: Every day | ORAL | 3 refills | Status: DC
Start: 2018-06-14 — End: 2019-01-17

## 2018-06-14 MED ORDER — HEPARIN SODIUM (PORCINE) 1000 UNIT/ML IJ SOLN
INTRAMUSCULAR | Status: DC | PRN
  Administered 2018-06-14: 3500 [IU] via INTRAVENOUS

## 2018-06-14 MED ORDER — IOPAMIDOL (ISOVUE-370) INJECTION 76%
INTRAVENOUS | Status: DC | PRN
  Administered 2018-06-14: 60 mL

## 2018-06-14 MED ORDER — VERAPAMIL HCL 2.5 MG/ML IV SOLN
INTRAVENOUS | Status: AC
  Filled 2018-06-14: qty 2

## 2018-06-14 MED ORDER — MIDAZOLAM HCL 2 MG/2ML IJ SOLN
INTRAMUSCULAR | Status: AC
  Filled 2018-06-14: qty 2

## 2018-06-14 MED ORDER — HEPARIN (PORCINE) IN NACL 1000-0.9 UT/500ML-% IV SOLN
INTRAVENOUS | Status: DC | PRN
  Administered 2018-06-14 (×2): 500 mL

## 2018-06-14 MED ORDER — ASPIRIN 81 MG PO CHEW: 81.0000 mg | CHEWABLE_TABLET | Freq: Every day | ORAL | Status: DC

## 2018-06-14 MED ORDER — LIDOCAINE HCL (PF) 1 % IJ SOLN
INTRAMUSCULAR | Status: AC
  Filled 2018-06-14: qty 30

## 2018-06-14 MED ORDER — DIAZEPAM 5 MG PO TABS
5.0000 mg | ORAL_TABLET | Freq: Four times a day (QID) | ORAL | Status: DC | PRN
Start: 2018-06-14 — End: 2018-06-14

## 2018-06-14 MED ORDER — SODIUM CHLORIDE 0.9% FLUSH: 3.0000 mL | Freq: Two times a day (BID) | INTRAVENOUS | Status: DC

## 2018-06-14 MED ORDER — HEPARIN SODIUM (PORCINE) 1000 UNIT/ML IJ SOLN
INTRAMUSCULAR | Status: AC
  Filled 2018-06-14: qty 1

## 2018-06-14 MED ORDER — SODIUM CHLORIDE 0.9 % IV SOLN: INTRAVENOUS | Status: DC

## 2018-06-14 MED ORDER — ACETAMINOPHEN 325 MG PO TABS: 650.0000 mg | ORAL_TABLET | ORAL | Status: DC | PRN

## 2018-06-14 MED ORDER — MIDAZOLAM HCL 2 MG/2ML IJ SOLN
INTRAMUSCULAR | Status: DC | PRN
  Administered 2018-06-14: 2 mg via INTRAVENOUS

## 2018-06-14 MED ORDER — METOPROLOL SUCCINATE ER 50 MG PO TB24: 50.0000 mg | ORAL_TABLET | Freq: Every day | ORAL | Status: DC

## 2018-06-14 MED ORDER — SODIUM CHLORIDE 0.9 % IV SOLN: 250.0000 mL | INTRAVENOUS | Status: DC | PRN

## 2018-06-14 MED ORDER — FENTANYL CITRATE (PF) 100 MCG/2ML IJ SOLN
INTRAMUSCULAR | Status: DC | PRN
  Administered 2018-06-14 (×2): 25 ug via INTRAVENOUS

## 2018-06-14 SURGICAL SUPPLY — 13 items
CATH INFINITI 5FR ANG PIGTAIL (CATHETERS) ×2 IMPLANT
CATH OPTITORQUE TIG 4.0 5F (CATHETERS) ×2 IMPLANT
COVER PRB 48X5XTLSCP FOLD TPE (BAG) ×1 IMPLANT
COVER PROBE 5X48 (BAG) ×1
DEVICE RAD COMP TR BAND LRG (VASCULAR PRODUCTS) ×2 IMPLANT
GLIDESHEATH SLEND SS 6F .021 (SHEATH) ×2 IMPLANT
GUIDEWIRE INQWIRE 1.5J.035X260 (WIRE) ×1 IMPLANT
INQWIRE 1.5J .035X260CM (WIRE) ×2
KIT HEART LEFT (KITS) ×2 IMPLANT
PACK CARDIAC CATHETERIZATION (CUSTOM PROCEDURE TRAY) ×2 IMPLANT
SYR MEDRAD MARK V 150ML (SYRINGE) ×2 IMPLANT
TRANSDUCER W/STOPCOCK (MISCELLANEOUS) ×2 IMPLANT
TUBING CIL FLEX 10 FLL-RA (TUBING) ×2 IMPLANT

## 2018-06-14 NOTE — Progress Notes (Signed)
ANTICOAGULATION CONSULT NOTE  Pharmacy Consult:  Heparin Indication: chest pain/ACS   Patient Measurements: Height: 5\' 8"  (172.7 cm) Weight: 152 lb 14.4 oz (69.4 kg) IBW/kg (Calculated) : 68.4 Heparin Dosing Weight: 70 kg  Vital Signs: Temp: 98.5 F (36.9 C) (07/16 0750) Temp Source: Oral (07/16 0750) BP: 133/79 (07/16 0750) Pulse Rate: 75 (07/16 0750)   Labs: Recent Labs    06/13/18 1407 06/13/18 2146 06/13/18 2300 06/14/18 0334 06/14/18 0645 06/14/18 0649  HGB 13.2 14.0  --   --   --  12.7*  HCT 41.6 44.2  --   --   --  40.1  PLT 197 200  --   --   --  180  LABPROT  --  13.8  --   --   --   --   INR  --  1.07  --   --   --   --   HEPARINUNFRC  --   --  0.21*  --  0.34  --   CREATININE 0.92 1.01  --  0.93  --   --   TROPONINI  --  0.03*  --  0.03*  --   --      Assessment: 62 YOM continues on IV heparin for ACS.  Heparin level is therapeutic and at the low end of normal.  No bleeding reported.   Goal of Therapy:  Heparin level 0.3-0.7 units/ml Monitor platelets by anticoagulation protocol: Yes    Plan:  Increase heparin gtt to 1150 units/hr Daily heparin level and CBC   Franciszek Platten D. Mina Marble, PharmD, BCPS, [place] 06/14/2018, 8:42 AM

## 2018-06-14 NOTE — Progress Notes (Signed)
Patient back to unit via stretcher from cath lab awake and oriented. Family at bedside. Pt makes no c/o pain.  TR band in place to right wrist at level 0 currently. V/S being obtained sequentially and linked.

## 2018-06-14 NOTE — Discharge Summary (Signed)
Discharge Summary    Patient ID: Donald Jennings,  MRN: [id number], DOB/AGE: 61-May-1957 61 y.o.  Admit date: 06/13/2018 Discharge date: 06/14/2018  Primary Care Provider: Leanna Battles Primary Cardiologist: Dr. Radford Pax   Discharge Diagnoses    Active Problems:   Unstable angina ([place])   Allergies Allergies  Allergen Reactions  . Pork-Derived Products     Diagnostic Studies/Procedures    Cath: 06/14/18   Previously placed Prox LAD to Mid LAD stent (unknown type) is widely patent.  Mid RCA lesion is 40% stenosed.  Prox RCA lesion is 20% stenosed.  Post Atrio lesion is 40% stenosed.  Previously placed Dist RCA stent (unknown type) is widely patent.  The left ventricular ejection fraction is greater than 65% by visual estimate.  The left ventricular systolic function is normal.  LV end diastolic pressure is normal.   Hyperdynamic LV function with an ejection fraction of 65 to 70% without focal segmental wall motion abnormalities.  Widely patent previously placed stents in the mid LAD after the first diagonal vessel and in the distal RCA.  The LAD tapers to a very small caliber vessel and ends just proximal to the apex.  The ramus intermediate, and left circumflex vessel are angiographically normal.  The RCA is a dominant vessel with mild luminal irregularity diffusely with 20% proximal stenosis, 40% mid stenosis.  The stent in the distal RCA after the acute margin and proximal to the PDA is widely patent.  There is a 40% continuation branch stenosis to the PLA vessel which actually is somewhat improved from the last catheterization.  RECOMMENDATION: Medical therapy.  With the patient's positive ECG changes on the stress test with suggestion of possible apical ischemia will further titrate beta-blocker therapy and increase Toprol-XL to 50 mg daily.  Recommend continuation of uninterrupted dual antiplatelet therapy with aspirin/Plavix. _____________   History of  Present Illness     62 y.o.malewith a hx of CAD, HTN, HLD and prior h/o TIA, who presentsto clinic todayfornuclear stress test.He was admitted to [facility] Sep[facility] LHC given symptoms concerning for unstable angina. He underwentLHC with Dr. Burt Knack on 9/5/18and found to havetotal occlusion of the mLAD with left to left collaterals treated with PCI/DES x1, and 75% lesion in the distal RCA treated with PCI/DES x1. Normal LV function and LVEDP.He was placed onDAPT with ASA/plavix.Also discharged home on a statin, beta blocker and ARB.  He was seen by Lyda Jester on 05/26/2018 complaining of jaw and gum pain. This occurred while he was in bed and was similar to what he has had with his angina in the past but only lasted for a few seconds. Did not complain of any chest pain at that time. He did not let the nuclear tech and exercise physiologist know that he had had chest pain this past week and proceeded with nuclear stress test. Nuclear stress test showed a small fixed defect in the apex with a larger peri-infarct ischemia. After reviewing the nuclear stress test and talking with the patient he reported that last week he had chest pain for 4 days but did not seek medical attention. During the stress test he had at least 1.5 mm of horizontal ST segment depression and now with a new defect in the apex and anterior apical region. Given this finding, he was sent to the ED for admission and planned for cardiac cath.   Hospital Course      Underwent cardiac cath noted above with continued patency of  stent placed to the LAD back in 9/18. No significant change in cath. He reported feeling well post cath. He will be continued on DAPT with ASA/plavix. Dr. Claiborne Billings recommended to increase his Toprol to 50mg  daily. He was continued on his other home medications without any change. Radial site stable prior to discharge. Instructions/precautions given.   General: Well developed, well nourished, male  appearing in no acute distress. Head: Normocephalic, atraumatic.  Neck: Supple without bruits, JVD. Lungs:  Resp regular and unlabored, CTA. Heart: RRR, S1, S2, no S3, S4, or murmur; no rub. Abdomen: Soft, non-tender, non-distended with normoactive bowel sounds. No hepatomegaly. No rebound/guarding. No obvious abdominal masses. Extremities: No clubbing, cyanosis, edema. Distal pedal pulses are 2+ bilaterally. R radial cath site stable without bruising or hematoma Neuro: Alert and oriented X 3. Moves all extremities spontaneously. Psych: Normal affect.  Loraine Grip Vandall was seen by Dr. Irish Lack and determined stable for discharge home. Follow up in the office has been arranged. Medications are listed below.   _____________  Discharge Vitals Blood pressure 126/67, pulse 63, temperature 97.8 F (36.6 C), temperature source Oral, resp. rate 12, height 5\' 8"  (1.727 m), weight 152 lb 14.4 oz (69.4 kg), SpO2 98 %.  Filed Weights   06/13/18 2047 06/14/18 0436  Weight: 153 lb 9.6 oz (69.7 kg) 152 lb 14.4 oz (69.4 kg)    Labs & Radiologic Studies    CBC Recent Labs    06/13/18 2146 06/14/18 0649  WBC 7.6 7.9  NEUTROABS 4.4  --   HGB 14.0 12.7*  HCT 44.2 40.1  MCV 88.4 87.0  PLT 200 932   Basic Metabolic Panel Recent Labs    06/13/18 2146 06/14/18 0334  NA 143 142  K 3.3* 4.0  CL 105 109  CO2 28 24  GLUCOSE 83 101*  BUN 17 19  CREATININE 1.01 0.93  CALCIUM 9.4 9.4  MG 2.0  --    Liver Function Tests Recent Labs    06/13/18 2146  AST 20  ALT 17  ALKPHOS 52  BILITOT 0.5  PROT 6.8  ALBUMIN 4.0   No results for input(s): LIPASE, AMYLASE in the last 72 hours. Cardiac Enzymes Recent Labs    06/13/18 2146 06/14/18 0334 06/14/18 0806  TROPONINI 0.03* 0.03* 0.03*   BNP Invalid input(s): POCBNP D-Dimer No results for input(s): DDIMER in the last 72 hours. Hemoglobin A1C Recent Labs    06/13/18 2146  HGBA1C 6.3*   Fasting Lipid Panel Recent Labs     06/14/18 0334  CHOL 100  HDL 33*  LDLCALC 46  TRIG 104  CHOLHDL 3.0   Thyroid Function Tests Recent Labs    06/13/18 2146  TSH 0.840   _____________  Dg Chest 2 View  Result Date: 06/13/2018 CLINICAL DATA:  Chest pain, LEFT jaw tingling. History of cardiac stent. Smoker. EXAM: CHEST - 2 VIEW COMPARISON:  Chest radiograph February 17, 2017 FINDINGS: Cardiomediastinal silhouette is normal. Coronary artery stent. Mild bronchitic changes. No pleural effusions or focal consolidations. Trachea projects midline and there is no pneumothorax. Soft tissue planes and included osseous structures are non-suspicious. Mild degenerative change of the thoracic spine. IMPRESSION: Mild bronchitic changes without focal consolidation. Electronically Signed   By: Elon Alas M.D.   On: 06/13/2018 14:23   Disposition   Pt is being discharged home today in good condition.  Follow-up Plans & Appointments    Follow-up Information    Leanna Battles, MD.   Specialty:  Internal Medicine Why:  DJ will call patient Contact information: [place] [address] [phone]        Imogene Burn, PA-C Follow up on 07/13/2018.   Specialty:  Cardiology Why:  at 11:30am for your follow up appt.  Contact information: [address] [place] [address] [phone]           Discharge Medications     Medication List    TAKE these medications   aspirin EC 81 MG tablet Take 1 tablet (81 mg total) by mouth daily. What changed:  when to take this   clopidogrel 75 MG tablet Commonly known as:  PLAVIX TAKE 1 TABLET (75 MG TOTAL) BY MOUTH DAILY WITH BREAKFAST. What changed:  when to take this   diclofenac sodium 1 % Gel Commonly known as:  VOLTAREN Apply 1 application topically as needed for pain.   lidocaine 5 % Commonly known as:  LIDODERM Place 1 patch onto the skin daily as needed (pain). Remove & Discard patch within 12 hours or as directed by MD    losartan 100 MG tablet Commonly known as:  COZAAR Take 1 tablet (100 mg total) by mouth daily. What changed:  when to take this   metoprolol succinate 25 MG 24 hr tablet Commonly known as:  TOPROL-XL Take 2 tablets (50 mg total) by mouth daily. What changed:  how much to take   MULTIVITAMIN ADULT Tabs Take 1 tablet by mouth at bedtime.   nitroGLYCERIN 0.4 MG SL tablet Commonly known as:  NITROSTAT Place 1 tablet (0.4 mg total) under the tongue every 5 (five) minutes as needed.   pantoprazole 40 MG tablet Commonly known as:  PROTONIX TAKE 1 TABLET BY MOUTH EVERY DAY   rosuvastatin 40 MG tablet Commonly known as:  CRESTOR Take 1 tablet (40 mg total) by mouth daily. What changed:  when to take this   varenicline 1 MG tablet Commonly known as:  CHANTIX CONTINUING MONTH PAK Take 1 tablet (1 mg total) by mouth 2 (two) times daily.   Vitamin D (Ergocalciferol) 50000 units Caps capsule Commonly known as:  DRISDOL TAKE 1 CAPSULE (50,000 UNITS TOTAL) BY MOUTH ONCE A WEEK. What changed:  when to take this        Acute coronary syndrome (MI, NSTEMI, STEMI, etc) this admission?: No.   Outstanding Labs/Studies   N/a   Duration of Discharge Encounter   Greater than 30 minutes including physician time.  Signed, Reino Bellis NP-C 06/14/2018, 4:31 PM

## 2018-06-14 NOTE — Progress Notes (Signed)
4 cc of air reinserted to TR band for level 1 bleeding after 3 cc removed at 11:00.  Will continue to monitor.

## 2018-06-14 NOTE — Progress Notes (Signed)
TR band removed. Gauze and tegrederm placed. Pt has orders to be discharged. Discharge instructions given and pt has no additional questions at this time. Medication regimen reviewed and pt educated. Pt verbalized understanding and has no additional questions. Telemetry box removed. IV removed and site in good condition. Pt stable and waiting for transportation.

## 2018-06-14 NOTE — Progress Notes (Signed)
Patient declines to sign consent at this time. Wishes to do so down in cath lab when he speaks to cardiologist. No other complaints at this time. Will continue to monitor patient.

## 2018-06-14 NOTE — Interval H&P Note (Signed)
Cath Lab Visit (complete for each Cath Lab visit)  Clinical Evaluation Leading to the Procedure:   ACS: No.  Non-ACS:    Anginal Classification: CCS III  Anti-ischemic medical therapy: Maximal Therapy (2 or more classes of medications)  Non-Invasive Test Results: Intermediate-risk stress test findings: cardiac mortality 1-3%/year  Prior CABG: No previous CABG      History and Physical Interval Note:  06/14/2018 9:06 AM  Donald Jennings  has presented today for surgery, with the diagnosis of abnormal nuc  The various methods of treatment have been discussed with the patient and family. After consideration of risks, benefits and other options for treatment, the patient has consented to  Procedure(s): LEFT HEART CATH AND CORONARY ANGIOGRAPHY (N/A) as a surgical intervention .  [name] history has been reviewed, patient examined, no change in status, stable for surgery.  I have reviewed the patient's chart and labs.  Questions were answered to the patient's satisfaction.     Shelva Majestic

## 2018-06-26 ENCOUNTER — Other Ambulatory Visit: Payer: Self-pay | Admitting: Cardiology

## 2018-07-13 ENCOUNTER — Ambulatory Visit: Payer: Federal, State, Local not specified - PPO | Admitting: Physician Assistant

## 2018-07-18 DIAGNOSIS — G894 Chronic pain syndrome: Secondary | ICD-10-CM | POA: Diagnosis not present

## 2018-07-20 ENCOUNTER — Institutional Professional Consult (permissible substitution): Payer: Self-pay | Admitting: Neurology

## 2018-07-29 ENCOUNTER — Other Ambulatory Visit: Payer: Self-pay | Admitting: Cardiology

## 2018-08-09 ENCOUNTER — Telehealth: Payer: Self-pay | Admitting: Acute Care

## 2018-08-09 DIAGNOSIS — F17210 Nicotine dependence, cigarettes, uncomplicated: Secondary | ICD-10-CM

## 2018-08-09 DIAGNOSIS — Z122 Encounter for screening for malignant neoplasm of respiratory organs: Secondary | ICD-10-CM

## 2018-08-11 NOTE — Telephone Encounter (Signed)
LVM for patient to return call to schedule this CT °

## 2018-08-11 NOTE — Telephone Encounter (Signed)
Patient has been scheduled to do LCS CT on 08/24/2018 @ 9:30am and he is aware of appt and location

## 2018-08-11 NOTE — Telephone Encounter (Signed)
Rodena Piety, I have placed a new order for LDCT.  Can you contact pt to schedule?  Thanks

## 2018-08-24 ENCOUNTER — Ambulatory Visit (INDEPENDENT_AMBULATORY_CARE_PROVIDER_SITE_OTHER)
Admission: RE | Admit: 2018-08-24 | Discharge: 2018-08-24 | Disposition: A | Discharge status: Home / Self Care | Payer: Federal, State, Local not specified - PPO | Source: Ambulatory Visit | Attending: Acute Care | Admitting: Acute Care

## 2018-08-24 DIAGNOSIS — F17210 Nicotine dependence, cigarettes, uncomplicated: Secondary | ICD-10-CM

## 2018-08-24 DIAGNOSIS — Z122 Encounter for screening for malignant neoplasm of respiratory organs: Secondary | ICD-10-CM

## 2018-08-29 ENCOUNTER — Other Ambulatory Visit: Payer: Self-pay | Admitting: Acute Care

## 2018-08-29 DIAGNOSIS — F17210 Nicotine dependence, cigarettes, uncomplicated: Secondary | ICD-10-CM

## 2018-08-29 DIAGNOSIS — Z122 Encounter for screening for malignant neoplasm of respiratory organs: Secondary | ICD-10-CM

## 2018-09-02 DIAGNOSIS — Z9861 Coronary angioplasty status: Secondary | ICD-10-CM | POA: Diagnosis not present

## 2018-09-02 DIAGNOSIS — I10 Essential (primary) hypertension: Secondary | ICD-10-CM | POA: Diagnosis not present

## 2018-09-02 DIAGNOSIS — E119 Type 2 diabetes mellitus without complications: Secondary | ICD-10-CM | POA: Diagnosis not present

## 2018-09-02 DIAGNOSIS — M5489 Other dorsalgia: Secondary | ICD-10-CM | POA: Diagnosis not present

## 2018-09-02 DIAGNOSIS — Z23 Encounter for immunization: Secondary | ICD-10-CM | POA: Diagnosis not present

## 2018-09-05 ENCOUNTER — Encounter: Payer: Self-pay | Admitting: Cardiology

## 2018-09-05 ENCOUNTER — Other Ambulatory Visit: Payer: Self-pay | Admitting: Internal Medicine

## 2018-09-05 ENCOUNTER — Ambulatory Visit: Payer: Federal, State, Local not specified - PPO | Admitting: Cardiology

## 2018-09-05 VITALS — BP 128/56 | HR 68 | Ht 68.0 in | Wt 156.6 lb

## 2018-09-05 DIAGNOSIS — I2583 Coronary atherosclerosis due to lipid rich plaque: Secondary | ICD-10-CM

## 2018-09-05 DIAGNOSIS — I2510 Atherosclerotic heart disease of native coronary artery without angina pectoris: Secondary | ICD-10-CM | POA: Diagnosis not present

## 2018-09-05 DIAGNOSIS — I10 Essential (primary) hypertension: Secondary | ICD-10-CM

## 2018-09-05 DIAGNOSIS — E7800 Pure hypercholesterolemia, unspecified: Secondary | ICD-10-CM | POA: Diagnosis not present

## 2018-09-05 DIAGNOSIS — M549 Dorsalgia, unspecified: Secondary | ICD-10-CM

## 2018-09-05 DIAGNOSIS — I6523 Occlusion and stenosis of bilateral carotid arteries: Secondary | ICD-10-CM | POA: Diagnosis not present

## 2018-09-05 NOTE — Patient Instructions (Signed)
Medication Instructions:  Your physician recommends that you continue on your current medications as directed. Please refer to the Current Medication list given to you today.  If you need a refill on your cardiac medications before your next appointment, please call your pharmacy.   Lab work: None If you have labs (blood work) drawn today and your tests are completely normal, you will receive your results only by: [name] MyChart Message (if you have MyChart) OR . A paper copy in the mail If you have any lab test that is abnormal or we need to change your treatment, we will call you to review the results.  Testing/Procedures: Your physician has requested that you have a carotid duplex. This test is an ultrasound of the carotid arteries in your neck. It looks at blood flow through these arteries that supply the brain with blood. Allow one hour for this exam. There are no restrictions or special instructions.   Follow-Up: Your physician wants you to follow-up in: 6 months with PA.   At [facility], you and your health needs are our priority.  As part of our continuing mission to provide you with exceptional heart care, we have created designated Provider Care Teams.  These Care Teams include your primary Cardiologist (physician) and Advanced Practice Providers (APPs -  Physician Assistants and Nurse Practitioners) who all work together to provide you with the care you need, when you need it. You will need a follow up appointment in 1 years.  Please call our office 2 months in advance to schedule this appointment.  You may see Fransico Him, MD or one of the following Advanced Practice Providers on your designated Care Team:   [place], PA-C Melina Copa, PA-C . Ermalinda Barrios, PA-C

## 2018-09-05 NOTE — Progress Notes (Signed)
Cardiology Office Note:    Date:  09/05/2018   ID:  Loraine Grip Madril, DOB 11/15/1956, MRN [id number]  PCP:  Leanna Battles, MD  Cardiologist:  Fransico Him, MD    Referring MD: Janith Lima, MD   Chief Complaint  Patient presents with  . Coronary Artery Disease  . Hypertension  . Hyperlipidemia    History of Present Illness:    Donald Jennings is a 62 y.o. male with a hx of CAD, HTN, HLD and ASCAD with cath 07/2017 showing total occlusion of the mLAD with left to left collaterals treated with PCI/DES x1, and 75% lesion in the distal RCA treated with PCI/DES x1. Normal LV function and LVEDP.He was placed onDAPT with ASA/plavix.Also discharged home on a statin, beta blocker and ARB.  He developed recurrent jaw and gum pain in June similar to his angina in the past with abnormal stress test showing 1.5 mm horizontal ST depression and a new apical and anterior apical defects on Myoview imaging.    He was readmitted to the hospital in July 2019 with cath showing no significant change from prior cath.  His Toprol was increased.  He is here today for followup and is doing well.  He denies any chest pain or pressure, SOB, DOE, PND, orthopnea, LE edema, dizziness, palpitations or syncope. He is compliant with his meds and is tolerating meds with no SE.    Past Medical History:  Diagnosis Date  . Abnormal nuclear stress test 06/13/2018  . Abnormal thallium stress test 05/20/2016  . Amaurosis fugax 06/03/2016  . CAD (coronary artery disease), native coronary artery 07/27/2017   < 40% prox LAD, 50% mid LAD and 40% distal RCA  . Carotid artery stenosis 07/27/2017   1-39% bilateral  . CERVICAL RADICULOPATHY 07/02/2010   Qualifier: Diagnosis of  By: Ronnald Ramp MD, Arvid Right.   . Coronary artery disease due to lipid rich plaque   . Coronary artery disease with exertional angina ([place]) 08/04/2017  . DEPRESSION 03/21/2009   Qualifier: Diagnosis of  By: Ronnald Ramp MD, Arvid Right.   . Diabetes mellitus without complication  ([place])   . Dysphagia 05/18/2016  . Eczema, dyshidrotic 05/27/2015  . ELECTROCARDIOGRAM, ABNORMAL 03/21/2009  . Essential hypertension 06/19/2009       . GERD 06/19/2009   Qualifier: Diagnosis of  By: Ronnald Ramp MD, Arvid Right.   [name] HLD (hyperlipidemia)   . Hx of cardiovascular stress test    ETT-Myoview (03/2014):  No ischemia, EF 56%; Low Risk  . Hyperlipidemia LDL goal <70 06/03/2016  . Lytic bone lesions on xray 02/22/2017  . Neuropathy of both feet 05/28/2015  . Pure hypercholesterolemia   . Routine general medical examination at a health care facility 04/04/2013  . SHOULDER INJURY, RIGHT 07/02/2010   Qualifier: Diagnosis of  By: Ronnald Ramp MD, Arvid Right.   . SOB (shortness of breath) 06/10/2016  . Solitary plasmacytoma of bone ([place]) 03/05/2017  . TOBACCO USE 03/21/2009   Qualifier: Diagnosis of  By: Ronnald Ramp MD, Arvid Right.   . Type 2 diabetes mellitus with manifestations ([place]) 03/27/2015  . Unstable angina ([place])   . Vertebral artery stenosis, asymptomatic 06/22/2016  . Vitamin D deficiency 04/04/2013    Past Surgical History:  Procedure Laterality Date  . CARDIAC CATHETERIZATION    . CATARACT EXTRACTION Bilateral    Left 2009 at [facility], Right 2004 in [place]  . COLONOSCOPY  07/25/2015   Dr Carol Ada  . CORONARY STENT INTERVENTION N/A 08/04/2017   Procedure:  CORONARY STENT INTERVENTION;  Surgeon: Sherren Mocha, MD;  Location: [place] [facility];  Service: Cardiovascular;  Laterality: N/A;  . INGUINAL HERNIA REPAIR Right 1986  . IR FLUORO GUIDED NEEDLE PLC ASPIRATION/INJECTION LOC  03/02/2017  . IR FLUORO GUIDED NEEDLE PLC ASPIRATION/INJECTION LOC  03/02/2017  . IR FLUORO GUIDED NEEDLE PLC ASPIRATION/INJECTION LOC  03/02/2017  . IR RADIOLOGIST EVAL & MGMT  03/01/2017  . LEFT HEART CATH AND CORONARY ANGIOGRAPHY N/A 08/04/2017   Procedure: LEFT HEART CATH AND CORONARY ANGIOGRAPHY;  Surgeon: Sherren Mocha, MD;  Location: [place] [facility];  Service: Cardiovascular;  Laterality: N/A;  . LEFT HEART CATH AND  CORONARY ANGIOGRAPHY N/A 06/14/2018   Procedure: LEFT HEART CATH AND CORONARY ANGIOGRAPHY;  Surgeon: Troy Sine, MD;  Location: [place] [facility];  Service: Cardiovascular;  Laterality: N/A;    Current Medications: Current Meds  Medication Sig  . aspirin EC 81 MG tablet Take 81 mg by mouth at bedtime.  . clopidogrel (PLAVIX) 75 MG tablet Take 1 tablet (75 mg total) by mouth at bedtime.  . diclofenac sodium (VOLTAREN) 1 % GEL Apply 1 application topically as needed for pain.  [name] lidocaine (LIDODERM) 5 % Place 1 patch onto the skin daily as needed (pain). Remove & Discard patch within 12 hours or as directed by MD   . losartan (COZAAR) 100 MG tablet Take 1 tablet (100 mg total) by mouth daily. (Patient taking differently: Take 100 mg by mouth at bedtime. )  . metoprolol succinate (TOPROL-XL) 25 MG 24 hr tablet Take 2 tablets (50 mg total) by mouth daily.  . Multiple Vitamins-Minerals (MULTIVITAMIN ADULT) TABS Take 1 tablet by mouth at bedtime.   . nitroGLYCERIN (NITROSTAT) 0.4 MG SL tablet Place 1 tablet (0.4 mg total) under the tongue every 5 (five) minutes as needed.  . pantoprazole (PROTONIX) 40 MG tablet Take 1 tablet (40 mg total) by mouth daily.  . rosuvastatin (CRESTOR) 40 MG tablet Take 1 tablet (40 mg total) by mouth daily. (Patient taking differently: Take 40 mg by mouth at bedtime. )  . varenicline (CHANTIX CONTINUING MONTH PAK) 1 MG tablet Take 1 tablet (1 mg total) by mouth 2 (two) times daily.  . Vitamin D, Ergocalciferol, (DRISDOL) 50000 units CAPS capsule TAKE 1 CAPSULE (50,000 UNITS TOTAL) BY MOUTH ONCE A WEEK. (Patient taking differently: Take 50,000 Units by mouth every Wednesday at 6 PM. )     Allergies:   Pork-derived products   Social History   Socioeconomic History  . Marital status: Married    Spouse name: Not on file  . Number of children: 1  . Years of education: Not on file  . Highest education level: Not on file  Occupational History  . Occupation:  Scientist, clinical (histocompatibility and immunogenetics)  Social Needs  . Financial resource strain: Not on file  . Food insecurity:    Worry: Not on file    Inability: Not on file  . Transportation needs:    Medical: Not on file    Non-medical: Not on file  Tobacco Use  . Smoking status: Current Every Day Smoker    Packs/day: 1.00    Years: 38.50    Pack years: 38.50    Types: Cigarettes  . Smokeless tobacco: Never Used  . Tobacco comment: Currently  smokes 1/2 ppd and is considering Chantix  Substance and Sexual Activity  . Alcohol use: No  . Drug use: No  . Sexual activity: Not on file  Lifestyle  . Physical activity:  Days per week: Not on file    Minutes per session: Not on file  . Stress: Not on file  Relationships  . Social connections:    Talks on phone: Not on file    Gets together: Not on file    Attends religious service: Not on file    Active member of club or organization: Not on file    Attends meetings of clubs or organizations: Not on file    Relationship status: Not on file  Other Topics Concern  . 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 There is no history of Cancer, Early death, Hypertension, Hyperlipidemia, or Kidney disease.  ROS:   Please see the history of present illness.    ROS  All other systems reviewed and negative.   EKGs/Labs/Other Studies Reviewed:    The following studies were reviewed today: Cardiac cath 05/2018  EKG:  EKG is not ordered today.    Recent Labs: 06/13/2018: ALT 17; Magnesium 2.0; TSH 0.840 06/14/2018: BUN 19; Creatinine, Ser 0.93; Hemoglobin 12.7; Platelets 180; Potassium 4.0; Sodium 142   Recent Lipid Panel    Component Value Date/Time   CHOL 100 06/14/2018 0334   CHOL 96 (L)  09/13/2017 0930   TRIG 104 06/14/2018 0334   HDL 33 (L) 06/14/2018 0334   HDL 33 (L) 09/13/2017 0930   CHOLHDL 3.0 06/14/2018 0334   VLDL 21 06/14/2018 0334   LDLCALC 46 06/14/2018 0334   LDLCALC 45 09/13/2017 0930   LDLDIRECT 167.4 03/21/2009 1030    Physical Exam:    VS:  BP (!) 128/56   Pulse 68   Ht 5\' 8"  (1.727 m)   Wt 156 lb 9.6 oz (71 kg)   SpO2 98%   BMI 23.81 kg/m     Wt Readings from Last 3 Encounters:  09/05/18 156 lb 9.6 oz (71 kg)  06/14/18 152 lb 14.4 oz (69.4 kg)  06/13/18 154 lb (69.9 kg)     GEN:  Well nourished, well developed in no acute distress HEENT: Normal NECK: No JVD; No carotid bruits LYMPHATICS: No lymphadenopathy CARDIAC: RRR, no murmurs, rubs, gallops RESPIRATORY:  Clear to auscultation without rales, wheezing or rhonchi  ABDOMEN: Soft, non-tender, non-distended MUSCULOSKELETAL:  No edema; No deformity  SKIN: Warm and dry NEUROLOGIC:  Alert and oriented x 3 PSYCHIATRIC:  Normal affect   ASSESSMENT:    1. Coronary artery disease due to lipid rich plaque   2. Bilateral carotid artery stenosis   3. Essential hypertension   4. Pure hypercholesterolemia    PLAN:    In order of problems listed above:  1.  ASCAD - cath 07/2017 showing total occlusion of the mLAD with left to left collaterals treated with PCI/DES x1, and 75% lesion in the distal RCA treated with PCI/DES x1.  Repeat cardiac cath 05/2018 for chest pain and abnormal nuclear stress test showed no change from prior cath a year ago with patent stents in the RCA and mid LAD.  There is a 40% PDA and 40% mid RCA.  His antianginal therapy was increased at that time.  He has not had any other anginal sx since I saw him last.  He will continue on  aspirin 81 mg daily, Plavix 75 mg daily and beta-blocker.  2.  Bilateral carotid artery stenosis - dopplers 05/2016 showed 1 to 39% stenosis.  I will repeat these to make sure there is been no progression.  Continue on aspirin and statin.  3.  HTN  - BP is controlled on exam.  He will continue on losartan 100 mg daily, Toprol-XL 50 mg daily.  Creatinine was normal at 0.93 on 06/14/2018.  4.  Hyperlipidemia -his LDL goal is less than 70.  His LDL was 46 on 06/14/2018.  He will continue on Crestor 40 mg daily.   Medication Adjustments/Labs and Tests Ordered: Current medicines are reviewed at length with the patient today.  Concerns regarding medicines are outlined above.  No orders of the defined types were placed in this encounter.  No orders of the defined types were placed in this encounter.   Signed, Fransico Him, MD  09/05/2018 9:40 AM    [place]

## 2018-09-09 ENCOUNTER — Ambulatory Visit (HOSPITAL_COMMUNITY)
Admission: RE | Admit: 2018-09-09 | Discharge: 2018-09-09 | Disposition: A | Discharge status: Home / Self Care | Payer: Federal, State, Local not specified - PPO | Source: Ambulatory Visit | Attending: Cardiovascular Disease | Admitting: Cardiovascular Disease

## 2018-09-09 DIAGNOSIS — I2583 Coronary atherosclerosis due to lipid rich plaque: Secondary | ICD-10-CM | POA: Diagnosis not present

## 2018-09-09 DIAGNOSIS — I6523 Occlusion and stenosis of bilateral carotid arteries: Secondary | ICD-10-CM

## 2018-09-09 DIAGNOSIS — I2510 Atherosclerotic heart disease of native coronary artery without angina pectoris: Secondary | ICD-10-CM

## 2018-09-26 ENCOUNTER — Other Ambulatory Visit: Payer: Self-pay | Admitting: Cardiology

## 2018-10-17 ENCOUNTER — Encounter: Payer: Self-pay | Admitting: Gastroenterology

## 2018-10-17 ENCOUNTER — Ambulatory Visit
Admission: RE | Admit: 2018-10-17 | Discharge: 2018-10-17 | Disposition: A | Discharge status: Home / Self Care | Payer: Federal, State, Local not specified - PPO | Source: Ambulatory Visit | Attending: Internal Medicine | Admitting: Internal Medicine

## 2018-10-17 DIAGNOSIS — M549 Dorsalgia, unspecified: Secondary | ICD-10-CM

## 2018-10-17 DIAGNOSIS — M48061 Spinal stenosis, lumbar region without neurogenic claudication: Secondary | ICD-10-CM | POA: Diagnosis not present

## 2018-10-17 DIAGNOSIS — M47814 Spondylosis without myelopathy or radiculopathy, thoracic region: Secondary | ICD-10-CM | POA: Diagnosis not present

## 2018-10-17 MED ORDER — GADOBENATE DIMEGLUMINE 529 MG/ML IV SOLN
14.0000 mL | Freq: Once | INTRAVENOUS | Status: AC | PRN
  Administered 2018-10-17: 14 mL via INTRAVENOUS

## 2018-10-21 ENCOUNTER — Encounter: Payer: Self-pay | Admitting: Internal Medicine

## 2018-10-21 ENCOUNTER — Other Ambulatory Visit: Payer: Self-pay | Admitting: Internal Medicine

## 2018-10-21 ENCOUNTER — Ambulatory Visit: Payer: Federal, State, Local not specified - PPO | Admitting: Internal Medicine

## 2018-10-21 VITALS — BP 124/66 | HR 74 | Ht 68.0 in | Wt 161.5 lb

## 2018-10-21 DIAGNOSIS — Z9861 Coronary angioplasty status: Secondary | ICD-10-CM

## 2018-10-21 DIAGNOSIS — Z7902 Long term (current) use of antithrombotics/antiplatelets: Secondary | ICD-10-CM

## 2018-10-21 DIAGNOSIS — M5136 Other intervertebral disc degeneration, lumbar region: Secondary | ICD-10-CM

## 2018-10-21 DIAGNOSIS — I2510 Atherosclerotic heart disease of native coronary artery without angina pectoris: Secondary | ICD-10-CM

## 2018-10-21 DIAGNOSIS — R1319 Other dysphagia: Secondary | ICD-10-CM

## 2018-10-21 DIAGNOSIS — R1310 Dysphagia, unspecified: Secondary | ICD-10-CM

## 2018-10-21 NOTE — Progress Notes (Signed)
Donald Jennings 62 y.o. Jan 22, 1956 [id number]  Assessment & Plan:   Encounter Diagnoses  Name Primary?  . Esophageal dysphagia Yes  . Long term current use of antithrombotics/antiplatelets -clopidogrel   . CAD S/P percutaneous coronary angioplasty    In 2017 I was suspicious of possible motility disturbance, he could have esophagogastric junction outlet obstruction or he could be developing achalasia, I am going to do a barium swallow.  We will see what that shows that could lead to manometry or repeat EGD and dilation.  He did get approximately 2 years of benefit from a dilation but of course now is on Plavix and we have to withdrawal that and if there is a longer lasting solution that would be advantageous.  Interesting also that he had a bit of food retention in the stomach in 2017, so he could have a gastric motility problem as well.  I appreciate the opportunity to care for this patient. CC: Leanna Battles, MD   Subjective:   Chief Complaint: Liquid dysphagia  HPI The patient is here for follow-up, last seen for dysphagia in 2017, with similar complaints.   He is describing liquid dysphagia, particularly in the mornings with water he has to take only very small sips of water for things to go down.  At night if unless he is very careful about eating very early and leaving many hours between eating and going to bed, he may regurgitate food at times.  Does not seem to have solid dysphagia except rarely.  When I saw him in 2017 he had similar symptoms for about 5 or 6 years.  He had been on omeprazole for reflux since 1991.  He had some chest pain with food impaction when it occurs or the liquids going down and not passing well.  No unintentional weight loss.  He is on pantoprazole now.  12/20/2015 I dilated him he had a little bit of a stenosis at the GE junction, and a tortuous esophagus and a small amount of food in the stomach despite fasting, 54 French Maloney dilator and he had  about 2 years of symptom relief he says.  Tums have returned over the past several months.  Since that time, he was placed on clopidogrel after a drug-eluting stent placed in the RCA in September 2018 he was admitted to the hospital in 2019 in the summer and had his Toprol increased with a stable unchanged catheterization but a diagnosis of unstable angina.  Allergies  Allergen Reactions  . Pork-Derived Products    Current Meds  Medication Sig  . aspirin EC 81 MG tablet Take 81 mg by mouth at bedtime.  . clopidogrel (PLAVIX) 75 MG tablet Take 1 tablet (75 mg total) by mouth at bedtime.  . diclofenac sodium (VOLTAREN) 1 % GEL Apply 1 application topically as needed for pain.  [name] lidocaine (LIDODERM) 5 % Place 1 patch onto the skin daily as needed (pain). Remove & Discard patch within 12 hours or as directed by MD   . losartan (COZAAR) 100 MG tablet Take 1 tablet (100 mg total) by mouth daily. (Patient taking differently: Take 100 mg by mouth at bedtime. )  . metoprolol succinate (TOPROL-XL) 25 MG 24 hr tablet Take 2 tablets (50 mg total) by mouth daily.  . Multiple Vitamins-Minerals (MULTIVITAMIN ADULT) TABS Take 1 tablet by mouth at bedtime.   . nitroGLYCERIN (NITROSTAT) 0.4 MG SL tablet Place 1 tablet (0.4 mg total) under the tongue every 5 (five) minutes as  needed.  . pantoprazole (PROTONIX) 40 MG tablet Take 1 tablet (40 mg total) by mouth daily.  . rosuvastatin (CRESTOR) 40 MG tablet TAKE 1 TABLET BY MOUTH EVERY DAY  . varenicline (CHANTIX CONTINUING MONTH PAK) 1 MG tablet Take 1 tablet (1 mg total) by mouth 2 (two) times daily.  . Vitamin D, Ergocalciferol, (DRISDOL) 50000 units CAPS capsule TAKE 1 CAPSULE (50,000 UNITS TOTAL) BY MOUTH ONCE A WEEK. (Patient taking differently: Take 50,000 Units by mouth every Wednesday at 6 PM. )   Past Medical History:  Diagnosis Date  . Abnormal nuclear stress test 06/13/2018  . Abnormal thallium stress test 05/20/2016  . Amaurosis fugax 06/03/2016  .  CAD (coronary artery disease), native coronary artery 07/27/2017   < 40% prox LAD, 50% mid LAD and 40% distal RCA  . Carotid artery stenosis 07/27/2017   1-39% bilateral  . CERVICAL RADICULOPATHY 07/02/2010   Qualifier: Diagnosis of  By: Ronnald Ramp MD, Arvid Right.   . Coronary artery disease due to lipid rich plaque   . Coronary artery disease with exertional angina ([place]) 08/04/2017  . DEPRESSION 03/21/2009   Qualifier: Diagnosis of  By: Ronnald Ramp MD, Arvid Right.   . Diabetes mellitus without complication ([place])   . Dysphagia 05/18/2016  . Eczema, dyshidrotic 05/27/2015  . ELECTROCARDIOGRAM, ABNORMAL 03/21/2009  . Essential hypertension 06/19/2009       . GERD 06/19/2009   Qualifier: Diagnosis of  By: Ronnald Ramp MD, Arvid Right.   [name] HLD (hyperlipidemia)   . Hx of cardiovascular stress test    ETT-Myoview (03/2014):  No ischemia, EF 56%; Low Risk  . Hyperlipidemia LDL goal <70 06/03/2016  . Lytic bone lesions on xray 02/22/2017  . Neuropathy of both feet 05/28/2015  . Pure hypercholesterolemia   . Routine general medical examination at a health care facility 04/04/2013  . SHOULDER INJURY, RIGHT 07/02/2010   Qualifier: Diagnosis of  By: Ronnald Ramp MD, Arvid Right.   . SOB (shortness of breath) 06/10/2016  . Solitary plasmacytoma of bone ([place]) 03/05/2017  . TOBACCO USE 03/21/2009   Qualifier: Diagnosis of  By: Ronnald Ramp MD, Arvid Right.   . Type 2 diabetes mellitus with manifestations ([place]) 03/27/2015  . Unstable angina ([place])   . Vertebral artery stenosis, asymptomatic 06/22/2016  . Vitamin D deficiency 04/04/2013   Past Surgical History:  Procedure Laterality Date  . CARDIAC CATHETERIZATION    . CATARACT EXTRACTION Bilateral    Left 2009 at [facility], Right 2004 in [place]  . COLONOSCOPY  07/25/2015   Dr Carol Ada  . CORONARY STENT INTERVENTION N/A 08/04/2017   Procedure: CORONARY STENT INTERVENTION;  Surgeon: Sherren Mocha, MD;  Location: [facility];  Service: Cardiovascular;  Laterality: N/A;  . INGUINAL HERNIA REPAIR Right  1986  . IR FLUORO GUIDED NEEDLE PLC ASPIRATION/INJECTION LOC  03/02/2017  . IR FLUORO GUIDED NEEDLE PLC ASPIRATION/INJECTION LOC  03/02/2017  . IR FLUORO GUIDED NEEDLE PLC ASPIRATION/INJECTION LOC  03/02/2017  . IR RADIOLOGIST EVAL & MGMT  03/01/2017  . LEFT HEART CATH AND CORONARY ANGIOGRAPHY N/A 08/04/2017   Procedure: LEFT HEART CATH AND CORONARY ANGIOGRAPHY;  Surgeon: Sherren Mocha, MD;  Location: [facility];  Service: Cardiovascular;  Laterality: N/A;  . LEFT HEART CATH AND CORONARY ANGIOGRAPHY N/A 06/14/2018   Procedure: LEFT HEART CATH AND CORONARY ANGIOGRAPHY;  Surgeon: Troy Sine, MD;  Location: [facility];  Service: Cardiovascular;  Laterality: N/A;   Social History   Social History Narrative   Married  1 sone born 68   From [place]   [employer]   2 caffeine drinks daily   Smoker, no alcohol no drugs   family history includes Arthritis in his mother; CAD in his brother, brother, and brother; Diabetes in his mother; Heart disease in his father; Hypertension in his father; Kidney Stones in his brother; Stroke in his brother and father.   Review of Systems See HPI  Objective:   Physical Exam BP 124/66   Pulse 74   Ht 5\' 8"  (1.727 m)   Wt 161 lb 8 oz (73.3 kg)   BMI 24.56 kg/m  No acute distress  15 minutes time spent with patient > half in counseling coordination of care

## 2018-10-21 NOTE — Patient Instructions (Addendum)
  You have been scheduled for a Barium Esophogram at [facility] on 10/24/18 at 11:30am. Please arrive 20 minutes prior to your appointment for registration. Make certain not to have anything to eat or drink 3 hours prior to your test. If you need to reschedule for any reason, please contact radiology at [phone] to do so. __________________________________________________________________ A barium swallow is an examination that concentrates on views of the esophagus. This tends to be a double contrast exam (barium and two liquids which, when combined, create a gas to distend the wall of the oesophagus) or single contrast (non-ionic iodine based). The study is usually tailored to your symptoms so a good history is essential. Attention is paid during the study to the form, structure and configuration of the esophagus, looking for functional disorders (such as aspiration, dysphagia, achalasia, motility and reflux) EXAMINATION You may be asked to change into a gown, depending on the type of swallow being performed. A radiologist and radiographer will perform the procedure. The radiologist will advise you of the type of contrast selected for your procedure and direct you during the exam. You will be asked to stand, sit or lie in several different positions and to hold a small amount of fluid in your mouth before being asked to swallow while the imaging is performed .In some instances you may be asked to swallow barium coated marshmallows to assess the motility of a solid food bolus. The exam can be recorded as a digital or video fluoroscopy procedure. POST PROCEDURE It will take 1-2 days for the barium to pass through your system. To facilitate this, it is important, unless otherwise directed, to increase your fluids for the next 24-48hrs and to resume your normal diet.  This test typically takes about 30 minutes to  perform. __________________________________________________________________________________  Donald Jennings may eat lightly.   I appreciate the opportunity to care for you. Silvano Rusk, MD, [facility]

## 2018-10-24 ENCOUNTER — Ambulatory Visit
Admission: RE | Admit: 2018-10-24 | Discharge: 2018-10-24 | Disposition: A | Discharge status: Home / Self Care | Payer: Federal, State, Local not specified - PPO | Source: Ambulatory Visit | Attending: Internal Medicine | Admitting: Internal Medicine

## 2018-10-24 DIAGNOSIS — R1319 Other dysphagia: Secondary | ICD-10-CM

## 2018-10-24 DIAGNOSIS — R1310 Dysphagia, unspecified: Secondary | ICD-10-CM | POA: Diagnosis not present

## 2018-10-30 NOTE — Progress Notes (Signed)
The ba swallow showed some retained fluid in the pharynx and was otherwise NL  I still suspect some motility issues (abnormal squeezing and relaxation of the esophagus)  Please explain this and that I recommend:   Schedule esophageal manometry to evaluate liquid dysphagia

## 2018-10-31 ENCOUNTER — Telehealth: Payer: Self-pay | Admitting: *Deleted

## 2018-10-31 NOTE — Telephone Encounter (Signed)
   [facility] Pre-operative Risk Assessment    Request for surgical clearance:  1. What type of surgery is being performed? LUMBAR ESI   2. When is this surgery scheduled? TBD   3. Are there any medications that need to be held prior to surgery and how long? PLAVIX X 5 DAYS PRIOR   4. Practice name and name of physician performing surgery? [facility];    5. What is your office phone and fax number? PH# [phone]; FAX # [phone]   4. Anesthesia type (None, local, MAC, general) ? CALLED AND LEFT MESSAGE TO VERIFY IF ANY ANESTHESIA BEING USED   Julaine Hua 10/31/2018, 4:40 PM  _________________________________________________________________   (provider comments below)

## 2018-11-01 NOTE — Telephone Encounter (Signed)
Ok to hold Plavix for 5 days prior to procedure - last stent 07/2017

## 2018-11-01 NOTE — Telephone Encounter (Signed)
Routing to Dr. Radford Pax regarding holding of Plavix.  Last stent 07/2017.   Dr. Radford Pax, please advised if ok to hold Plavix for 5 days.   Please route your response back to  [facility] [place], RN, [place] [address] [place], [place]  [address] [phone]

## 2018-11-01 NOTE — Telephone Encounter (Signed)
Local anesthesia will be used. 

## 2018-11-03 NOTE — Telephone Encounter (Signed)
   Primary Cardiologist: Fransico Him, MD  Chart reviewed as part of pre-operative protocol coverage. Patient was contacted 11/03/2018 in reference to pre-operative risk assessment for pending surgery as outlined below.  Donald Jennings was last seen on 09/05/18 by Dr. Radford Pax. I called patient to make sure he is doing well but got VM - but VM box was full. I called DPR spouse's number but got VM as well. LMOM to have patient call pre-op team back.   Charlie Pitter, PA-C 11/03/2018, 3:46 PM

## 2018-11-04 NOTE — Telephone Encounter (Signed)
    Primary Cardiologist:Traci Turner, MD  Chart reviewed as part of pre-operative protocol coverage. I called pt to discuss any new symptoms and he had an episode of chest pain during the night last week which responded to NTG. He feels it may have been indigestion. He also had chest soreness for a few days after he did some lifting. I feel that it would be best for him to come into the office for further evaluation. He is agreeable.    Pre-op covering staff: - Please schedule appointment and call patient to inform them. - Please contact requesting surgeon's office via preferred method (i.e, phone, fax) to inform them of need for appointment prior to surgery.  If applicable, this message will also be routed to pharmacy pool and/or primary cardiologist for input on holding anticoagulant/antiplatelet agent as requested below so that this information is available at time of patient's appointment.   Daune Perch, NP  11/04/2018, 10:31 AM

## 2018-11-04 NOTE — Telephone Encounter (Signed)
I will route this to Ermalinda Barrios, [place] for appt 11/08/18. Surgeon's office has been advised pt has appt 12/10 with Ermalinda Barrios and decision will be made if pt is cleared or needs further testing.

## 2018-11-04 NOTE — Telephone Encounter (Signed)
Per Pre Op protocol pt needs appt. Pt has been scheduled to see Ermalinda Barrios, PA 11/08/18 @ 2 pm. Pt is agreeable to appt. I will let surgeon's office know pt needed appt before being cleared. Pt thanked me for the call.

## 2018-11-08 ENCOUNTER — Ambulatory Visit (INDEPENDENT_AMBULATORY_CARE_PROVIDER_SITE_OTHER): Payer: Federal, State, Local not specified - PPO | Admitting: Physician Assistant

## 2018-11-08 ENCOUNTER — Encounter: Payer: Self-pay | Admitting: Physician Assistant

## 2018-11-08 VITALS — BP 108/62 | HR 77 | Ht 68.0 in | Wt 157.2 lb

## 2018-11-08 DIAGNOSIS — Z01818 Encounter for other preprocedural examination: Secondary | ICD-10-CM | POA: Diagnosis not present

## 2018-11-08 DIAGNOSIS — I2510 Atherosclerotic heart disease of native coronary artery without angina pectoris: Secondary | ICD-10-CM | POA: Diagnosis not present

## 2018-11-08 DIAGNOSIS — E7800 Pure hypercholesterolemia, unspecified: Secondary | ICD-10-CM | POA: Diagnosis not present

## 2018-11-08 DIAGNOSIS — I2583 Coronary atherosclerosis due to lipid rich plaque: Secondary | ICD-10-CM

## 2018-11-08 DIAGNOSIS — I10 Essential (primary) hypertension: Secondary | ICD-10-CM | POA: Diagnosis not present

## 2018-11-08 NOTE — Addendum Note (Signed)
Addended by: Carylon Perches on: 11/08/2018 03:13 PM   Modules accepted: Orders

## 2018-11-08 NOTE — Progress Notes (Signed)
Cardiology Office Note    Date:  11/08/2018   ID:  Donald Jennings [place], DOB Apr 05, 1956, MRN [id number]  PCP:  Leanna Battles, MD  Cardiologist: Fransico Him, MD EPS: None  Chief Complaint  Patient presents with  . Pre-op Exam    History of Present Illness:  Donald Jennings is a 62 y.o. male with history of CAD status post DES to the mid LAD with left to right collaterals and 75% lesion in the distal RCA treated with DES 07/2017 normal LV function.  Patient then developed chest pain 04/2018 and his jaw similar to his prior angina.  He had an abnormal stress test showing 1.5 mm horizontal ST depression and new apical and anterior apical defects on Myoview.  Repeat cath showed widely patent stents in the mid LAD.  The LAD tapers to a very small caliber vessel and ends just proximal to the apex.  Stent in the distal RCA widely patent.  With patient's positive EKG changes on stress test and suggestion of possible apical ischemia Toprol was increased.  Last saw Dr. Radford Pax 09/05/2018 at which time he was doing well.  Patient added on to my schedule today for preoperative clearance before undergoing lumbar surgery.  Dr. Radford Pax approved him to hold his Plavix 5 days prior to surgery if needed.  He did have an episode of chest pain 2 weeks that woke him from sleep. He had eaten spicy food late and had food coming up into throat, burning and took a NTG with relief. 4-5 days ago he pulled a chest muscle lifting something and has had constant pain. He slept with a heating pad last night and no further pain. Denies any chest tightness, pressure, or dyspnea like his previous angina. No exercise because of back problem. Walks 3-4 miles in a regular day with walking in his job.   Past Medical History:  Diagnosis Date  . Abnormal nuclear stress test 06/13/2018  . Abnormal thallium stress test 05/20/2016  . Amaurosis fugax 06/03/2016  . CAD (coronary artery disease), native coronary artery 07/27/2017   < 40% prox LAD, 50%  mid LAD and 40% distal RCA  . Carotid artery stenosis 07/27/2017   1-39% bilateral  . CERVICAL RADICULOPATHY 07/02/2010   Qualifier: Diagnosis of  By: Ronnald Ramp MD, Arvid Right.   . Coronary artery disease due to lipid rich plaque   . Coronary artery disease with exertional angina ([place]) 08/04/2017  . DEPRESSION 03/21/2009   Qualifier: Diagnosis of  By: Ronnald Ramp MD, Arvid Right.   . Diabetes mellitus without complication ([place])   . Dysphagia 05/18/2016  . Eczema, dyshidrotic 05/27/2015  . ELECTROCARDIOGRAM, ABNORMAL 03/21/2009  . Essential hypertension 06/19/2009       . GERD 06/19/2009   Qualifier: Diagnosis of  By: Ronnald Ramp MD, Arvid Right.   [name] HLD (hyperlipidemia)   . Hx of cardiovascular stress test    ETT-Myoview (03/2014):  No ischemia, EF 56%; Low Risk  . Hyperlipidemia LDL goal <70 06/03/2016  . Lytic bone lesions on xray 02/22/2017  . Neuropathy of both feet 05/28/2015  . Pure hypercholesterolemia   . Routine general medical examination at a health care facility 04/04/2013  . SHOULDER INJURY, RIGHT 07/02/2010   Qualifier: Diagnosis of  By: Ronnald Ramp MD, Arvid Right.   . SOB (shortness of breath) 06/10/2016  . Solitary plasmacytoma of bone ([place]) 03/05/2017  . TOBACCO USE 03/21/2009   Qualifier: Diagnosis of  By: Ronnald Ramp MD, Arvid Right.   . Type 2 diabetes mellitus  with manifestations ([place]) 03/27/2015  . Unstable angina ([place])   . Vertebral artery stenosis, asymptomatic 06/22/2016  . Vitamin D deficiency 04/04/2013    Past Surgical History:  Procedure Laterality Date  . CARDIAC CATHETERIZATION    . CATARACT EXTRACTION Bilateral    Left 2009 at [facility], Right 2004 in [place]  . COLONOSCOPY  07/25/2015   Dr Carol Ada  . CORONARY STENT INTERVENTION N/A 08/04/2017   Procedure: CORONARY STENT INTERVENTION;  Surgeon: Sherren Mocha, MD;  Location: [place] [facility];  Service: Cardiovascular;  Laterality: N/A;  . INGUINAL HERNIA REPAIR Right 1986  . IR FLUORO GUIDED NEEDLE PLC ASPIRATION/INJECTION LOC  03/02/2017  . IR  FLUORO GUIDED NEEDLE PLC ASPIRATION/INJECTION LOC  03/02/2017  . IR FLUORO GUIDED NEEDLE PLC ASPIRATION/INJECTION LOC  03/02/2017  . IR RADIOLOGIST EVAL & MGMT  03/01/2017  . LEFT HEART CATH AND CORONARY ANGIOGRAPHY N/A 08/04/2017   Procedure: LEFT HEART CATH AND CORONARY ANGIOGRAPHY;  Surgeon: Sherren Mocha, MD;  Location: [place] [facility];  Service: Cardiovascular;  Laterality: N/A;  . LEFT HEART CATH AND CORONARY ANGIOGRAPHY N/A 06/14/2018   Procedure: LEFT HEART CATH AND CORONARY ANGIOGRAPHY;  Surgeon: Troy Sine, MD;  Location: [place] [facility];  Service: Cardiovascular;  Laterality: N/A;    Current Medications: Current Meds  Medication Sig  . aspirin EC 81 MG tablet Take 81 mg by mouth at bedtime.  . clopidogrel (PLAVIX) 75 MG tablet Take 1 tablet (75 mg total) by mouth at bedtime.  . diclofenac sodium (VOLTAREN) 1 % GEL Apply 1 application topically as needed for pain.  [name] lidocaine (LIDODERM) 5 % Place 1 patch onto the skin daily as needed (pain). Remove & Discard patch within 12 hours or as directed by MD   . losartan (COZAAR) 100 MG tablet Take 1 tablet (100 mg total) by mouth daily. (Patient taking differently: Take 100 mg by mouth at bedtime. )  . metoprolol succinate (TOPROL-XL) 25 MG 24 hr tablet Take 2 tablets (50 mg total) by mouth daily.  . Multiple Vitamins-Minerals (MULTIVITAMIN ADULT) TABS Take 1 tablet by mouth at bedtime.   . nitroGLYCERIN (NITROSTAT) 0.4 MG SL tablet Place 1 tablet (0.4 mg total) under the tongue every 5 (five) minutes as needed.  . pantoprazole (PROTONIX) 40 MG tablet Take 1 tablet (40 mg total) by mouth daily.  . rosuvastatin (CRESTOR) 40 MG tablet TAKE 1 TABLET BY MOUTH EVERY DAY  . varenicline (CHANTIX CONTINUING MONTH PAK) 1 MG tablet Take 1 tablet (1 mg total) by mouth 2 (two) times daily.  . Vitamin D, Ergocalciferol, (DRISDOL) 50000 units CAPS capsule TAKE 1 CAPSULE (50,000 UNITS TOTAL) BY MOUTH ONCE A WEEK. (Patient taking differently: Take  50,000 Units by mouth every Wednesday at 6 PM. )     Allergies:   Pork-derived products   Social History   Socioeconomic History  . Marital status: Married    Spouse name: Not on file  . Number of children: 1  . Years of education: Not on file  . Highest education level: Not on file  Occupational History  . Occupation: Emergency planning/management officer: [employer]  . Financial resource strain: Not on file  . Food insecurity:    Worry: Not on file    Inability: Not on file  . Transportation needs:    Medical: Not on file    Non-medical: Not on file  Tobacco Use  . Smoking status: Current Every  Day Smoker    Packs/day: 1.00    Years: 38.50    Pack years: 38.50    Types: Cigarettes  . Smokeless tobacco: Never Used  . Tobacco comment: Currently  smokes 1/2 ppd and is considering Chantix  Substance and Sexual Activity  . Alcohol use: No  . Drug use: No  . Sexual activity: Not on file  Lifestyle  . Physical activity:    Days per week: Not on file    Minutes per session: Not on file  . Stress: Not on file  Relationships  . Social connections:    Talks on phone: Not on file    Gets together: Not on file    Attends religious service: Not on file    Active member of club or organization: Not on file    Attends meetings of clubs or organizations: Not on file    Relationship status: Not on file  Other Topics Concern  . Not on file  Social History Narrative   Married 1 sone born 1998   From [place]   Manager [employer]   2 caffeine drinks daily   Smoker, no alcohol no drugs     Family History:  [name] family history includes Arthritis in his mother; CAD in his brother, brother, and brother; Diabetes in his mother; Heart disease in his father; Hypertension in his father; Kidney Stones in his brother; Stroke in his brother and father.   ROS:   Please see the history of present illness.    Review of Systems  Constitution: Negative.    HENT: Negative.   Cardiovascular: Negative.   Respiratory: Positive for cough and snoring.   Endocrine: Negative.   Hematologic/Lymphatic: Negative.   Musculoskeletal: Positive for back pain and myalgias.  Gastrointestinal: Negative.   Genitourinary: Negative.   Neurological: Negative.    All other systems reviewed and are negative.   PHYSICAL EXAM:   VS:  BP 108/62   Pulse 77   Ht 5\' 8"  (1.727 m)   Wt 157 lb 3.2 oz (71.3 kg)   SpO2 96%   BMI 23.90 kg/m   Physical Exam  GEN: Well nourished, well developed, in no acute distress  Neck: no JVD, carotid bruits, or masses Cardiac:RRR; no murmurs, rubs, or gallops  Respiratory:  clear to auscultation bilaterally, normal work of breathing GI: soft, nontender, nondistended, + BS Ext: without cyanosis, clubbing, or edema, Good distal pulses bilaterally Neuro:  Alert and Oriented x 3 Psych: euthymic mood, full affect  Wt Readings from Last 3 Encounters:  11/08/18 157 lb 3.2 oz (71.3 kg)  10/21/18 161 lb 8 oz (73.3 kg)  09/05/18 156 lb 9.6 oz (71 kg)      Studies/Labs Reviewed:   EKG:  EKG is  ordered today.  The ekg ordered today demonstrates normal sinus rhythm without acute change  Recent Labs: 06/13/2018: ALT 17; Magnesium 2.0; TSH 0.840 06/14/2018: BUN 19; Creatinine, Ser 0.93; Hemoglobin 12.7; Platelets 180; Potassium 4.0; Sodium 142   Lipid Panel    Component Value Date/Time   CHOL 100 06/14/2018 0334   CHOL 96 (L) 09/13/2017 0930   TRIG 104 06/14/2018 0334   HDL 33 (L) 06/14/2018 0334   HDL 33 (L) 09/13/2017 0930   CHOLHDL 3.0 06/14/2018 0334   VLDL 21 06/14/2018 0334   LDLCALC 46 06/14/2018 0334   LDLCALC 45 09/13/2017 0930   LDLDIRECT 167.4 03/21/2009 1030    Additional studies/ records that were reviewed today include:  Catheterization 06/14/2018  Previously placed Prox LAD to Mid LAD stent (unknown type) is widely patent.  Mid RCA lesion is 40% stenosed.  Prox RCA lesion is 20% stenosed.  Post  Atrio lesion is 40% stenosed.  Previously placed Dist RCA stent (unknown type) is widely patent.  The left ventricular ejection fraction is greater than 65% by visual estimate.  The left ventricular systolic function is normal.  LV end diastolic pressure is normal.   Hyperdynamic LV function with an ejection fraction of 65 to 70% without focal segmental wall motion abnormalities.   Widely patent previously placed stents in the mid LAD after the first diagonal vessel and in the distal RCA.  The LAD tapers to a very small caliber vessel and ends just proximal to the apex.  The ramus intermediate, and left circumflex vessel are angiographically normal.  The RCA is a dominant vessel with mild luminal irregularity diffusely with 20% proximal stenosis, 40% mid stenosis.  The stent in the distal RCA after the acute margin and proximal to the PDA is widely patent.  There is a 40% continuation branch stenosis to the PLA vessel which actually is somewhat improved from the last catheterization.   RECOMMENDATION: Medical therapy.  With the patient's positive ECG changes on the stress test with suggestion of possible apical ischemia will further titrate beta-blocker therapy and increase Toprol-XL to 50 mg daily.   Recommend continuation of uninterrupted dual antiplatelet therapy with aspirin/Plavix.   Nuclear stress test 7/2019Study Highlights    Nuclear stress EF: 59%. Normal wall motion and left ventricular function.  Blood pressure demonstrated a normal response to exercise.  Horizontal ST segment depression ST segment depression of 2 mm was noted during stress in the III, aVF and II leads, beginning at 4 minutes of stress, and returning to baseline after 1-5 minutes of recovery.  Defect 1: There is a medium defect of moderate severity present in the apical anterior, apical septal and apex location.  Findings consistent with prior myocardial infarction with peri-infarct ischemia.  The left  ventricular ejection fraction is normal (55-65%).  This is an intermediate risk study.     Carotid Dopplers 10/11/2019Summary: Right Carotid: The extracranial vessels were near-normal with only minimal wall                thickening or plaque.  Left Carotid: There is no evidence of stenosis in the left ICA.  Vertebrals:  Bilateral vertebral arteries demonstrate antegrade flow. Subclavians: Normal flow hemodynamics were seen in bilateral subclavian              arteries.                Right subclavian diameter 1.31 cm x 1.32 cm.  *See table(s) above for measurements and observations.       ASSESSMENT:    1. Coronary artery disease due to lipid rich plaque   2. Preoperative clearance   3. Essential hypertension   4. Pure hypercholesterolemia      PLAN:  In order of problems listed above:  Preop clearance for undergoing lumbar spine injection at [facility] center.  Dr. Radford Pax stated he could hold his Plavix for 5 days prior to surgery.  He had 2 recent episodes of chest pain different from his angina.  1 related to eating spicy food and reflux the other was a muscle strain relieved with a heating pad.  He had a recent cardiac catheterization 05/2018 after an abnormal nuclear stress test And had widely patent stents  with nonobstructive disease elsewhere.  According to the revised cardiac risk index he does not need any further testing and METS is 1.25.  Further work-up prior to final injection.  Resume Plavix after spinal injection. follow-up with Dr. Radford Pax as previously scheduled.  According to the Revised Cardiac Risk Index (RCRI), his Perioperative Risk of Major Cardiac Event is (%): 0.9  His Functional Capacity in METs is: 7.25 according to the Duke Activity Status Index (DASI).    CAD status post stenting of mid LAD and distal RCA 07/2017, repeat cath 05/2018 for chest pain and abnormal nuclear stress test showed widely patent stents with 40% PDA, 40% mid RCA.   Toprol increased.  Essential hypertension controlled   Hypercholesterolemia on Crestor LDL 46 05/2018    Medication Adjustments/Labs and Tests Ordered: Current medicines are reviewed at length with the patient today.  Concerns regarding medicines are outlined above.  Medication changes, Labs and Tests ordered today are listed in the Patient Instructions below. There are no Patient Instructions on file for this visit.   Signed, Ermalinda Barrios, PA-C  11/08/2018 2:06 PM    [facility] [place], [place],   [address] Phone: [phone]; Fax: [phone]

## 2018-11-08 NOTE — Patient Instructions (Signed)
Medication Instructions:  Your physician recommends that you continue on your current medications as directed. Please refer to the Current Medication list given to you today.  If you need a refill on your cardiac medications before your next appointment, please call your pharmacy.   Lab work: None Ordered  If you have labs (blood work) drawn today and your tests are completely normal, you will receive your results only by: [name] MyChart Message (if you have MyChart) OR . A paper copy in the mail If you have any lab test that is abnormal or we need to change your treatment, we will call you to review the results.  Testing/Procedures: None ordered  Follow-Up: At [facility], you and your health needs are our priority.  As part of our continuing mission to provide you with exceptional heart care, we have created designated Provider Care Teams.  These Care Teams include your primary Cardiologist (physician) and Advanced Practice Providers (APPs -  Physician Assistants and Nurse Practitioners) who all work together to provide you with the care you need, when you need it. . You will need a follow up appointment in October 2020.  Please call our office 2 months in advance to schedule this appointment.  You may see Fransico Him, MD or one of the following Advanced Practice Providers on your designated Care Team:   . Lyda Jester, PA-C . Dayna Dunn, PA-C . Ermalinda Barrios, PA-C  Any Other Special Instructions Will Be Listed Below (If Applicable).

## 2018-11-11 ENCOUNTER — Telehealth: Payer: Self-pay | Admitting: Internal Medicine

## 2018-11-11 NOTE — Telephone Encounter (Signed)
I explained to the patient that we were unable to reach him after the barium swallow.  I reviewed the instructions for mano on Monday.  All questions answered.  He will call back for any additional questions or concerns.

## 2018-11-11 NOTE — Telephone Encounter (Signed)
Patient states that he is confused about appt scheduled for Monday (esophageal manometry) states that he did not know anything about appt has no information so he wants to speak to nurse and he wants to cx

## 2018-11-14 ENCOUNTER — Encounter (HOSPITAL_COMMUNITY)
Admission: RE | Disposition: A | Discharge status: Home / Self Care | Payer: Self-pay | Source: Home / Self Care | Attending: Internal Medicine

## 2018-11-14 ENCOUNTER — Ambulatory Visit (HOSPITAL_COMMUNITY)
Admission: RE | Admit: 2018-11-14 | Discharge: 2018-11-14 | Disposition: A | Discharge status: Home / Self Care | Payer: Federal, State, Local not specified - PPO | Attending: Internal Medicine | Admitting: Internal Medicine

## 2018-11-14 DIAGNOSIS — R1110 Vomiting, unspecified: Secondary | ICD-10-CM | POA: Diagnosis not present

## 2018-11-14 DIAGNOSIS — R12 Heartburn: Secondary | ICD-10-CM | POA: Insufficient documentation

## 2018-11-14 DIAGNOSIS — R1310 Dysphagia, unspecified: Secondary | ICD-10-CM | POA: Diagnosis not present

## 2018-11-14 DIAGNOSIS — K222 Esophageal obstruction: Secondary | ICD-10-CM | POA: Diagnosis not present

## 2018-11-14 HISTORY — PX: ESOPHAGEAL MANOMETRY: SHX5429

## 2018-11-14 SURGERY — MANOMETRY, ESOPHAGUS
Anesthesia: Topical

## 2018-11-14 MED ORDER — LIDOCAINE VISCOUS HCL 2 % MT SOLN
OROMUCOSAL | Status: AC
  Filled 2018-11-14: qty 15

## 2018-11-14 SURGICAL SUPPLY — 2 items
FACESHIELD LNG OPTICON STERILE (SAFETY) IMPLANT
GLOVE BIO SURGEON STRL SZ8 (GLOVE) ×4 IMPLANT

## 2018-11-14 NOTE — Progress Notes (Signed)
Esophageal manometry performed per protocol.  Patient tolerated procedure without complications.  Dr. Nandigam to interpret results. 

## 2018-11-16 ENCOUNTER — Encounter (HOSPITAL_COMMUNITY): Payer: Self-pay | Admitting: Internal Medicine

## 2018-11-22 ENCOUNTER — Other Ambulatory Visit: Payer: Federal, State, Local not specified - PPO

## 2018-11-28 ENCOUNTER — Ambulatory Visit
Admission: RE | Admit: 2018-11-28 | Discharge: 2018-11-28 | Disposition: A | Discharge status: Home / Self Care | Payer: Federal, State, Local not specified - PPO | Source: Ambulatory Visit | Attending: Internal Medicine | Admitting: Internal Medicine

## 2018-11-28 DIAGNOSIS — M5136 Other intervertebral disc degeneration, lumbar region: Secondary | ICD-10-CM

## 2018-11-28 MED ORDER — IOPAMIDOL (ISOVUE-M 200) INJECTION 41%: 1.0000 mL | Freq: Once | INTRAMUSCULAR | Status: DC

## 2018-11-28 MED ORDER — METHYLPREDNISOLONE ACETATE 40 MG/ML INJ SUSP (RADIOLOG: 120.0000 mg | Freq: Once | INTRAMUSCULAR | Status: DC

## 2018-11-28 NOTE — Discharge Instructions (Signed)

## 2018-12-01 NOTE — Progress Notes (Signed)
The manometry suggests there is a problem with the junction of the esophagus and stomach - probably muscle not relaxing properly but could be something else like something scarring or growing abnormally (doubt)  I recommend he have an EGD with possible dilation  Will need to check with cardiology about holding clopidogrel x 5 days which needs to be done  He has benefited from dilation in past

## 2018-12-05 ENCOUNTER — Telehealth: Payer: Self-pay

## 2018-12-05 NOTE — Telephone Encounter (Signed)
[facility] Pre-operative Risk Assessment     Request for surgical clearance:     Endoscopy Procedure  What type of surgery is being performed?     Upper endoscopy with dilation  When is this surgery scheduled?     01/02/19  What type of clearance is required ?   Pharmacy  Are there any medications that need to be held prior to surgery and how long? Plavix- 5 day hold  Practice name and name of physician performing surgery?      [facility]  What is your office phone and fax number?      Phone- [phone]  [phone]  Anesthesia type (None, local, MAC, general) ?       MAC

## 2018-12-06 NOTE — Telephone Encounter (Signed)
   Primary Cardiologist: Fransico Him, MD  Chart reviewed as part of pre-operative protocol coverage.   See OV note from 11/08/18 (Cardiology).  Patient cleared to hold Plavix and proceed with spinal injection at office visit in 10/2018.  Therefore, it is ok for him to hold Plavix for 5 days for EGD and resume post op when safe.  Recommend continuing ASA without interruption if possible.  Call with questions.    Richardson Dopp, PA-C 12/06/2018, 3:25 PM

## 2018-12-07 DIAGNOSIS — I10 Essential (primary) hypertension: Secondary | ICD-10-CM | POA: Diagnosis not present

## 2018-12-07 DIAGNOSIS — Z125 Encounter for screening for malignant neoplasm of prostate: Secondary | ICD-10-CM | POA: Diagnosis not present

## 2018-12-07 DIAGNOSIS — Z0000 Encounter for general adult medical examination without abnormal findings: Secondary | ICD-10-CM | POA: Diagnosis not present

## 2018-12-07 DIAGNOSIS — E559 Vitamin D deficiency, unspecified: Secondary | ICD-10-CM | POA: Diagnosis not present

## 2018-12-07 DIAGNOSIS — R82998 Other abnormal findings in urine: Secondary | ICD-10-CM | POA: Diagnosis not present

## 2018-12-07 DIAGNOSIS — E119 Type 2 diabetes mellitus without complications: Secondary | ICD-10-CM | POA: Diagnosis not present

## 2018-12-13 DIAGNOSIS — Z1212 Encounter for screening for malignant neoplasm of rectum: Secondary | ICD-10-CM | POA: Diagnosis not present

## 2018-12-16 DIAGNOSIS — E559 Vitamin D deficiency, unspecified: Secondary | ICD-10-CM | POA: Diagnosis not present

## 2018-12-16 DIAGNOSIS — I10 Essential (primary) hypertension: Secondary | ICD-10-CM | POA: Diagnosis not present

## 2018-12-16 DIAGNOSIS — Z0000 Encounter for general adult medical examination without abnormal findings: Secondary | ICD-10-CM | POA: Diagnosis not present

## 2018-12-16 DIAGNOSIS — Z1389 Encounter for screening for other disorder: Secondary | ICD-10-CM | POA: Diagnosis not present

## 2018-12-16 DIAGNOSIS — R1319 Other dysphagia: Secondary | ICD-10-CM | POA: Diagnosis not present

## 2018-12-16 DIAGNOSIS — M859 Disorder of bone density and structure, unspecified: Secondary | ICD-10-CM | POA: Diagnosis not present

## 2018-12-26 ENCOUNTER — Ambulatory Visit (AMBULATORY_SURGERY_CENTER): Payer: Self-pay

## 2018-12-26 ENCOUNTER — Encounter: Payer: Self-pay | Admitting: Internal Medicine

## 2018-12-26 VITALS — Ht 68.0 in | Wt 163.2 lb

## 2018-12-26 DIAGNOSIS — R1310 Dysphagia, unspecified: Secondary | ICD-10-CM

## 2018-12-26 NOTE — Progress Notes (Signed)
Per pt, he will be getting blood work done by Dr Philip Aspen and he will have the results faxed to our office prior to the endoscopy.  Per pt, no allergies to soy or egg products.Pt not taking any weight loss meds or using  O2 at home.  Pt refused emmi video.

## 2018-12-30 IMAGING — CR DG CHEST 2V
2 series · 2 of 2 positions shown · non-contrast
Comparison: Chest x-ray dated 05/18/2016.

CLINICAL DATA: Left-sided chest pain.

EXAM:
CHEST  2 VIEW

[w chest pa]
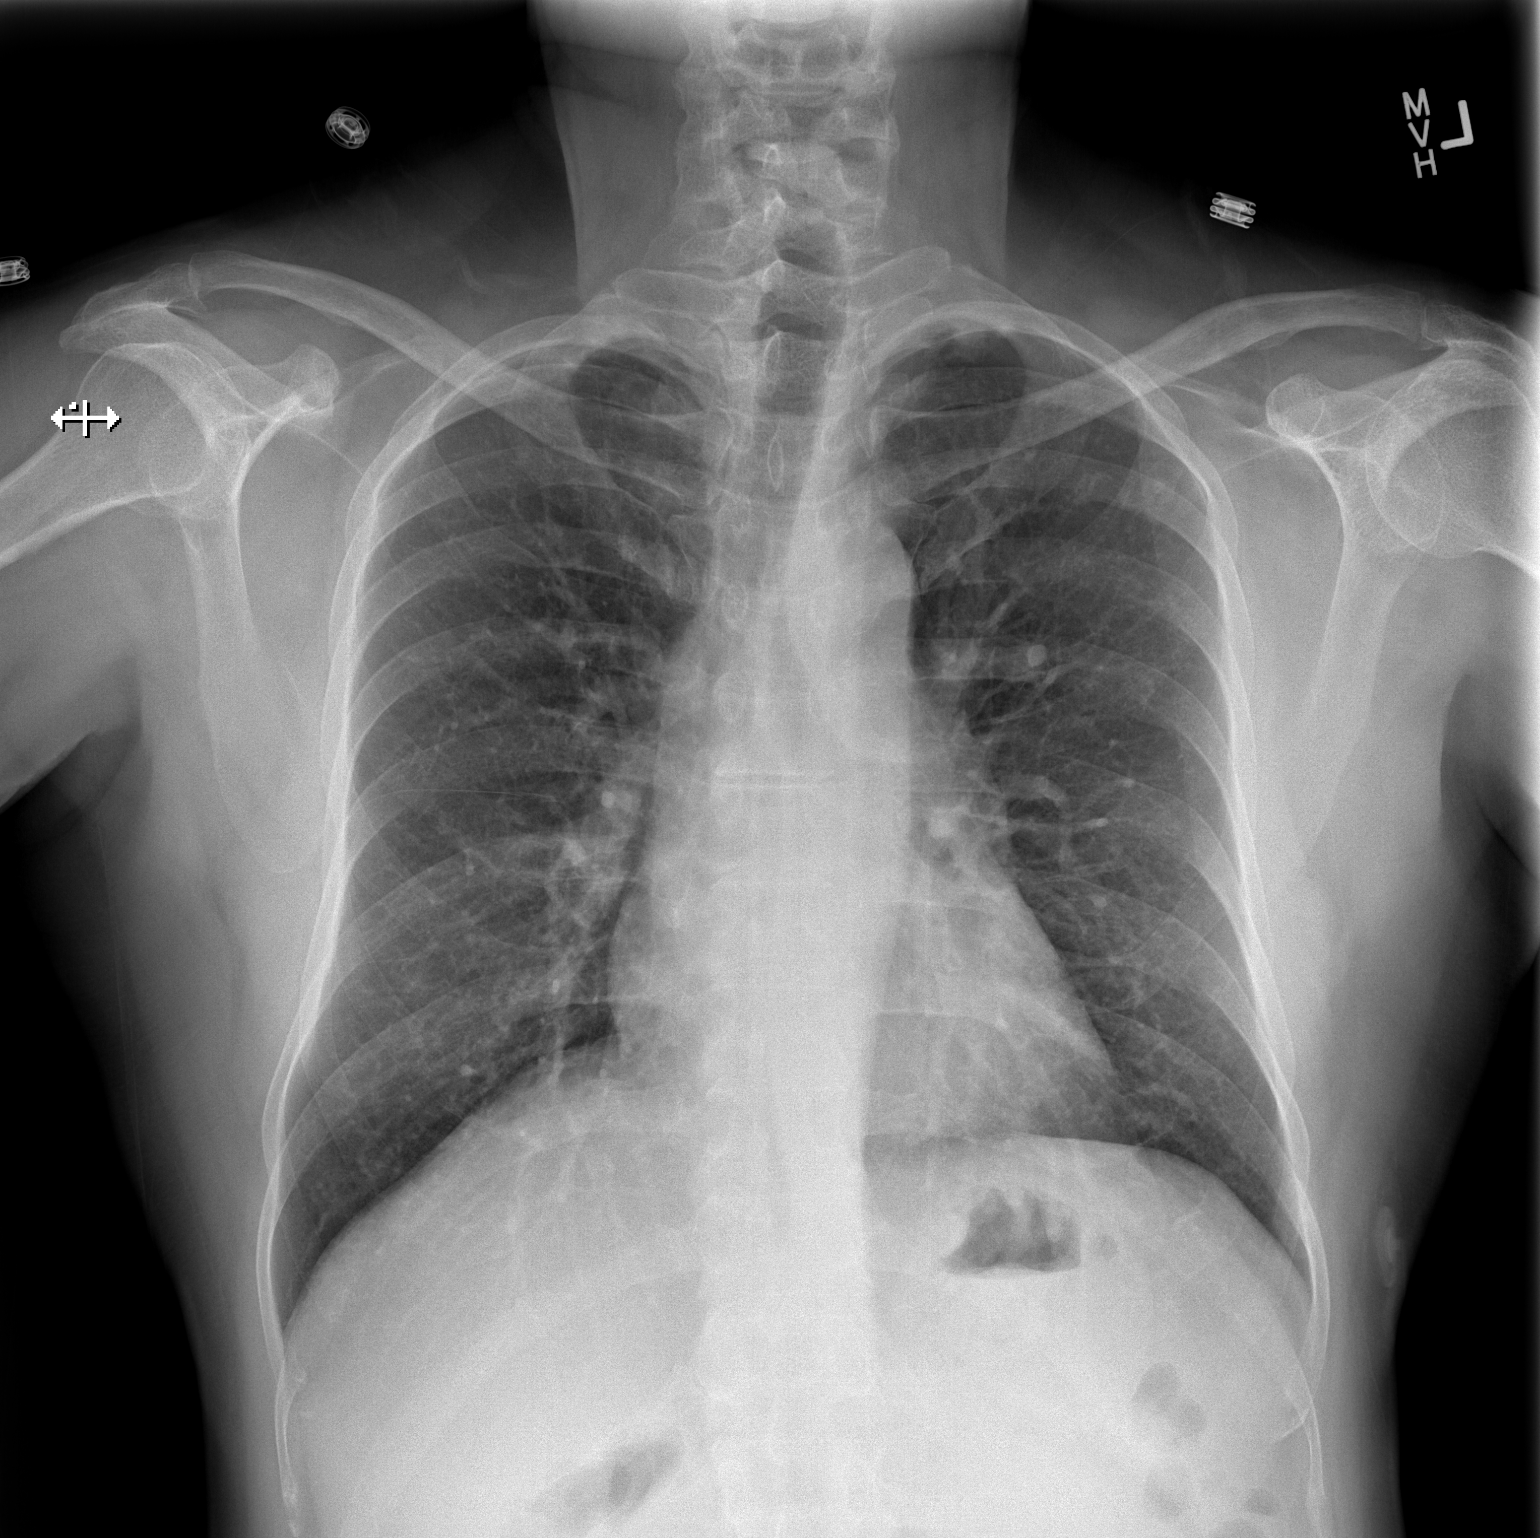

[w chest lat]
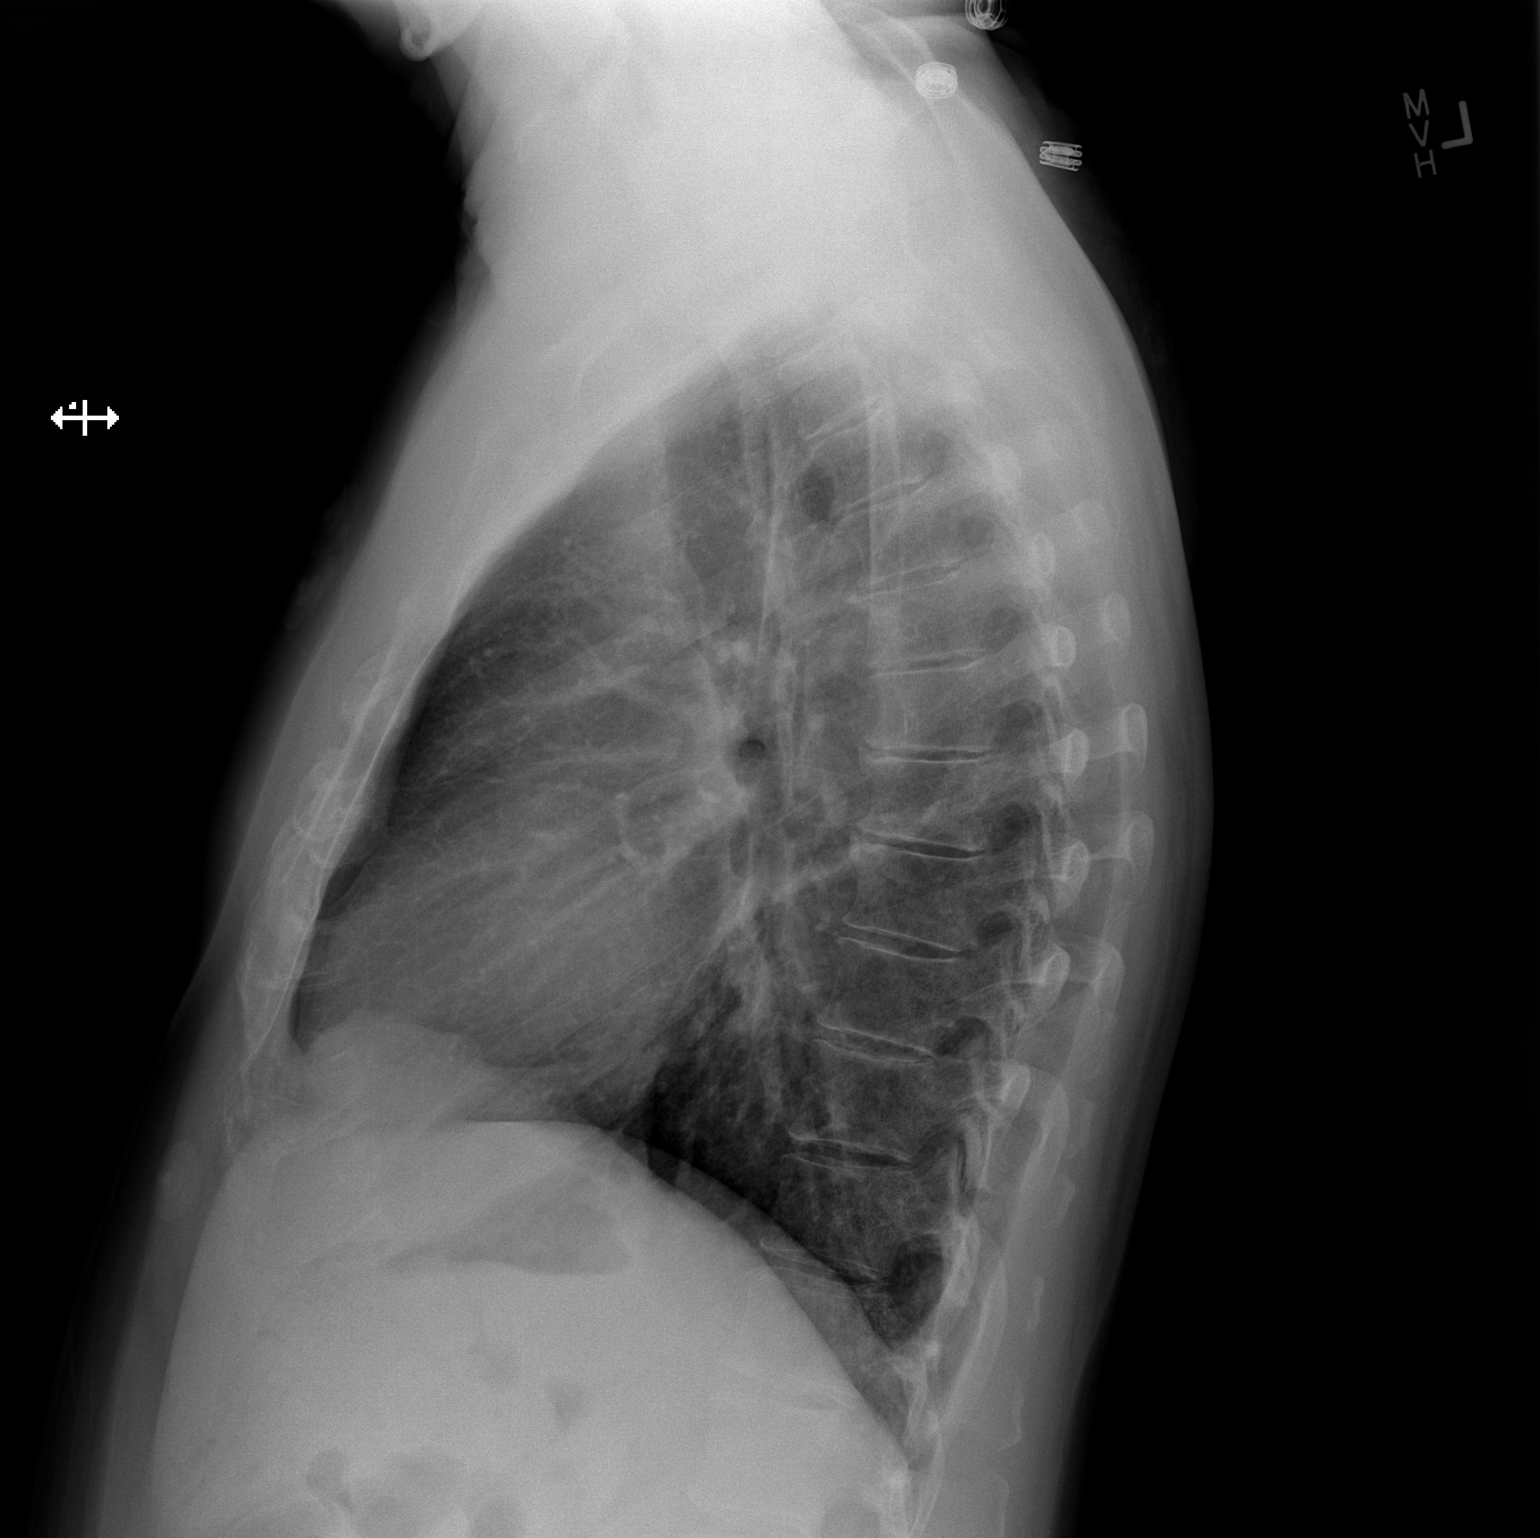

[2 of 2 positions shown; findings below may reference images not displayed]

FINDINGS: Heart size and mediastinal contours are normal. Lungs are clear. No
pleural effusion or pneumothorax seen. Osseous structures about the
chest are unremarkable.
IMPRESSION: No active cardiopulmonary disease. No evidence of pneumonia or
pulmonary edema.

## 2019-01-02 ENCOUNTER — Encounter: Payer: Self-pay | Admitting: Internal Medicine

## 2019-01-02 ENCOUNTER — Ambulatory Visit (AMBULATORY_SURGERY_CENTER): Payer: Federal, State, Local not specified - PPO | Admitting: Internal Medicine

## 2019-01-02 VITALS — BP 101/62 | HR 75 | Temp 96.9°F | Resp 15 | Ht 68.0 in | Wt 161.0 lb

## 2019-01-02 DIAGNOSIS — K2970 Gastritis, unspecified, without bleeding: Secondary | ICD-10-CM

## 2019-01-02 DIAGNOSIS — K3189 Other diseases of stomach and duodenum: Secondary | ICD-10-CM | POA: Diagnosis not present

## 2019-01-02 DIAGNOSIS — R1310 Dysphagia, unspecified: Secondary | ICD-10-CM

## 2019-01-02 DIAGNOSIS — R933 Abnormal findings on diagnostic imaging of other parts of digestive tract: Secondary | ICD-10-CM | POA: Diagnosis not present

## 2019-01-02 MED ORDER — SODIUM CHLORIDE 0.9 % IV SOLN: 500.0000 mL | Freq: Once | INTRAVENOUS | Status: DC

## 2019-01-02 NOTE — Op Note (Signed)
[place] Patient Name: Donald Jennings Procedure Date: 01/02/2019 10:00 AM MRN: [id number] Endoscopist: Gatha Mayer , MD Age: 63 Referring MD:  Date of Birth: 1956-08-31 Gender: Male Account #: [id number] Procedure:                Upper GI endoscopy Indications:              Dysphagia Medicines:                Propofol per Anesthesia, Monitored Anesthesia Care Procedure:                Pre-Anesthesia Assessment:                           - Prior to the procedure, a History and Physical                            was performed, and patient medications and                            allergies were reviewed. [name] tolerance of                            previous anesthesia was also reviewed. The risks                            and benefits of the procedure and the sedation                            options and risks were discussed with the patient.                            All questions were answered, and informed consent                            was obtained. Prior Anticoagulants: The patient                            last took aspirin 1 day and Plavix (clopidogrel) 5                            days prior to the procedure. ASA Grade Assessment:                            III - A patient with severe systemic disease. After                            reviewing the risks and benefits, the patient was                            deemed in satisfactory condition to undergo the                            procedure.  After obtaining informed consent, the endoscope was                            passed under direct vision. Throughout the                            procedure, the patient's blood pressure, pulse, and                            oxygen saturations were monitored continuously. The                            Endoscope was introduced through the mouth, and                            advanced to the second part of duodenum. The upper                         GI endoscopy was accomplished without difficulty.                            The patient tolerated the procedure well. Scope In: Scope Out: Findings:                 The examined esophagus was mildly tortuous.                           Patchy mildly erythematous mucosa without bleeding                            was found in the gastric antrum.                           Patchy mildly erythematous mucosa without active                            bleeding and with no stigmata of bleeding was found                            in the entire duodenum.                           The exam was otherwise without abnormality.                           The cardia and gastric fundus were normal on                            retroflexion.                           The scope was withdrawn. Dilation was performed in                            the entire esophagus with a Maloney dilator with  mild resistance at 54 Fr. Estimated blood loss:                            none. Complications:            No immediate complications. Estimated Blood Loss:     Estimated blood loss: none. Impression:               - Tortuous esophagus.                           - Erythematous mucosa in the antrum.                           - Erythematous duodenopathy.                           - The examination was otherwise normal.                           - Dilation performed in the entire esophagus.                           - No specimens collected. Recommendation:           - Patient has a contact number available for                            emergencies. The signs and symptoms of potential                            delayed complications were discussed with the                            patient. Return to normal activities tomorrow.                            Written discharge instructions were provided to the                            patient.                           -  Clear liquids x 1 hour then soft foods rest of                            day. Start prior diet tomorrow.                           - Continue present medications. Including PPI                           - Resume aspirin today and Plavix (clopidogrel)                            today at prior doses.                           -  Hopefully the dilation will help as it did in                            past.                           If not - diet modification seems best bet though we                            could explore medication treatment for his                            manometric identified EG junction outflow                            obstruction also. He can f/u prn Gatha Mayer, MD 01/02/2019 10:27:42 AM This report has been signed electronically.

## 2019-01-02 NOTE — Addendum Note (Signed)
Addended by: Dossie Arbour on: 01/02/2019 02:42 PM   Modules accepted: Orders

## 2019-01-02 NOTE — Patient Instructions (Addendum)
I dilated the esophagus as I did in the past. Hopefully that will make the swallowing better. If you do not get relief after 1-2 months we can see if there is other treatment.  I appreciate the opportunity to care for you. Gatha Mayer, MD, [facility]  Dilation diet handout given to patient.  Continue present medications, including PPI.  Resume Aspirin today and Plavix (clopidogrel) today at prior doses.  Follow-up as needed.  YOU HAD AN ENDOSCOPIC PROCEDURE TODAY AT [facility]:   Refer to the procedure report that was given to you for any specific questions about what was found during the examination.  If the procedure report does not answer your questions, please call your gastroenterologist to clarify.  If you requested that your care partner not be given the details of your procedure findings, then the procedure report has been included in a sealed envelope for you to review at your convenience later.  YOU SHOULD EXPECT: Some feelings of bloating in the abdomen. Passage of more gas than usual.  Walking can help get rid of the air that was put into your GI tract during the procedure and reduce the bloating. If you had a lower endoscopy (such as a colonoscopy or flexible sigmoidoscopy) you may notice spotting of blood in your stool or on the toilet paper. If you underwent a bowel prep for your procedure, you may not have a normal bowel movement for a few days.  Please Note:  You might notice some irritation and congestion in your nose or some drainage.  This is from the oxygen used during your procedure.  There is no need for concern and it should clear up in a day or so.  SYMPTOMS TO REPORT IMMEDIATELY:  Following upper endoscopy (EGD)  Vomiting of blood or coffee ground material  New chest pain or pain under the shoulder blades  Painful or persistently difficult swallowing  New shortness of breath  Fever of 100F or higher  Black, tarry-looking stools  For  urgent or emergent issues, a gastroenterologist can be reached at any hour by calling [phone].   DIET:  We do recommend a small meal at first, but then you may proceed to your regular diet.  Drink plenty of fluids but you should avoid alcoholic beverages for 24 hours.  ACTIVITY:  You should plan to take it easy for the rest of today and you should NOT DRIVE or use heavy machinery until tomorrow (because of the sedation medicines used during the test).    FOLLOW UP: Our staff will call the number listed on your records the next business day following your procedure to check on you and address any questions or concerns that you may have regarding the information given to you following your procedure. If we do not reach you, we will leave a message.  However, if you are feeling well and you are not experiencing any problems, there is no need to return our call.  We will assume that you have returned to your regular daily activities without incident.  If any biopsies were taken you will be contacted by phone or by letter within the next 1-3 weeks.  Please call us at [phone] if you have not heard about the biopsies in 3 weeks.    SIGNATURES/CONFIDENTIALITY: You and/or your care partner have signed paperwork which will be entered into your electronic medical record.  These signatures attest to the fact that that the information above on your  After Visit Summary has been reviewed and is understood.  Full responsibility of the confidentiality of this discharge information lies with you and/or your care-partner.

## 2019-01-02 NOTE — Progress Notes (Signed)
To PACU VSS. Report to [email or web address] 

## 2019-01-02 NOTE — Progress Notes (Signed)
Called to room to assist during endoscopic procedure.  Patient ID and intended procedure confirmed with present staff. Received instructions for my participation in the procedure from the performing physician.  

## 2019-01-03 ENCOUNTER — Telehealth: Payer: Self-pay | Admitting: Cardiology

## 2019-01-03 ENCOUNTER — Telehealth: Payer: Self-pay

## 2019-01-03 NOTE — Telephone Encounter (Signed)
  Follow up Call-  Call back number 01/02/2019 08/31/2016  Post procedure Call Back phone  # [phone] cell ok to speak with wife.  Permission to leave phone message Yes Yes  Some recent data might be hidden     Patient questions:  Do you have a fever, pain , or abdominal swelling? No. Pain Score  0 *  Have you tolerated food without any problems? Yes.    Have you been able to return to your normal activities? Yes.    Do you have any questions about your discharge instructions: Diet   No. Medications  No. Follow up visit  No.  Do you have questions or concerns about your Care? No.  Actions: * If pain score is 4 or above: No action needed, pain <4.

## 2019-01-03 NOTE — Telephone Encounter (Signed)
Spoke with the patient's wife per DPR, she stated that the insurance company would only cover 90 days out of a calender year. They would need documentation from Dr. Radford Pax stating the patient has tried other medications and they had failed. The wife said he took omeprazole in the past but Dr. Radford Pax stopped due to it interacting with other cardiac medications. Spoke with Jeani Hawking, she would need Dr. Radford Pax to document necessity before she could apply.

## 2019-01-03 NOTE — Telephone Encounter (Signed)
I would like him to see his PCP for any further treatment of GERD

## 2019-01-03 NOTE — Telephone Encounter (Signed)
°  Pt c/o medication issue:  1. Name of Medication: pantoprazole (PROTONIX) 40 MG tablet  2. How are you currently taking this medication (dosage and times per day)? 1x daily  3. Are you having a reaction (difficulty breathing--STAT)? n/a  4. What is your medication issue? Insurance company Needs authorization from doctor for pt to get medication  Insurance company #: [phone] [employer]

## 2019-01-04 ENCOUNTER — Telehealth: Payer: Self-pay | Admitting: Internal Medicine

## 2019-01-04 MED ORDER — PANTOPRAZOLE SODIUM 40 MG PO TBEC: 40.0000 mg | DELAYED_RELEASE_TABLET | Freq: Every day | ORAL | 3 refills | Status: DC

## 2019-01-04 NOTE — Telephone Encounter (Signed)
LMTCB

## 2019-01-04 NOTE — Telephone Encounter (Signed)
I spoke with the insurance rep and did the prior authorization over the phone for the pantoprazole 40mg  tablets, one daily. Dx : GERD K21.9. It has been approved thru 01/04/2020. Left patient a message that it has been approved for a year. Will send in to pharmacy to fill.

## 2019-01-04 NOTE — Telephone Encounter (Signed)
Pts wife notified to contact his PMD Dr. Philip Aspen for future GI med refills.. pt is now seeing Dr. Carlean Purl GI MD and had an EGD this past Monday so she is going to contact him.

## 2019-01-05 NOTE — Telephone Encounter (Signed)
I also called and left a voice mail message about the approval on his wife's # .

## 2019-01-05 NOTE — Telephone Encounter (Signed)
We can refill his ppi for a year

## 2019-01-05 NOTE — Telephone Encounter (Signed)
I did the prior authorization on this yesterday and it's been approved for a year. I called and got his voice mail again today to make sure he was aware of this information.

## 2019-01-14 ENCOUNTER — Other Ambulatory Visit (HOSPITAL_COMMUNITY): Payer: Self-pay | Admitting: Cardiology

## 2019-01-14 DIAGNOSIS — I10 Essential (primary) hypertension: Secondary | ICD-10-CM

## 2019-05-16 ENCOUNTER — Other Ambulatory Visit: Payer: Self-pay | Admitting: Cardiology

## 2019-05-16 DIAGNOSIS — I10 Essential (primary) hypertension: Secondary | ICD-10-CM

## 2019-05-16 DIAGNOSIS — E118 Type 2 diabetes mellitus with unspecified complications: Secondary | ICD-10-CM

## 2019-05-18 ENCOUNTER — Other Ambulatory Visit: Payer: Self-pay | Admitting: Cardiology

## 2019-05-18 DIAGNOSIS — I10 Essential (primary) hypertension: Secondary | ICD-10-CM

## 2019-05-18 DIAGNOSIS — E118 Type 2 diabetes mellitus with unspecified complications: Secondary | ICD-10-CM

## 2019-06-17 DIAGNOSIS — Z20828 Contact with and (suspected) exposure to other viral communicable diseases: Secondary | ICD-10-CM | POA: Diagnosis not present

## 2019-06-19 DIAGNOSIS — E119 Type 2 diabetes mellitus without complications: Secondary | ICD-10-CM | POA: Diagnosis not present

## 2019-06-19 DIAGNOSIS — I10 Essential (primary) hypertension: Secondary | ICD-10-CM | POA: Diagnosis not present

## 2019-06-19 DIAGNOSIS — F17210 Nicotine dependence, cigarettes, uncomplicated: Secondary | ICD-10-CM | POA: Diagnosis not present

## 2019-06-19 DIAGNOSIS — Z1331 Encounter for screening for depression: Secondary | ICD-10-CM | POA: Diagnosis not present

## 2019-06-19 DIAGNOSIS — Z9861 Coronary angioplasty status: Secondary | ICD-10-CM | POA: Diagnosis not present

## 2019-06-30 IMAGING — MR MR THORACIC SPINE WO/W CM
4 of 9 series · 18 of 48 positions shown · IV contrast (Yes)
Comparison: PET-CT 04/06/2017.  Thoracic spine MRI 02/18/2017

CLINICAL DATA: 61-year-old male with indeterminate T5, T6, and T8
vertebral body lesions.

PET-CT earlier this year suggested benign/degenerative rather than
malignant etiology.
Status post fluoroscopic guided bone biopsy in [place] reported as
benign appearing mild plasmacytoma cyst on histology
Subsequent encounter.
EXAM:
MRI THORACIC WITHOUT AND WITH CONTRAST
TECHNIQUE: Multiplanar and multiecho pulse sequences of the thoracic spine were
obtained without and with intravenous contrast.
CONTRAST:  14mL MULTIHANCE GADOBENATE DIMEGLUMINE 529 MG/ML IV SOLN

[Series 5: T1 · sagittal · 3.0mm · 0.66mm/px · 2 of 13 slices shown (1 of 2)]
[im 1/13]
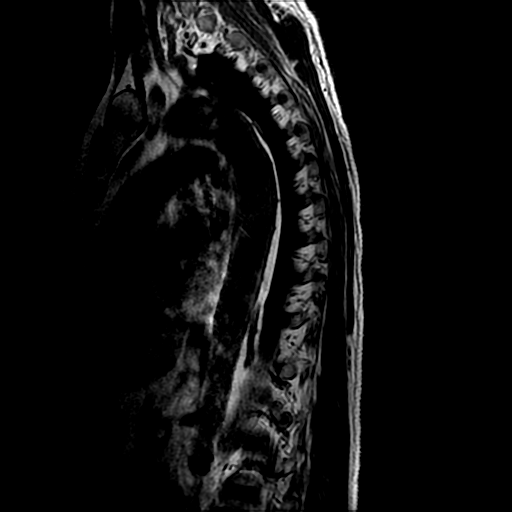
[im 13/13]
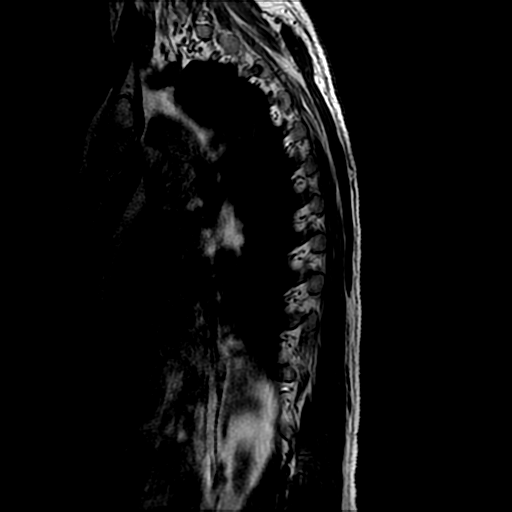

[Series 7: T2 · axial · 4.0mm · 0.39mm/px · z∈[-355,-115]mm · 9 of 41 slices shown]
[im 1/41]
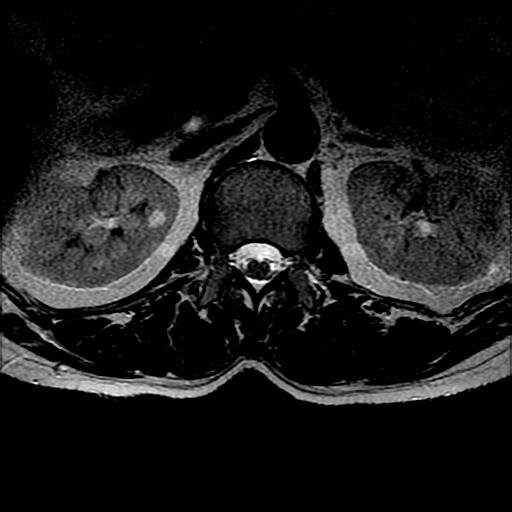
[im 6/41]
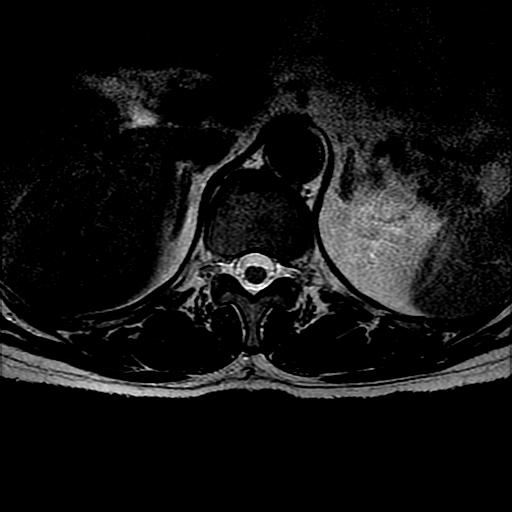
[im 11/41]
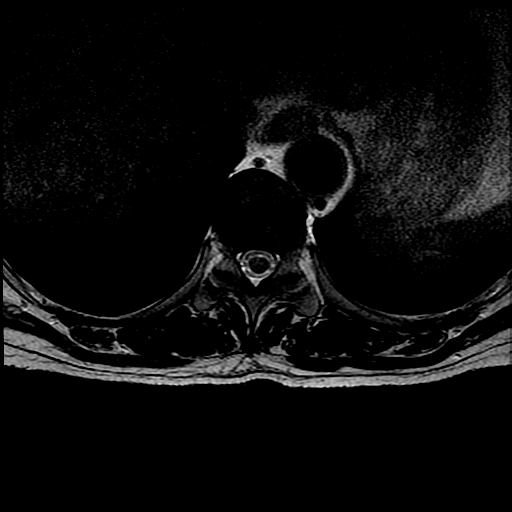
[im 16/41]
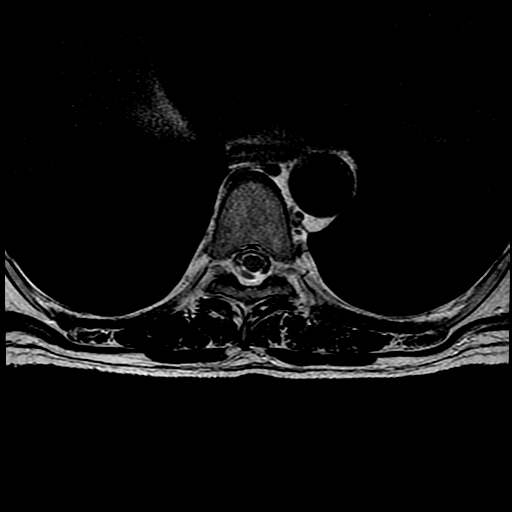
[im 21/41]
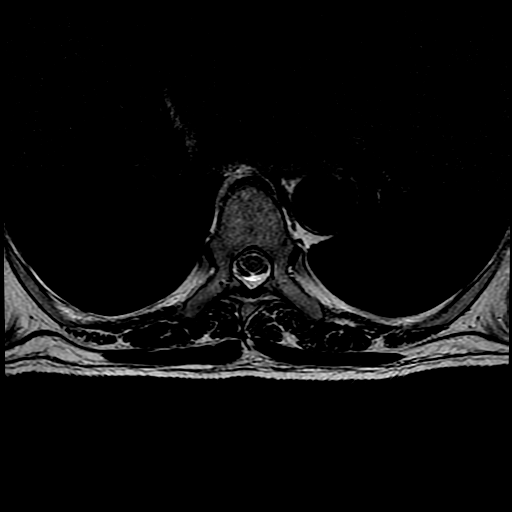
[im 26/41]
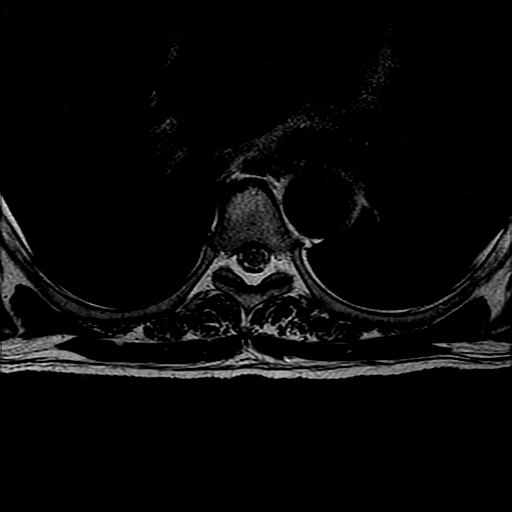
[im 31/41]
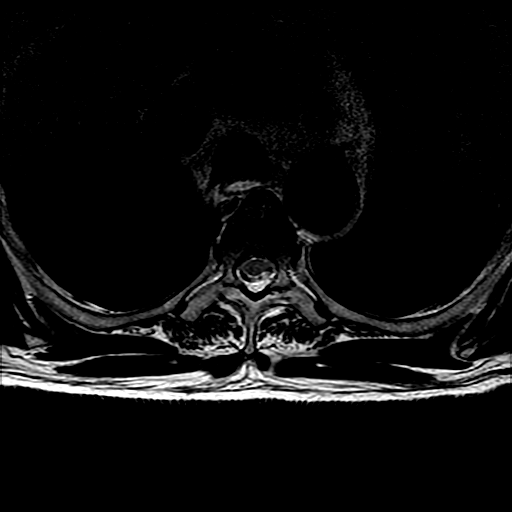
[im 36/41]
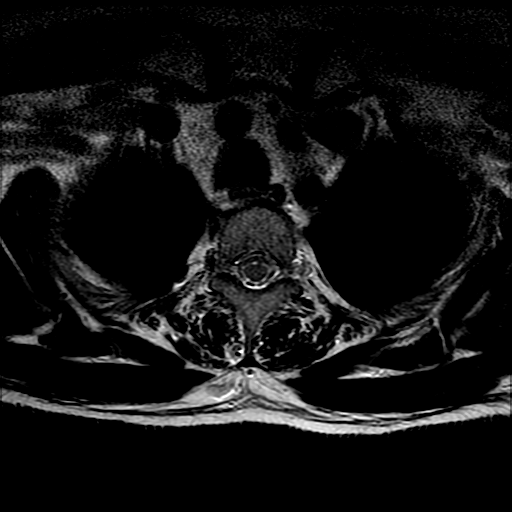
[im 41/41]
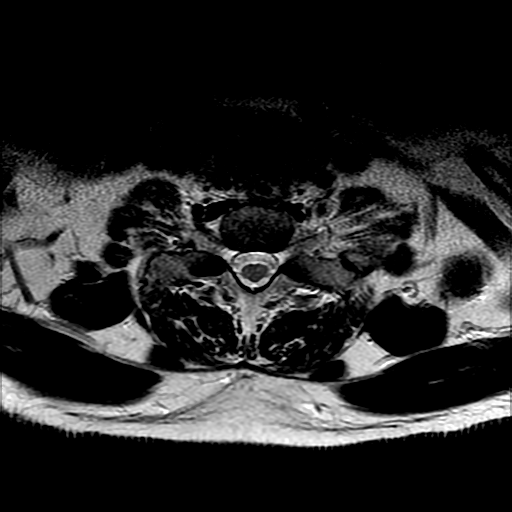

[Series 9: T1 · axial · non-contrast · 4.0mm · 0.39mm/px · z∈[-355,-145]mm · 4 of 41 slices shown (2 of 2)]
[im 1/41]
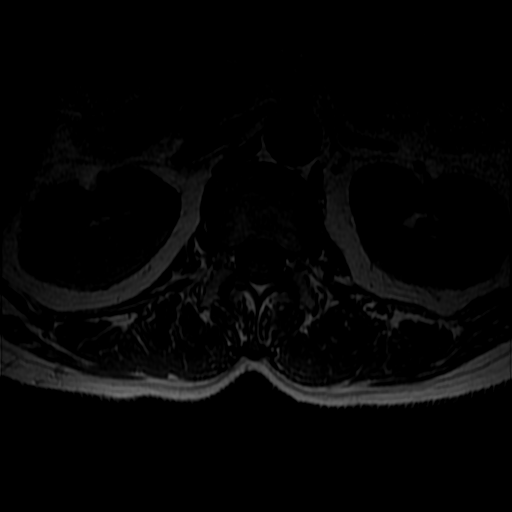
[im 6/41]
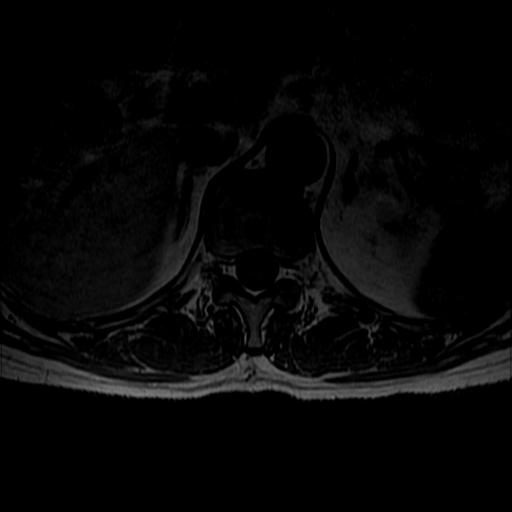
[im 21/41]
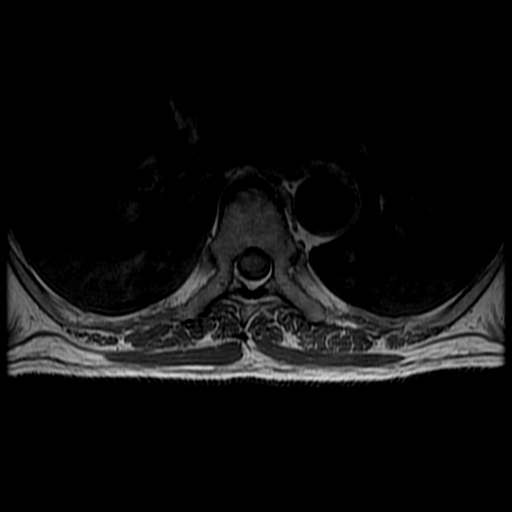
[im 36/41]
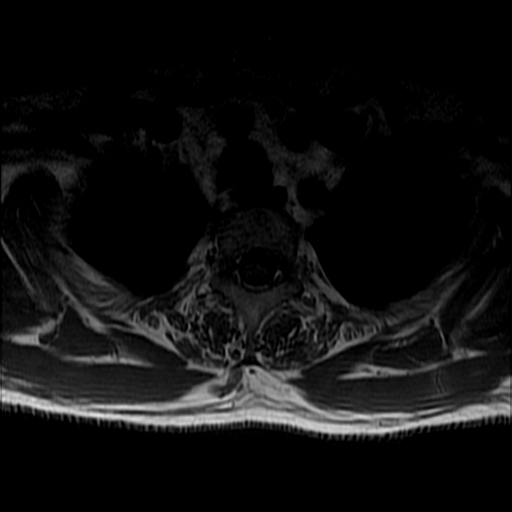

[Series 10: T2 post-contrast · sagittal · 3.0mm · 0.66mm/px · 3 of 13 slices shown]
[im 1/13]
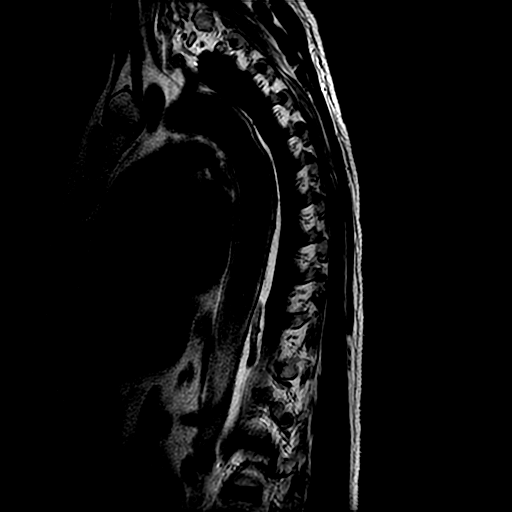
[im 7/13]
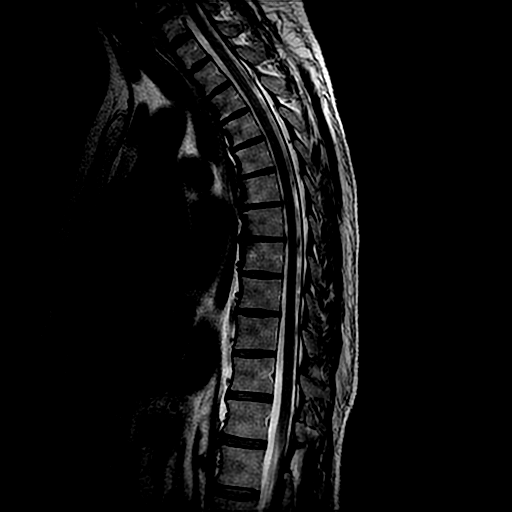
[im 13/13]
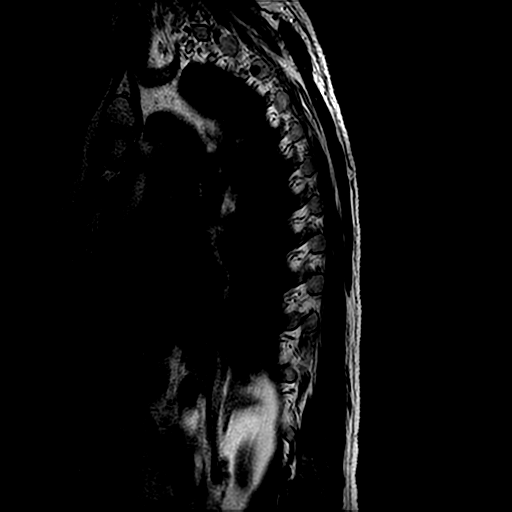

[18 of 48 positions shown; findings below may reference images not displayed]

FINDINGS: Limited cervical spine imaging: Negative, aside from straightening
of cervical lordosis.

Thoracic spine segmentation:  Appears normal.

Alignment: Stable an normal thoracic vertebral height and alignment.

Vertebrae: The abnormal anterior marrow edema previously seen at the
T5, T6, and T8 levels has largely resolved.

Mild residual marrow edema in T8, now eccentric to the right, along
with new mild anterior inferior T7 endplate marrow edema appears
degenerative in nature. There is associated anterior endplate
osteophytosis at that level, and mild degenerative appearing
enhancement of the anterior disc space.

Underlying bone marrow signal elsewhere in the visible spine remains
normal. The visible ribs appear normal. No other new marrow signal
abnormality.

Cord: Spinal cord signal is within normal limits at all visualized
levels. No abnormal intradural enhancement. No dural thickening or
enhancement. The conus medullaris appears normal at L1

Paraspinal and other soft tissues: Negative visualized thoracic and
upper abdominal viscera. Several small benign right renal cysts are
visible. Negative visualized posterior paraspinal soft tissues.

Disc levels:

Mild for age thoracic spine degeneration overall.

There is mild thoracic dorsal epidural lipomatosis, maximal at T5
and T6. Notable degenerative findings include:

T4-T5:  Mild facet and ligament flavum hypertrophy.  No stenosis.

T6-T7: Mild circumferential disc bulge and ligament flavum
hypertrophy. No stenosis.

T7-T8: Mild circumferential disc bulge with broad-based posterior
component. Mild facet hypertrophy, ligament flavum hypertrophy, and
epidural lipomatosis. No significant stenosis.

T8-T9:  Mild facet and ligament flavum hypertrophy.  No stenosis.

T9-T10: Minimal circumferential disc bulge. Mild facet and ligament
flavum hypertrophy. Borderline to mild T9 neural foraminal stenosis.

T10-T11: Mild facet hypertrophy. Borderline to mild T10 neural
foraminal stenosis.
IMPRESSION: 1. Resolved vertebral marrow edema at T5 and T[DATE].
Regressed marrow edema at the T8 level, the residual of which has a
typical appearance for degenerative endplate edema, and is
accompanied by mild T7 endplate edema. No new or suspicious marrow
lesion.
2. Mild for age thoracic spine degeneration. Mild thoracic epidural
lipomatosis. No significant spinal stenosis. Up to mild T9 and T10
neural foraminal stenosis.

## 2019-07-13 ENCOUNTER — Telehealth: Payer: Self-pay

## 2019-07-13 NOTE — Telephone Encounter (Signed)
Attempted to call pt to schedule follow up appt, no answer

## 2019-07-20 ENCOUNTER — Other Ambulatory Visit: Payer: Self-pay | Admitting: Cardiology

## 2019-09-04 ENCOUNTER — Other Ambulatory Visit: Payer: Self-pay

## 2019-09-04 ENCOUNTER — Ambulatory Visit (INDEPENDENT_AMBULATORY_CARE_PROVIDER_SITE_OTHER)
Admission: RE | Admit: 2019-09-04 | Discharge: 2019-09-04 | Disposition: A | Discharge status: Home / Self Care | Payer: Federal, State, Local not specified - PPO | Source: Ambulatory Visit | Attending: Acute Care | Admitting: Acute Care

## 2019-09-04 DIAGNOSIS — F17200 Nicotine dependence, unspecified, uncomplicated: Secondary | ICD-10-CM

## 2019-09-04 DIAGNOSIS — Z122 Encounter for screening for malignant neoplasm of respiratory organs: Secondary | ICD-10-CM

## 2019-09-04 DIAGNOSIS — F17210 Nicotine dependence, cigarettes, uncomplicated: Secondary | ICD-10-CM

## 2019-09-07 ENCOUNTER — Ambulatory Visit: Payer: Federal, State, Local not specified - PPO

## 2019-09-11 ENCOUNTER — Other Ambulatory Visit: Payer: Self-pay | Admitting: *Deleted

## 2019-09-11 DIAGNOSIS — F17210 Nicotine dependence, cigarettes, uncomplicated: Secondary | ICD-10-CM

## 2019-09-11 DIAGNOSIS — Z122 Encounter for screening for malignant neoplasm of respiratory organs: Secondary | ICD-10-CM

## 2019-09-22 ENCOUNTER — Other Ambulatory Visit: Payer: Self-pay | Admitting: Cardiology

## 2019-10-05 ENCOUNTER — Other Ambulatory Visit: Payer: Self-pay | Admitting: Cardiology

## 2019-10-05 DIAGNOSIS — I10 Essential (primary) hypertension: Secondary | ICD-10-CM

## 2019-10-13 ENCOUNTER — Other Ambulatory Visit: Payer: Self-pay | Admitting: Cardiology

## 2019-10-31 ENCOUNTER — Ambulatory Visit: Payer: Federal, State, Local not specified - PPO | Admitting: Cardiology

## 2019-10-31 ENCOUNTER — Encounter: Payer: Self-pay | Admitting: Cardiology

## 2019-10-31 ENCOUNTER — Other Ambulatory Visit: Payer: Self-pay

## 2019-10-31 VITALS — BP 112/60 | HR 75 | Ht 68.0 in | Wt 168.8 lb

## 2019-10-31 DIAGNOSIS — I2583 Coronary atherosclerosis due to lipid rich plaque: Secondary | ICD-10-CM

## 2019-10-31 DIAGNOSIS — I10 Essential (primary) hypertension: Secondary | ICD-10-CM

## 2019-10-31 DIAGNOSIS — I6523 Occlusion and stenosis of bilateral carotid arteries: Secondary | ICD-10-CM | POA: Diagnosis not present

## 2019-10-31 DIAGNOSIS — I2510 Atherosclerotic heart disease of native coronary artery without angina pectoris: Secondary | ICD-10-CM | POA: Diagnosis not present

## 2019-10-31 DIAGNOSIS — E785 Hyperlipidemia, unspecified: Secondary | ICD-10-CM

## 2019-10-31 MED ORDER — VARENICLINE TARTRATE 0.5 MG PO TABS: 0.5000 mg | ORAL_TABLET | Freq: Two times a day (BID) | ORAL | 0 refills | Status: DC

## 2019-10-31 MED ORDER — VARENICLINE TARTRATE 1 MG PO TABS: 1.0000 mg | ORAL_TABLET | Freq: Two times a day (BID) | ORAL | 2 refills | Status: DC

## 2019-10-31 MED ORDER — NITROGLYCERIN 0.4 MG SL SUBL: 0.4000 mg | SUBLINGUAL_TABLET | SUBLINGUAL | 2 refills | Status: DC | PRN

## 2019-10-31 MED ORDER — ISOSORBIDE MONONITRATE ER 30 MG PO TB24: 15.0000 mg | ORAL_TABLET | Freq: Every day | ORAL | 3 refills | Status: DC

## 2019-10-31 NOTE — Progress Notes (Addendum)
Cardiology Office Note:    Date:  10/31/2019   ID:  [name], DOB 06-04-1956, MRN [id number]  PCP:  Leanna Battles, MD  Cardiologist:  Fransico Him, MD    Referring MD: Leanna Battles, MD   Chief Complaint  Patient presents with   Coronary Artery Disease   Hypertension   Hyperlipidemia    History of Present Illness:    Donald Jennings is a 63 y.o. male with a hx of CAD, HTN, HLD and ASCAD with cath 07/2017 showing total occlusion of the mLAD with left to left collaterals treated with PCI/DES x1, and 75% lesion in the distal RCA treated with PCI/DES x1. Normal LV function and LVEDP.He was placed onDAPT with ASA/plavix. He developed recurrent jaw and gum pain in June similar to his angina in the past with abnormal stress test showing 1.5 mm horizontal ST depression and a new apical and anterior apical defects on Myoview imaging.  Repeat  Cath showed no significant changes.   He is here today for followup and is doing well.  He still has intermittent jaw pain from time to time and has had some issues with heartburn if he eats too late in the evening or with spicy foods.  He denies any anginal chest pain or pressure, SOB, DOE, PND, orthopnea, LE edema, dizziness, palpitations or syncope. His wife told him that his HR was elevated around 100bpm one night after he smoked a cigarette.  He also has been complaining of cough which he thinks is related to smoking.  He continues to smoke 10 cig a day.  He never tried the Chantix I prescribed to him a few years ago.He is compliant with his meds and is tolerating meds with no SE.    Past Medical History:  Diagnosis Date   Abnormal nuclear stress test 06/13/2018   Abnormal thallium stress test 05/20/2016   Amaurosis fugax 06/03/2016   CAD (coronary artery disease), native coronary artery 07/27/2017   < 40% prox LAD, 50% mid LAD and 40% distal RCA   Carotid artery stenosis 07/27/2017   1-39% bilateral   CERVICAL RADICULOPATHY 07/02/2010   Qualifier: Diagnosis of  By: Ronnald Ramp MD, Arvid Right.    Coronary artery disease due to lipid rich plaque    Coronary artery disease with exertional angina ([place]) 08/04/2017   DEPRESSION 03/21/2009   Qualifier: Diagnosis of  By: Ronnald Ramp MD, Arvid Right.    Diabetes mellitus without complication ([place])    Dysphagia 05/18/2016   Eczema, dyshidrotic 05/27/2015   ELECTROCARDIOGRAM, ABNORMAL 03/21/2009   Essential hypertension 06/19/2009        GERD 06/19/2009   Qualifier: Diagnosis of  By: Ronnald Ramp MD, Arvid Right.    HLD (hyperlipidemia)    Hx of cardiovascular stress test    ETT-Myoview (03/2014):  No ischemia, EF 56%; Low Risk   Hyperlipidemia LDL goal <70 06/03/2016   Lytic bone lesions on xray 02/22/2017   Neuropathy of both feet 05/28/2015   Pure hypercholesterolemia    Routine general medical examination at a health care facility 04/04/2013   SHOULDER INJURY, RIGHT 07/02/2010   Qualifier: Diagnosis of  By: Ronnald Ramp MD, Arvid Right.    SOB (shortness of breath) 06/10/2016   Solitary plasmacytoma of bone ([place]) 03/05/2017   per pt/was not cancer   TOBACCO USE 03/21/2009   Qualifier: Diagnosis of  By: Ronnald Ramp MD, Arvid Right.    Type 2 diabetes mellitus with manifestations ([place]) 03/27/2015   not started the medication yet!  Unstable angina ([facility])    Vertebral artery stenosis, asymptomatic 06/22/2016   Vitamin D deficiency 04/04/2013    Past Surgical History:  Procedure Laterality Date   CARDIAC CATHETERIZATION  2018   placement of one stent   CATARACT EXTRACTION Bilateral    Left 2009 at [facility], Right 2004 in [place]  07/25/2015   Dr Carol Ada   CORONARY STENT INTERVENTION N/A 08/04/2017   Procedure: CORONARY STENT INTERVENTION;  Surgeon: Sherren Mocha, MD;  Location: [facility];  Service: Cardiovascular;  Laterality: N/A;   ESOPHAGEAL MANOMETRY N/A 11/14/2018   Procedure: ESOPHAGEAL MANOMETRY (EM);  Surgeon: Gatha Mayer, MD;  Location: [facility];  Service:  Endoscopy;  Laterality: N/A;   INGUINAL HERNIA REPAIR Right 1986   IR FLUORO GUIDED NEEDLE PLC ASPIRATION/INJECTION LOC  03/02/2017   IR FLUORO GUIDED NEEDLE PLC ASPIRATION/INJECTION LOC  03/02/2017   IR FLUORO GUIDED NEEDLE PLC ASPIRATION/INJECTION LOC  03/02/2017   IR RADIOLOGIST EVAL & MGMT  03/01/2017   LEFT HEART CATH AND CORONARY ANGIOGRAPHY N/A 08/04/2017   Procedure: LEFT HEART CATH AND CORONARY ANGIOGRAPHY;  Surgeon: Sherren Mocha, MD;  Location: [facility];  Service: Cardiovascular;  Laterality: N/A;   LEFT HEART CATH AND CORONARY ANGIOGRAPHY N/A 06/14/2018   Procedure: LEFT HEART CATH AND CORONARY ANGIOGRAPHY;  Surgeon: Troy Sine, MD;  Location: [facility];  Service: Cardiovascular;  Laterality: N/A;    Current Medications: Current Meds  Medication Sig   aspirin EC 81 MG tablet Take 81 mg by mouth at bedtime.   clopidogrel (PLAVIX) 75 MG tablet Take 1 tablet (75 mg total) by mouth at bedtime. Please keep upcoming appt in December with Dr. Radford Pax for future refills. Thank you   diclofenac sodium (VOLTAREN) 1 % GEL Apply 1 application topically as needed for pain.   Flax OIL Take by mouth. Take one pill daily   lidocaine (LIDODERM) 5 % Place 1 patch onto the skin daily as needed (pain). Remove & Discard patch within 12 hours or as directed by MD    losartan (COZAAR) 100 MG tablet TAKE 1 TABLET BY MOUTH EVERY DAY   metoprolol succinate (TOPROL-XL) 25 MG 24 hr tablet Take 2 tablets (50 mg total) by mouth daily. Please keep upcoming appt in December with Dr. Radford Pax for future refills. Thank you   Multiple Vitamins-Minerals (MULTIVITAMIN ADULT) TABS Take 1 tablet by mouth at bedtime.    nitroGLYCERIN (NITROSTAT) 0.4 MG SL tablet Place 1 tablet (0.4 mg total) under the tongue every 5 (five) minutes as needed.   pantoprazole (PROTONIX) 40 MG tablet Take 1 tablet (40 mg total) by mouth daily.   rosuvastatin (CRESTOR) 40 MG tablet Take 1 tablet (40 mg total)  by mouth daily.   varenicline (CHANTIX CONTINUING MONTH PAK) 1 MG tablet Take 1 tablet (1 mg total) by mouth 2 (two) times daily.   Vitamin D, Ergocalciferol, (DRISDOL) 50000 units CAPS capsule TAKE 1 CAPSULE (50,000 UNITS TOTAL) BY MOUTH ONCE A WEEK.   [DISCONTINUED] nitroGLYCERIN (NITROSTAT) 0.4 MG SL tablet Place 1 tablet (0.4 mg total) under the tongue every 5 (five) minutes as needed.     Allergies:   Pork-derived products   Social History   Socioeconomic History   Marital status: Married    Spouse name: Not on file   Number of children: 1   Years of education: Not on file   Highest education level: Not on file  Occupational History  Occupation: Emergency planning/management officer: Lawyer strain: Not on file   Food insecurity    Worry: Not on file    Inability: Not on file   Transportation needs    Medical: Not on file    Non-medical: Not on file  Tobacco Use   Smoking status: Current Every Day Smoker    Packs/day: 0.05    Years: 38.50    Pack years: 1.92    Types: Cigarettes   Smokeless tobacco: Never Used   Tobacco comment: Currently  smokes 1/2-1pk ppd and is considering Chantix  Substance and Sexual Activity   Alcohol use: No   Drug use: No   Sexual activity: Not on file  Lifestyle   Physical activity    Days per week: Not on file    Minutes per session: Not on file   Stress: Not on file  Relationships   Social connections    Talks on phone: Not on file    Gets together: Not on file    Attends religious service: Not on file    Active member of club or organization: Not on file    Attends meetings of clubs or organizations: Not on file    Relationship status: Not on file  Other Topics Concern   Not on file  Social History Narrative   Married 1 sone born 1998   From [place]   Manager [employer]   2 caffeine drinks daily   Smoker, no alcohol no drugs     Family History: The  patient's family history includes Arthritis in his mother; CAD in his brother and brother; Diabetes in his mother; Gallstones in his brother; Heart disease in his brother, brother, and father; Hypertension in his father; Kidney Stones in his brother; Stroke in his brother and father. There is no history of Early death, Hyperlipidemia, Kidney disease, or Cancer.  ROS:   Please see the history of present illness.    ROS  All other systems reviewed and negative.   EKGs/Labs/Other Studies Reviewed:    The following studies were reviewed today: none  EKG:  EKG is not ordered today.    Recent Labs: No results found for requested labs within last 8760 hours.   Recent Lipid Panel    Component Value Date/Time   CHOL 100 06/14/2018 0334   CHOL 96 (L) 09/13/2017 0930   TRIG 104 06/14/2018 0334   HDL 33 (L) 06/14/2018 0334   HDL 33 (L) 09/13/2017 0930   CHOLHDL 3.0 06/14/2018 0334   VLDL 21 06/14/2018 0334   LDLCALC 46 06/14/2018 0334   LDLCALC 45 09/13/2017 0930   LDLDIRECT 167.4 03/21/2009 1030    Physical Exam:    VS:  BP 112/60    Pulse 75    Ht 5\' 8"  (1.727 m)    Wt 168 lb 12.8 oz (76.6 kg)    SpO2 97%    BMI 25.67 kg/m     Wt Readings from Last 3 Encounters:  10/31/19 168 lb 12.8 oz (76.6 kg)  01/02/19 161 lb (73 kg)  12/26/18 163 lb 3.2 oz (74 kg)     GEN:  Well nourished, well developed in no acute distress HEENT: Normal NECK: No JVD; No carotid bruits LYMPHATICS: No lymphadenopathy CARDIAC: RRR, no murmurs, rubs, gallops RESPIRATORY:  Clear to auscultation without rales, wheezing or rhonchi  ABDOMEN: Soft, non-tender, non-distended MUSCULOSKELETAL:  No edema; No deformity  SKIN: Warm and dry  NEUROLOGIC:  Alert and oriented x 3 PSYCHIATRIC:  Normal affect   ASSESSMENT:    1. Coronary artery disease due to lipid rich plaque   2. Essential hypertension   3. Hyperlipidemia LDL goal <70   4. Bilateral carotid artery stenosis    PLAN:    In order of problems  listed above:  1.  ASCAD -cath 07/2017 showing total occlusion of the mLAD with left to left collaterals treated with PCI/DES x1, and 75% lesion in the distal RCA treated with PCI/DES x1.   -Repeat cardiac cath 05/2018 for chest pain and abnormal nuclear stress test showed no change from prior cath a year ago with patent stents in the RCA and mid LAD.  There is a 40% PDA and 40% mid RCA. -he still has jaw pain from time to time but it is not related to exertion.  Seems to occur more after meals -I will start Imdur 15mg  daily in case this is angina but suspect it is esophageal spasm from GERD but should improve as well with nitrates -followup with PA in 4 weeks -continue ASA, Plavix 75mg  daily, BB and statin.   2.  Bilateral carotid artery stenosis -carotid dopplers 08/2018 showed no significant stenosis  3.  HTN -BP controlled -continue Toprol XL 50mg  daily and Losartan 100mg  daily -Repeat BMET  4.  HLD -LDL goal < 70 -repeat FLP and ALT -continue Crestor 40mg  daily  5.  Tobacco abuse -he never filled the Rx for Chantix that I prescribed a few years ago -He continues to smoke 10 cig daily -he has a cough as well -I have encouraged him to go on the Chantix and start to cut back over the next 2 weeks -I also encouraged him to make an appt with his PCP for evaluation of his cough   Medication Adjustments/Labs and Tests Ordered: Current medicines are reviewed at length with the patient today.  Concerns regarding medicines are outlined above.  No orders of the defined types were placed in this encounter.  Meds ordered this encounter  Medications   nitroGLYCERIN (NITROSTAT) 0.4 MG SL tablet    Sig: Place 1 tablet (0.4 mg total) under the tongue every 5 (five) minutes as needed.    Dispense:  25 tablet    Refill:  2    Signed, Fransico Him, MD  10/31/2019 12:46 PM    [place]

## 2019-10-31 NOTE — Patient Instructions (Signed)
Medication Instructions:  Your physician has recommended you make the following change in your medication:  1) Start taking Imdur (isosorbide mononitrate) 0.5 tablet (15 mg) daily. 2) Start taking Chantix (varenicline) as directed.   *If you need a refill on your cardiac medications before your next appointment, please call your pharmacy*  Lab Work: TODAY: Fasting lipids and CMET  If you have labs (blood work) drawn today and your tests are completely normal, you will receive your results only by: [name] MyChart Message (if you have MyChart) OR . A paper copy in the mail If you have any lab test that is abnormal or we need to change your treatment, we will call you to review the results.  Testing/Procedures: None ordered.   Follow-Up: At [facility], you and your health needs are our priority.  As part of our continuing mission to provide you with exceptional heart care, we have created designated Provider Care Teams.  These Care Teams include your primary Cardiologist (physician) and Advanced Practice Providers (APPs -  Physician Assistants and Nurse Practitioners) who all work together to provide you with the care you need, when you need it.  Your next appointment:   January 5th, 2021 at 1:00PM.   The format for your next appointment:   Virtual  Provider:   Ermalinda Barrios, PA-C  Follow up with Dr. Radford Pax in 1 year.

## 2019-11-01 LAB — COMPREHENSIVE METABOLIC PANEL
ALT: 16 IU/L (ref 0–44)
AST: 17 IU/L (ref 0–40)
Albumin/Globulin Ratio: 1.7 (ref 1.2–2.2)
Albumin: 4.5 g/dL (ref 3.8–4.8)
Alkaline Phosphatase: 69 IU/L (ref 39–117)
BUN/Creatinine Ratio: 19 (ref 10–24)
BUN: 17 mg/dL (ref 8–27)
Bilirubin Total: 0.3 mg/dL (ref 0.0–1.2)
CO2: 24 mmol/L (ref 20–29)
Calcium: 9.4 mg/dL (ref 8.6–10.2)
Chloride: 105 mmol/L (ref 96–106)
Creatinine, Ser: 0.91 mg/dL (ref 0.76–1.27)
GFR calc Af Amer: 104 mL/min/{1.73_m2} (ref >59)
GFR calc non Af Amer: 90 mL/min/{1.73_m2} (ref >59)
Globulin, Total: 2.6 g/dL (ref 1.5–4.5)
Glucose: 117 mg/dL — ABNORMAL HIGH (ref 65–99)
Potassium: 4.5 mmol/L (ref 3.5–5.2)
Sodium: 141 mmol/L (ref 134–144)
Total Protein: 7.1 g/dL (ref 6.0–8.5)

## 2019-11-01 LAB — LIPID PANEL
Chol/HDL Ratio: 2.8 ratio (ref 0.0–5.0)
Cholesterol, Total: 101 mg/dL (ref 100–199)
HDL: 36 mg/dL — ABNORMAL LOW (ref >39)
LDL Chol Calc (NIH): 46 mg/dL (ref 0–99)
Triglycerides: 98 mg/dL (ref 0–149)
VLDL Cholesterol Cal: 19 mg/dL (ref 5–40)

## 2019-11-03 ENCOUNTER — Other Ambulatory Visit: Payer: Self-pay | Admitting: Cardiology

## 2019-11-07 ENCOUNTER — Other Ambulatory Visit: Payer: Self-pay | Admitting: Cardiology

## 2019-11-07 DIAGNOSIS — E118 Type 2 diabetes mellitus with unspecified complications: Secondary | ICD-10-CM

## 2019-11-07 DIAGNOSIS — I10 Essential (primary) hypertension: Secondary | ICD-10-CM

## 2019-11-07 MED ORDER — LOSARTAN POTASSIUM 100 MG PO TABS: 100.0000 mg | ORAL_TABLET | Freq: Every day | ORAL | 3 refills | Status: DC

## 2019-11-15 DIAGNOSIS — J439 Emphysema, unspecified: Secondary | ICD-10-CM | POA: Diagnosis not present

## 2019-11-15 DIAGNOSIS — F17210 Nicotine dependence, cigarettes, uncomplicated: Secondary | ICD-10-CM | POA: Diagnosis not present

## 2019-11-15 DIAGNOSIS — Z9861 Coronary angioplasty status: Secondary | ICD-10-CM | POA: Diagnosis not present

## 2019-11-15 DIAGNOSIS — K219 Gastro-esophageal reflux disease without esophagitis: Secondary | ICD-10-CM | POA: Diagnosis not present

## 2019-11-17 ENCOUNTER — Other Ambulatory Visit (HOSPITAL_COMMUNITY): Payer: Self-pay | Admitting: Respiratory Therapy

## 2019-11-17 DIAGNOSIS — J438 Other emphysema: Secondary | ICD-10-CM

## 2019-11-27 ENCOUNTER — Encounter (HOSPITAL_COMMUNITY): Payer: Federal, State, Local not specified - PPO

## 2019-12-04 NOTE — Progress Notes (Signed)
Virtual Visit via Telephone Note   This visit type was conducted due to national recommendations for restrictions regarding the COVID-19 Pandemic (e.g. social distancing) in an effort to limit this patient's exposure and mitigate transmission in our community.  Due to his co-morbid illnesses, this patient is at least at moderate risk for complications without adequate follow up.  This format is felt to be most appropriate for this patient at this time.  The patient did not have access to video technology/had technical difficulties with video requiring transitioning to audio format only (telephone).  All issues noted in this document were discussed and addressed.  No physical exam could be performed with this format.  Please refer to the patient's chart for his  consent to telehealth for [facility].   Date:  12/05/2019   ID:  Donald Jennings, DOB Sep 20, 1956, MRN [id number]  Patient Location: Home Provider Location: Home  PCP:  Leanna Battles, MD  Cardiologist:  Fransico Him, MD   Electrophysiologist:  None   Evaluation Performed:  Follow-Up Visit  Chief Complaint:   F/U  History of Present Illness:    Donald Jennings is a 64 y.o. male with history of CAD status post DES to the mid LAD and distal RCA 07/2017, normal LVEF, hypertension, HLD.  Recurrent jaw pain similar to his angina in the past 04/2018 with abnormal stress test showing 1-1/2 mm horizontal ST depression new apical anterior apical defects on Myoview.  Repeat cath showed no significant change.  Last office visit with Dr. Radford Pax 10/31/2019 he was having intermittent jaw pain and also had elevated heart rates up to 100 after smoking a cigarette.  She recommended Chantix.  Imdur started for possible angina but she felt it also could be esophageal spasm as he was having a lot of GI symptoms.  Patient hasn't started Chantix because of expense but has cut down on smoking to 1/2 ppd. Has been trying to get in more exercise. Was walking 4 times  a week but since pandemic only walking once a week and thinks he's gotten weaker and tires more easily. Works part time at [employer]. Jaw pain has decreased and has only had one episode since Imdur started and much less intense. One episode of heart racing after smoking a cigarette and hot bath. HR was 122 and came down with rest.  The patient does not have symptoms concerning for COVID-19 infection (fever, chills, cough, or new shortness of breath).    Past Medical History:  Diagnosis Date  . Abnormal nuclear stress test 06/13/2018  . Abnormal thallium stress test 05/20/2016  . Amaurosis fugax 06/03/2016  . CAD (coronary artery disease), native coronary artery 07/27/2017   < 40% prox LAD, 50% mid LAD and 40% distal RCA  . Carotid artery stenosis 07/27/2017   1-39% bilateral  . CERVICAL RADICULOPATHY 07/02/2010   Qualifier: Diagnosis of  By: Ronnald Ramp MD, Arvid Right.   . Coronary artery disease due to lipid rich plaque   . Coronary artery disease with exertional angina ([place]) 08/04/2017  . DEPRESSION 03/21/2009   Qualifier: Diagnosis of  By: Ronnald Ramp MD, Arvid Right.   . Diabetes mellitus without complication ([place])   . Dysphagia 05/18/2016  . Eczema, dyshidrotic 05/27/2015  . ELECTROCARDIOGRAM, ABNORMAL 03/21/2009  . Essential hypertension 06/19/2009       . GERD 06/19/2009   Qualifier: Diagnosis of  By: Ronnald Ramp MD, Arvid Right.   [name] HLD (hyperlipidemia)   . Hx of cardiovascular stress test  ETT-Myoview (03/2014):  No ischemia, EF 56%; Low Risk  . Hyperlipidemia LDL goal <70 06/03/2016  . Lytic bone lesions on xray 02/22/2017  . Neuropathy of both feet 05/28/2015  . Pure hypercholesterolemia   . Routine general medical examination at a health care facility 04/04/2013  . SHOULDER INJURY, RIGHT 07/02/2010   Qualifier: Diagnosis of  By: Ronnald Ramp MD, Arvid Right.   . SOB (shortness of breath) 06/10/2016  . Solitary plasmacytoma of bone ([place]) 03/05/2017   per pt/was not cancer  . TOBACCO USE 03/21/2009   Qualifier:  Diagnosis of  By: Ronnald Ramp MD, Arvid Right.   . Type 2 diabetes mellitus with manifestations ([place]) 03/27/2015   not started the medication yet!  [name] Unstable angina ([place])   . Vertebral artery stenosis, asymptomatic 06/22/2016  . Vitamin D deficiency 04/04/2013   Past Surgical History:  Procedure Laterality Date  . CARDIAC CATHETERIZATION  2018   placement of one stent  . CATARACT EXTRACTION Bilateral    Left 2009 at [facility], Right 2004 in [place]  . COLONOSCOPY  07/25/2015   Dr Carol Ada  . CORONARY STENT INTERVENTION N/A 08/04/2017   Procedure: CORONARY STENT INTERVENTION;  Surgeon: Sherren Mocha, MD;  Location: [place] [facility];  Service: Cardiovascular;  Laterality: N/A;  . ESOPHAGEAL MANOMETRY N/A 11/14/2018   Procedure: ESOPHAGEAL MANOMETRY (EM);  Surgeon: Gatha Mayer, MD;  Location: [facility];  Service: Endoscopy;  Laterality: N/A;  . INGUINAL HERNIA REPAIR Right 1986  . IR FLUORO GUIDED NEEDLE PLC ASPIRATION/INJECTION LOC  03/02/2017  . IR FLUORO GUIDED NEEDLE PLC ASPIRATION/INJECTION LOC  03/02/2017  . IR FLUORO GUIDED NEEDLE PLC ASPIRATION/INJECTION LOC  03/02/2017  . IR RADIOLOGIST EVAL & MGMT  03/01/2017  . LEFT HEART CATH AND CORONARY ANGIOGRAPHY N/A 08/04/2017   Procedure: LEFT HEART CATH AND CORONARY ANGIOGRAPHY;  Surgeon: Sherren Mocha, MD;  Location: [place] [facility];  Service: Cardiovascular;  Laterality: N/A;  . LEFT HEART CATH AND CORONARY ANGIOGRAPHY N/A 06/14/2018   Procedure: LEFT HEART CATH AND CORONARY ANGIOGRAPHY;  Surgeon: Troy Sine, MD;  Location: [place] [facility];  Service: Cardiovascular;  Laterality: N/A;     Current Meds  Medication Sig  . aspirin EC 81 MG tablet Take 81 mg by mouth at bedtime.  . clopidogrel (PLAVIX) 75 MG tablet Take 1 tablet (75 mg total) by mouth at bedtime. Please keep upcoming appt in December with Dr. Radford Pax for future refills. Thank you  . diclofenac sodium (VOLTAREN) 1 % GEL Apply 1 application topically as needed for  pain.  . isosorbide mononitrate (IMDUR) 30 MG 24 hr tablet Take 0.5 tablets (15 mg total) by mouth daily.  [name] lidocaine (LIDODERM) 5 % Place 1 patch onto the skin daily as needed (pain). Remove & Discard patch within 12 hours or as directed by MD   . losartan (COZAAR) 100 MG tablet Take 1 tablet (100 mg total) by mouth daily.  . metFORMIN (GLUCOPHAGE-XR) 500 MG 24 hr tablet Take 500 mg by mouth daily with breakfast.  . metoprolol succinate (TOPROL-XL) 25 MG 24 hr tablet Take 2 tablets (50 mg total) by mouth daily. Please keep upcoming appt in December with Dr. Radford Pax for future refills. Thank you  . Multiple Vitamins-Minerals (MULTIVITAMIN ADULT) TABS Take 1 tablet by mouth at bedtime.   . nitroGLYCERIN (NITROSTAT) 0.4 MG SL tablet Place 1 tablet (0.4 mg total) under the tongue every 5 (five) minutes as needed.  . pantoprazole (PROTONIX) 40 MG tablet Take  1 tablet (40 mg total) by mouth daily.  . rosuvastatin (CRESTOR) 40 MG tablet Take 1 tablet (40 mg total) by mouth daily.  . Vitamin D, Ergocalciferol, (DRISDOL) 50000 units CAPS capsule TAKE 1 CAPSULE (50,000 UNITS TOTAL) BY MOUTH ONCE A WEEK.     Allergies:   Pork-derived products   Social History   Tobacco Use  . Smoking status: Current Every Day Smoker    Packs/day: 0.05    Years: 38.50    Pack years: 1.92    Types: Cigarettes  . Smokeless tobacco: Never Used  . Tobacco comment: Currently  smokes 1/2-1pk ppd and is considering Chantix  Substance Use Topics  . Alcohol use: No  . Drug use: No     Family Hx: [name] family history includes Arthritis in his mother; CAD in his brother and brother; Diabetes in his mother; Gallstones in his brother; Heart disease in his brother, brother, and father; Hypertension in his father; Kidney Stones in his brother; Stroke in his brother and father. There is no history of Early death, Hyperlipidemia, Kidney disease, or Cancer.  ROS:   Please see the history of present illness.      All  other systems reviewed and are negative.   Prior CV studies:   The following studies were reviewed today:   Catheterization 7/16/2019Previously placed Prox LAD to Mid LAD stent (unknown type) is widely patent.  Mid RCA lesion is 40% stenosed.  Prox RCA lesion is 20% stenosed.  Post Atrio lesion is 40% stenosed.  Previously placed Dist RCA stent (unknown type) is widely patent.  The left ventricular ejection fraction is greater than 65% by visual estimate.  The left ventricular systolic function is normal.  LV end diastolic pressure is normal.   Hyperdynamic LV function with an ejection fraction of 65 to 70% without focal segmental wall motion abnormalities.   Widely patent previously placed stents in the mid LAD after the first diagonal vessel and in the distal RCA.  The LAD tapers to a very small caliber vessel and ends just proximal to the apex.  The ramus intermediate, and left circumflex vessel are angiographically normal.  The RCA is a dominant vessel with mild luminal irregularity diffusely with 20% proximal stenosis, 40% mid stenosis.  The stent in the distal RCA after the acute margin and proximal to the PDA is widely patent.  There is a 40% continuation branch stenosis to the PLA vessel which actually is somewhat improved from the last catheterization.   RECOMMENDATION: Medical therapy.  With the patient's positive ECG changes on the stress test with suggestion of possible apical ischemia will further titrate beta-blocker therapy and increase Toprol-XL to 50 mg daily.   Recommend continuation of uninterrupted dual antiplatelet therapy with aspirin/Plavix.    NST 7/15/2019Nuclear stress EF: 59%. Normal wall motion and left ventricular function.  Blood pressure demonstrated a normal response to exercise.  Horizontal ST segment depression ST segment depression of 2 mm was noted during stress in the III, aVF and II leads, beginning at 4 minutes of stress, and returning to  baseline after 1-5 minutes of recovery.  Defect 1: There is a medium defect of moderate severity present in the apical anterior, apical septal and apex location.  Findings consistent with prior myocardial infarction with peri-infarct ischemia.  The left ventricular ejection fraction is normal (55-65%).  This is an intermediate risk study.     Labs/Other Tests and Data Reviewed:    EKG:  No ECG reviewed.  Recent Labs: 10/31/2019: ALT 16; BUN 17; Creatinine, Ser 0.91; Potassium 4.5; Sodium 141   Recent Lipid Panel Lab Results  Component Value Date/Time   CHOL 101 10/31/2019 01:31 PM   TRIG 98 10/31/2019 01:31 PM   HDL 36 (L) 10/31/2019 01:31 PM   CHOLHDL 2.8 10/31/2019 01:31 PM   CHOLHDL 3.0 06/14/2018 03:34 AM   LDLCALC 46 10/31/2019 01:31 PM   LDLDIRECT 167.4 03/21/2009 10:30 AM    Wt Readings from Last 3 Encounters:  12/05/19 169 lb (76.7 kg)  10/31/19 168 lb 12.8 oz (76.6 kg)  01/02/19 161 lb (73 kg)     Objective:    Vital Signs:  BP 122/64   Pulse 81   Ht 5\' 8"  (1.727 m)   Wt 169 lb (76.7 kg)   SpO2 95%   BMI 25.70 kg/m    VITAL SIGNS:  reviewed  ASSESSMENT & PLAN:    1. CAD status post stenting to the mid LAD and distal RCA 2018, stents widely patent on cath 05/2018 after abnormal NST. with normal LVEF.  Complained of some jaw pain and started on low-dose Imdur but also felt it could be secondary to esophageal spasm. This has improved dramatically on Imdur.  2. Essential hypertension BP controlled 3. Hyperlipidemia on Crestor 46-10/31/19 4. Tobacco abuse Chantix prescribed but too expensive so trying to cut back smoking with nicotine gum. Down to 1/2 ppd 5. Palpitations after smoking-on metoprolol and trying to quit.  COVID-19 Education: The signs and symptoms of COVID-19 were discussed with the patient and how to seek care for testing (follow up with PCP or arrange E-visit).   The importance of social distancing was discussed today.  Time:   Today, I  have spent 12:20 minutes with the patient with telehealth technology discussing the above problems.     Medication Adjustments/Labs and Tests Ordered: Current medicines are reviewed at length with the patient today.  Concerns regarding medicines are outlined above.   Tests Ordered: No orders of the defined types were placed in this encounter.   Medication Changes: No orders of the defined types were placed in this encounter.   Follow Up:  Either In Person or Virtual in 4 month(s) Dr. Radford Pax   Signed, Ermalinda Barrios, PA-C  12/05/2019 1:25 PM    [place]

## 2019-12-05 ENCOUNTER — Other Ambulatory Visit: Payer: Self-pay

## 2019-12-05 ENCOUNTER — Encounter: Payer: Self-pay | Admitting: Physician Assistant

## 2019-12-05 ENCOUNTER — Telehealth (INDEPENDENT_AMBULATORY_CARE_PROVIDER_SITE_OTHER): Payer: Federal, State, Local not specified - PPO | Admitting: Physician Assistant

## 2019-12-05 VITALS — BP 122/64 | HR 81 | Ht 68.0 in | Wt 169.0 lb

## 2019-12-05 DIAGNOSIS — I2510 Atherosclerotic heart disease of native coronary artery without angina pectoris: Secondary | ICD-10-CM

## 2019-12-05 DIAGNOSIS — E7800 Pure hypercholesterolemia, unspecified: Secondary | ICD-10-CM

## 2019-12-05 DIAGNOSIS — I10 Essential (primary) hypertension: Secondary | ICD-10-CM | POA: Diagnosis not present

## 2019-12-05 DIAGNOSIS — F17200 Nicotine dependence, unspecified, uncomplicated: Secondary | ICD-10-CM | POA: Diagnosis not present

## 2019-12-05 DIAGNOSIS — I2583 Coronary atherosclerosis due to lipid rich plaque: Secondary | ICD-10-CM

## 2019-12-05 NOTE — Patient Instructions (Signed)
Medication Instructions:  No changes *If you need a refill on your cardiac medications before your next appointment, please call your pharmacy*  Lab Work: none If you have labs (blood work) drawn today and your tests are completely normal, you will receive your results only by: [name] MyChart Message (if you have MyChart) OR . A paper copy in the mail If you have any lab test that is abnormal or we need to change your treatment, we will call you to review the results.  Testing/Procedures: none  Follow-Up: At [facility], you and your health needs are our priority.  As part of our continuing mission to provide you with exceptional heart care, we have created designated Provider Care Teams.  These Care Teams include your primary Cardiologist (physician) and Advanced Practice Providers (APPs -  Physician Assistants and Nurse Practitioners) who all work together to provide you with the care you need, when you need it.  Your next appointment:   4 month(s)  The format for your next appointment:   Either In Person or Virtual  Provider:   Fransico Him, MD  Other Instructions Your physician discussed the importance of regular exercise and recommended that you start or continue a regular exercise program for good health with a goal of 150 minutes per week.  Your physician discussed the hazards of tobacco use. Tobacco use cessation is recommended and techniques and options to help you quit were discussed.

## 2019-12-15 DIAGNOSIS — E119 Type 2 diabetes mellitus without complications: Secondary | ICD-10-CM | POA: Diagnosis not present

## 2019-12-15 DIAGNOSIS — E559 Vitamin D deficiency, unspecified: Secondary | ICD-10-CM | POA: Diagnosis not present

## 2019-12-15 DIAGNOSIS — E7849 Other hyperlipidemia: Secondary | ICD-10-CM | POA: Diagnosis not present

## 2019-12-21 DIAGNOSIS — I10 Essential (primary) hypertension: Secondary | ICD-10-CM | POA: Diagnosis not present

## 2019-12-21 DIAGNOSIS — R82998 Other abnormal findings in urine: Secondary | ICD-10-CM | POA: Diagnosis not present

## 2019-12-22 DIAGNOSIS — E119 Type 2 diabetes mellitus without complications: Secondary | ICD-10-CM | POA: Diagnosis not present

## 2019-12-22 DIAGNOSIS — I10 Essential (primary) hypertension: Secondary | ICD-10-CM | POA: Diagnosis not present

## 2019-12-22 DIAGNOSIS — Z0000 Encounter for general adult medical examination without abnormal findings: Secondary | ICD-10-CM | POA: Diagnosis not present

## 2019-12-22 DIAGNOSIS — Z9861 Coronary angioplasty status: Secondary | ICD-10-CM | POA: Diagnosis not present

## 2019-12-22 DIAGNOSIS — E785 Hyperlipidemia, unspecified: Secondary | ICD-10-CM | POA: Diagnosis not present

## 2019-12-27 DIAGNOSIS — Z1212 Encounter for screening for malignant neoplasm of rectum: Secondary | ICD-10-CM | POA: Diagnosis not present

## 2019-12-28 ENCOUNTER — Other Ambulatory Visit: Payer: Self-pay | Admitting: Cardiology

## 2019-12-28 DIAGNOSIS — I10 Essential (primary) hypertension: Secondary | ICD-10-CM

## 2020-01-09 ENCOUNTER — Other Ambulatory Visit: Payer: Self-pay | Admitting: Cardiology

## 2020-04-05 ENCOUNTER — Telehealth: Payer: Self-pay | Admitting: Cardiology

## 2020-04-05 DIAGNOSIS — K219 Gastro-esophageal reflux disease without esophagitis: Secondary | ICD-10-CM | POA: Diagnosis not present

## 2020-04-05 DIAGNOSIS — I119 Hypertensive heart disease without heart failure: Secondary | ICD-10-CM | POA: Diagnosis not present

## 2020-04-05 DIAGNOSIS — Z9861 Coronary angioplasty status: Secondary | ICD-10-CM | POA: Diagnosis not present

## 2020-04-05 DIAGNOSIS — R079 Chest pain, unspecified: Secondary | ICD-10-CM | POA: Diagnosis not present

## 2020-04-05 NOTE — Telephone Encounter (Signed)
Anderson Malta with [facility] is calling to make Dr. Radford Pax aware that she is faxing patient's EKG results to the office. She states the patient has also been experiencing chest pain with CAD, however she is not certain of how long he has had chest pain or whether he has had any other symptoms. An appointment has been scheduled for 04/18/20 at 11:20 AM with Dr. Radford Pax. However, if possible, Anderson Malta is requesting an appointment with Dr. Radford Pax or an APP for a date next week. Please assist. ?

## 2020-04-05 NOTE — Telephone Encounter (Signed)
Spoke with the patient who states that he had an episode of chest pain last Saturday. He denies any associated symptoms. He took one nitroglycerin and pain was relieved. The next day he woke up and states that he had some mild pain that he described as a soreness and irritation that he rated 1/10. States his blood pressure has been running normal but he does not have any readings to report. He states he has also been having some back pain and he has taken some Mobic for that. He went to see his PCP today who called [place] requesting that he be seen in the office next week.  Patient is to see Richardson Dopp, PA-C on Monday.  Advised to go to the ER if chest pain returns. Patient verbalized understanding.

## 2020-04-05 NOTE — Telephone Encounter (Signed)
Agree with plan 

## 2020-04-07 NOTE — Progress Notes (Signed)
Cardiology Office Note:    Date:  04/08/2020   ID:  Donald Jennings, DOB 06/02/1956, MRN [id number]  PCP:  Leanna Battles, MD  Cardiologist:  Fransico Him, MD   Electrophysiologist:  None   Referring MD: Leanna Battles, MD   Chief Complaint:  Chest Pain    Patient Profile:    Donald Jennings is a 64 y.o. male with:   Coronary artery disease w/ angina   S/p DES to mLAD and DES to dRCA in 07/2017  Myoview 7/19: apical infarct with peri-infarct ischemia  Cath 7/19: stents patent; mild-mod non-obs CAD elsewhere    Hypertension   Hyperlipidemia   Tobacco use   Palpitations   beta-blocker Rx   Prior CV studies: Carotid US 09/09/18 No significant ICA stenosis  Cardiac catheterization 06/14/18 LAD prox stent patent RCA prox 20, mid 51, dist stent patent; RPAV 40 EF > 65  Myoview 06/13/18 EF 59, inf ST depression at 4' of exercise; apical ant, apical septal and apical defect c/w prior MI with peri-infarct ischemia; Intermediate Risk  Echocardiogram 06/22/16 EF 60-65, Gr 1 DD  History of Present Illness:    Donald Jennings was last seen by Ermalinda Barrios, PA-C in January 2021 via telemedicine.  Of note, he had been placed on isosorbide for jaw discomfort.  There was a question of whether or not this could be angina versus esophageal spasm.  He did have improved symptoms on isosorbide.  He called in last week with symptoms of chest pain and was scheduled for evaluation.     He is here alone today.  He has had symptoms of chest discomfort for a couple of years.  It is mainly a substernal burning sensation.  He feels it in his epigastrium as well.  He has not really had exertional symptoms.  He sometimes notices symptoms with meals.  He can make his symptoms improve with drinking hot coffee.  He has been short of breath with activity.  He does note some discomfort with taking a deep breath.  He has not had orthopnea, paroxysmal nocturnal dyspnea or leg swelling.  He has not had syncope.  His  symptoms are not like his previous angina.  He does note some chronic back pain as well.  He feels that some of his chest pain is related to his back pain.  He continues to smoke.  He has a chronic cough.  He notes his PCP recently did a chest x-ray which demonstrates COPD.  He was placed on an inhaler to use daily.  Past Medical History:  Diagnosis Date  . Abnormal nuclear stress test 06/13/2018  . Abnormal thallium stress test 05/20/2016  . Amaurosis fugax 06/03/2016  . CAD (coronary artery disease), native coronary artery 07/27/2017   < 40% prox LAD, 50% mid LAD and 40% distal RCA  . Carotid artery stenosis 07/27/2017   1-39% bilateral  . CERVICAL RADICULOPATHY 07/02/2010   Qualifier: Diagnosis of  By: Ronnald Ramp MD, Arvid Right.   . Coronary artery disease due to lipid rich plaque   . Coronary artery disease with exertional angina ([place]) 08/04/2017  . DEPRESSION 03/21/2009   Qualifier: Diagnosis of  By: Ronnald Ramp MD, Arvid Right.   . Diabetes mellitus without complication ([place])   . Dysphagia 05/18/2016  . Eczema, dyshidrotic 05/27/2015  . ELECTROCARDIOGRAM, ABNORMAL 03/21/2009  . Essential hypertension 06/19/2009       . GERD 06/19/2009   Qualifier: Diagnosis of  By: Ronnald Ramp MD, Arvid Right.   [name] HLD (  hyperlipidemia)   . Hx of cardiovascular stress test    ETT-Myoview (03/2014):  No ischemia, EF 56%; Low Risk  . Hyperlipidemia LDL goal <70 06/03/2016  . Lytic bone lesions on xray 02/22/2017  . Neuropathy of both feet 05/28/2015  . Pure hypercholesterolemia   . Routine general medical examination at a health care facility 04/04/2013  . SHOULDER INJURY, RIGHT 07/02/2010   Qualifier: Diagnosis of  By: Ronnald Ramp MD, Arvid Right.   . SOB (shortness of breath) 06/10/2016  . Solitary plasmacytoma of bone ([place]) 03/05/2017   per pt/was not cancer  . TOBACCO USE 03/21/2009   Qualifier: Diagnosis of  By: Ronnald Ramp MD, Arvid Right.   . Type 2 diabetes mellitus with manifestations ([place]) 03/27/2015   not started the medication yet!  [name] Unstable  angina (Normal)   . Vertebral artery stenosis, asymptomatic 06/22/2016  . Vitamin D deficiency 04/04/2013    Current Medications: Current Meds  Medication Sig  . aspirin EC 81 MG tablet Take 81 mg by mouth at bedtime.  . clopidogrel (PLAVIX) 75 MG tablet Take 1 tablet (75 mg total) by mouth daily.  . diclofenac sodium (VOLTAREN) 1 % GEL Apply 1 application topically as needed for pain.  . isosorbide mononitrate (IMDUR) 30 MG 24 hr tablet Take 0.5 tablets (15 mg total) by mouth daily.  [name] lidocaine (LIDODERM) 5 % Place 1 patch onto the skin daily as needed (pain). Remove & Discard patch within 12 hours or as directed by MD   . losartan (COZAAR) 100 MG tablet Take 1 tablet (100 mg total) by mouth daily.  . metFORMIN (GLUCOPHAGE-XR) 500 MG 24 hr tablet Take 500 mg by mouth daily with breakfast.  . metoprolol succinate (TOPROL-XL) 25 MG 24 hr tablet TAKE 2 TABLETS (50 MG TOTAL) BY MOUTH DAILY.  . Multiple Vitamins-Minerals (MULTIVITAMIN ADULT) TABS Take 1 tablet by mouth at bedtime.   . nitroGLYCERIN (NITROSTAT) 0.4 MG SL tablet Place 1 tablet (0.4 mg total) under the tongue every 5 (five) minutes as needed.  . pantoprazole (PROTONIX) 40 MG tablet Take 1 tablet (40 mg total) by mouth 2 (two) times daily.  . rosuvastatin (CRESTOR) 40 MG tablet Take 1 tablet (40 mg total) by mouth daily.  [name] tiotropium (SPIRIVA) 18 MCG inhalation capsule Place 18 mcg into inhaler and inhale daily.  . Vitamin D, Ergocalciferol, (DRISDOL) 50000 units CAPS capsule TAKE 1 CAPSULE (50,000 UNITS TOTAL) BY MOUTH ONCE A WEEK.  . [DISCONTINUED] pantoprazole (PROTONIX) 40 MG tablet Take 1 tablet (40 mg total) by mouth daily.     Allergies:   Pork-derived products   Social History   Tobacco Use  . Smoking status: Current Every Day Smoker    Packs/day: 0.05    Years: 38.50    Pack years: 1.92    Types: Cigarettes  . Smokeless tobacco: Never Used  . Tobacco comment: Currently  smokes 1/2-1pk ppd and is considering Chantix    Substance Use Topics  . Alcohol use: No  . Drug use: No     Family Hx: [name] family history includes Arthritis in his mother; CAD in his brother and brother; Diabetes in his mother; Gallstones in his brother; Heart disease in his brother, brother, and father; Hypertension in his father; Kidney Stones in his brother; Stroke in his brother and father. There is no history of Early death, Hyperlipidemia, Kidney disease, or Cancer.  ROS   EKGs/Labs/Other Test Reviewed:    EKG:  EKG is   ordered today.  The ekg ordered today demonstrates normal sinus rhythm, heart rate 63, normal axis, J-point elevation V1-V2, QTC 390, similar to old EKGs  Recent Labs: 10/31/2019: ALT 16; BUN 17; Creatinine, Ser 0.91; Potassium 4.5; Sodium 141   Recent Lipid Panel Lab Results  Component Value Date/Time   CHOL 101 10/31/2019 01:31 PM   TRIG 98 10/31/2019 01:31 PM   HDL 36 (L) 10/31/2019 01:31 PM   CHOLHDL 2.8 10/31/2019 01:31 PM   CHOLHDL 3.0 06/14/2018 03:34 AM   LDLCALC 46 10/31/2019 01:31 PM   LDLDIRECT 167.4 03/21/2009 10:30 AM    Physical Exam:    VS:  BP 122/64   Pulse 75   Ht 5\' 8"  (1.727 m)   Wt 160 lb 1.9 oz (72.6 kg)   SpO2 98%   BMI 24.35 kg/m     Wt Readings from Last 3 Encounters:  04/08/20 160 lb 1.9 oz (72.6 kg)  12/05/19 169 lb (76.7 kg)  10/31/19 168 lb 12.8 oz (76.6 kg)     Constitutional:      Appearance: Healthy appearance. Not in distress.  Neck:     Thyroid: No thyromegaly.     Vascular: JVD normal.  Pulmonary:     Breath sounds: No wheezing. No rales.  Chest:     Chest wall: Tender to palpatation.  Cardiovascular:     Normal rate. Regular rhythm. Normal S1. Normal S2.     Murmurs: There is no murmur.  Edema:    Peripheral edema absent.  Abdominal:     Tenderness: There is abdominal tenderness in the epigastric area.  Skin:    General: Skin is warm and dry.  Neurological:     General: No focal deficit present.     Mental Status: Alert and  oriented to person, place and time.      ASSESSMENT & PLAN:    1. Precordial chest pain The patient has history of chest discomfort over the past couple of years.  It seems to have been more intense in the last several weeks.  He describes it as a substernal burning.  It does not seem to be related to exertion.  He has some back discomfort that seems to radiate to his chest as well as chest discomfort that occurs with meals.  He has some relief with drinking hot liquids.  He has not really had any recurrence of his typical angina.  He has had improved symptoms in the past on nitrate therapy.  His PCP recently asked him to take his isosorbide on a daily basis.  He was only taking it as needed.  He also had a chest x-ray that demonstrated COPD/emphysema, per his report.  I do not have a copy of the chest x-ray report.  I suspect his chest symptoms are mainly related to a gastrointestinal etiology.  He has seen gastroenterology in the past and has undergone prior dilation.  He does note some improvement with Maalox for his current symptoms.  He is already on Protonix.  His last stress test was 2 years ago.  I have recommended that we repeat his Myoview to rule out the possibility of ischemia.  I have also recommended increasing his proton pump inhibitor to twice daily and referring him back to gastroenterology.  -Obtain Lexiscan Myoview  -Increase Protonix to 40 mg twice daily  -Refer to gastroenterology (Dr. Carlean Purl)  -Follow-up with Dr. Radford Pax in 3 to 4 weeks  2. Coronary artery disease involving native coronary artery of native heart with angina  pectoris ([place]) History of DES to the LAD and DES to the RCA in September 2018.  Stents were patent by cardiac catheterization 2019.  As noted, he has fairly chronic chest symptoms that have worsened recently.  I suspect his symptoms are mainly related to a gastrointestinal etiology.  However, we will proceed with a Myoview to rule out the possibility of  ischemia.  Continue current dose of aspirin, clopidogrel, isosorbide, metoprolol succinate and rosuvastatin.  3. Gastroesophageal reflux disease without esophagitis As noted, I have asked him to increase his Protonix to 40 mg twice daily.  I will refer him back to gastroenterology for further evaluation.  4. Essential hypertension [name] blood pressure is controlled on his current regimen.  Continue current therapy.   5. Hyperlipidemia LDL goal <70 Continue high intensity statin therapy.  6. Tobacco abuse He is trying to quit.    Dispo:  Return in about 4 weeks (around 05/06/2020) for Follow up after testing with Dr. Radford Pax, or PA/NP, in person.   Medication Adjustments/Labs and Tests Ordered: Current medicines are reviewed at length with the patient today.  Concerns regarding medicines are outlined above.  Tests Ordered: Orders Placed This Encounter  Procedures  . Ambulatory referral to Gastroenterology  . MYOCARDIAL PERFUSION IMAGING  . EKG 12-Lead   Medication Changes: Meds ordered this encounter  Medications  . pantoprazole (PROTONIX) 40 MG tablet    Sig: Take 1 tablet (40 mg total) by mouth 2 (two) times daily.    Dispense:  60 tablet    Refill:  0    Signed, Richardson Dopp, PA-C  04/08/2020 5:13 PM    [facility] [address], [place], [place]  [address] Phone: [phone]; Fax: [phone]

## 2020-04-08 ENCOUNTER — Ambulatory Visit: Payer: Federal, State, Local not specified - PPO | Admitting: Physician Assistant

## 2020-04-08 ENCOUNTER — Other Ambulatory Visit: Payer: Self-pay

## 2020-04-08 ENCOUNTER — Encounter: Payer: Self-pay | Admitting: Physician Assistant

## 2020-04-08 VITALS — BP 122/64 | HR 75 | Ht 68.0 in | Wt 160.1 lb

## 2020-04-08 DIAGNOSIS — I25119 Atherosclerotic heart disease of native coronary artery with unspecified angina pectoris: Secondary | ICD-10-CM | POA: Diagnosis not present

## 2020-04-08 DIAGNOSIS — I10 Essential (primary) hypertension: Secondary | ICD-10-CM

## 2020-04-08 DIAGNOSIS — K219 Gastro-esophageal reflux disease without esophagitis: Secondary | ICD-10-CM

## 2020-04-08 DIAGNOSIS — R072 Precordial pain: Secondary | ICD-10-CM

## 2020-04-08 DIAGNOSIS — Z720 Tobacco use: Secondary | ICD-10-CM | POA: Diagnosis not present

## 2020-04-08 DIAGNOSIS — E785 Hyperlipidemia, unspecified: Secondary | ICD-10-CM

## 2020-04-08 MED ORDER — PANTOPRAZOLE SODIUM 40 MG PO TBEC: 40.0000 mg | DELAYED_RELEASE_TABLET | Freq: Two times a day (BID) | ORAL | 0 refills | Status: DC

## 2020-04-08 NOTE — Patient Instructions (Addendum)
Medication Instructions:   Your physician has recommended you make the following change in your medication:   1) Increase Protonix to 40 mg, 1 tablet by mouth twice a day  *If you need a refill on your cardiac medications before your next appointment, please call your pharmacy*  Lab Work:  None ordered today  Testing/Procedures:  Your physician has requested that you have a lexiscan myoview. For further information please visit [email or web address]. Please follow instruction sheet, as given.  Follow-Up: At [facility], you and your health needs are our priority.  As part of our continuing mission to provide you with exceptional heart care, we have created designated Provider Care Teams.  These Care Teams include your primary Cardiologist (physician) and Advanced Practice Providers (APPs -  Physician Assistants and Nurse Practitioners) who all work together to provide you with the care you need, when you need it.  We recommend signing up for the patient portal called "MyChart".  Sign up information is provided on this After Visit Summary.  MyChart is used to connect with patients for Virtual Visits (Telemedicine).  Patients are able to view lab/test results, encounter notes, upcoming appointments, etc.  Non-urgent messages can be sent to your provider as well.   To learn more about what you can do with MyChart, go to [email or web address].    Your next appointment:    On 04/30/20 at 10:00AM with Fransico Him, MD

## 2020-04-11 ENCOUNTER — Telehealth (HOSPITAL_COMMUNITY): Payer: Self-pay | Admitting: *Deleted

## 2020-04-11 NOTE — Telephone Encounter (Signed)
Patient given detailed instructions per Myocardial Perfusion Study Information Sheet for the test on 04/15/20. Patient notified to arrive 15 minutes early and that it is imperative to arrive on time for appointment to keep from having the test rescheduled.  If you need to cancel or reschedule your appointment, please call the office within 24 hours of your appointment. . Patient verbalized understanding. Kirstie Peri

## 2020-04-15 ENCOUNTER — Ambulatory Visit (HOSPITAL_COMMUNITY): Payer: Federal, State, Local not specified - PPO | Attending: Cardiovascular Disease

## 2020-04-15 ENCOUNTER — Other Ambulatory Visit: Payer: Self-pay

## 2020-04-15 DIAGNOSIS — I25119 Atherosclerotic heart disease of native coronary artery with unspecified angina pectoris: Secondary | ICD-10-CM

## 2020-04-15 LAB — MYOCARDIAL PERFUSION IMAGING
LV dias vol: 77 mL (ref 62–150)
LV sys vol: 26 mL
Peak HR: 89 {beats}/min
Rest HR: 65 {beats}/min
SDS: 1
SRS: 1
SSS: 2
TID: 1.01

## 2020-04-15 MED ORDER — REGADENOSON 0.4 MG/5ML IV SOLN
0.4000 mg | Freq: Once | INTRAVENOUS | Status: AC
  Administered 2020-04-15: 0.4 mg via INTRAVENOUS

## 2020-04-15 MED ORDER — TECHNETIUM TC 99M TETROFOSMIN IV KIT
31.8000 | PACK | Freq: Once | INTRAVENOUS | Status: AC | PRN
  Administered 2020-04-15: 31.8 via INTRAVENOUS
  Filled 2020-04-15: qty 32

## 2020-04-15 MED ORDER — TECHNETIUM TC 99M TETROFOSMIN IV KIT
10.1000 | PACK | Freq: Once | INTRAVENOUS | Status: AC | PRN
  Administered 2020-04-15: 10.1 via INTRAVENOUS
  Filled 2020-04-15: qty 11

## 2020-04-16 ENCOUNTER — Encounter: Payer: Self-pay | Admitting: Physician Assistant

## 2020-04-18 ENCOUNTER — Encounter: Payer: Self-pay | Admitting: Internal Medicine

## 2020-04-18 ENCOUNTER — Ambulatory Visit: Payer: Federal, State, Local not specified - PPO | Admitting: Internal Medicine

## 2020-04-18 ENCOUNTER — Ambulatory Visit: Payer: Federal, State, Local not specified - PPO | Admitting: Cardiology

## 2020-04-18 VITALS — BP 114/60 | HR 85 | Ht 68.0 in | Wt 158.6 lb

## 2020-04-18 DIAGNOSIS — R0789 Other chest pain: Secondary | ICD-10-CM | POA: Diagnosis not present

## 2020-04-18 DIAGNOSIS — K222 Esophageal obstruction: Secondary | ICD-10-CM

## 2020-04-18 DIAGNOSIS — R1310 Dysphagia, unspecified: Secondary | ICD-10-CM

## 2020-04-18 DIAGNOSIS — R1319 Other dysphagia: Secondary | ICD-10-CM

## 2020-04-18 MED ORDER — PANTOPRAZOLE SODIUM 40 MG PO TBEC: 40.0000 mg | DELAYED_RELEASE_TABLET | Freq: Every day | ORAL | 3 refills | Status: AC

## 2020-04-18 NOTE — Progress Notes (Signed)
Donald Jennings 64 y.o. 1956/09/02 [id number]  Assessment & Plan:   Encounter Diagnoses  Name Primary?  . Esophageal dysphagia Yes  . Esophagogastric junction outflow obstruction   . Atypical chest pain    Multiple possibilities I doubt GERD Do ? If he may have had evolution of EG outflow obstruction to achalasia and will repeat esophageal manometry  Qd PPI Rx Pantoprazole  Cc:.Leanna Battles, MD    Subjective:   Chief Complaint: GERD f/u  HPI  Has hx GERD sxs and EG outflow obstruction and dysphagia - 2019 manomnetry  Increase in epigastric and chest burning. Donald Jennings advised to go to bid pantoprazole but insurance denied. Saw cardiology but thought was not cardiac pain.  Still has some dysphagia - liquids  ice helps swallow better liq   No coughing w/ swallowing   Is avoidng late eating and has reduced nocturnal sx   [place] needles poking in chest anew issue - he ? 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 Pork-Derived Products     Per pt, not allergic to pork!   Current Meds  Medication Sig  . aspirin EC 81 MG tablet Take 81 mg by mouth at bedtime.  . clopidogrel (PLAVIX) 75 MG tablet Take 1 tablet (75 mg total) by mouth daily.  . diclofenac sodium (VOLTAREN) 1 % GEL Apply 1 application topically as needed for pain.  . isosorbide mononitrate (IMDUR) 30 MG 24 hr tablet Take 0.5 tablets (15 mg total) by mouth daily.  [name] lidocaine (LIDODERM) 5 % Place 1 patch onto the skin daily as needed (pain). Remove & Discard patch within 12 hours or as directed by MD   . losartan (COZAAR) 100 MG tablet Take 1 tablet (100 mg total) by mouth daily.  . metFORMIN (GLUCOPHAGE-XR) 500 MG 24 hr tablet Take 500 mg by mouth daily with breakfast.  . metoprolol succinate (TOPROL-XL) 25 MG 24 hr tablet TAKE 2 TABLETS (50 MG TOTAL) BY  MOUTH DAILY.  . Multiple Vitamins-Minerals (MULTIVITAMIN ADULT) TABS Take 1 tablet by mouth at bedtime.   . nitroGLYCERIN (NITROSTAT) 0.4 MG SL tablet Place 1 tablet (0.4 mg total) under the tongue every 5 (five) minutes as needed.  . rosuvastatin (CRESTOR) 40 MG tablet Take 1 tablet (40 mg total) by mouth daily.  [name] tiotropium (SPIRIVA) 18 MCG inhalation capsule Place 18 mcg into inhaler and inhale daily.  . Vitamin D, Ergocalciferol, (DRISDOL) 50000 units CAPS capsule TAKE 1 CAPSULE (50,000 UNITS TOTAL) BY MOUTH ONCE A WEEK.   Past Medical History:  Diagnosis Date  . Abnormal nuclear stress test 06/13/2018  . Abnormal thallium stress test 05/20/2016  . Amaurosis fugax 06/03/2016  . CAD (coronary artery disease), native coronary artery 07/27/2017   < 40% prox LAD, 50% mid LAD and 40% distal RCA // Myoview 03/2020: EF 66, no ischemia or infarction, Low Risk  . Carotid artery stenosis 07/27/2017   1-39% bilateral  . CERVICAL RADICULOPATHY 07/02/2010   Qualifier: Diagnosis of  By: Ronnald Ramp MD, Arvid Right.   . Coronary artery disease due to lipid rich plaque   . Coronary artery disease with exertional angina ([place]) 08/04/2017  . DEPRESSION 03/21/2009   Qualifier: Diagnosis of  By: Ronnald Ramp MD, Arvid Right.   . Diabetes mellitus without complication ([place])   . Dysphagia 05/18/2016  . Eczema, dyshidrotic  05/27/2015  . ELECTROCARDIOGRAM, ABNORMAL 03/21/2009  . Essential hypertension 06/19/2009       . GERD 06/19/2009   Qualifier: Diagnosis of  By: Ronnald Ramp MD, Arvid Right.   [name] HLD (hyperlipidemia)   . Hx of cardiovascular stress test    ETT-Myoview (03/2014):  No ischemia, EF 56%; Low Risk  . Hyperlipidemia LDL goal <70 06/03/2016  . Lytic bone lesions on xray 02/22/2017  . Neuropathy of both feet 05/28/2015  . Pure hypercholesterolemia   . Routine general medical examination at a health care facility 04/04/2013  . SHOULDER INJURY, RIGHT 07/02/2010   Qualifier: Diagnosis of  By: Ronnald Ramp MD, Arvid Right.   . SOB (shortness of  breath) 06/10/2016  . Solitary plasmacytoma of bone ([place]) 03/05/2017   per pt/was not cancer  . TOBACCO USE 03/21/2009   Qualifier: Diagnosis of  By: Ronnald Ramp MD, Arvid Right.   . Type 2 diabetes mellitus with manifestations ([place]) 03/27/2015   not started the medication yet!  [name] Unstable angina ([place])   . Vertebral artery stenosis, asymptomatic 06/22/2016  . Vitamin D deficiency 04/04/2013   Past Surgical History:  Procedure Laterality Date  . CARDIAC CATHETERIZATION  2018   placement of one stent  . CATARACT EXTRACTION Bilateral    Left 2009 at [facility], Right 2004 in [place]  . COLONOSCOPY  07/25/2015   Dr Carol Ada  . CORONARY STENT INTERVENTION N/A 08/04/2017   Procedure: CORONARY STENT INTERVENTION;  Surgeon: Sherren Mocha, MD;  Location: [facility];  Service: Cardiovascular;  Laterality: N/A;  . ESOPHAGEAL MANOMETRY N/A 11/14/2018   Procedure: ESOPHAGEAL MANOMETRY (EM);  Surgeon: Gatha Mayer, MD;  Location: [facility];  Service: Endoscopy;  Laterality: N/A;  . INGUINAL HERNIA REPAIR Right 1986  . IR FLUORO GUIDED NEEDLE PLC ASPIRATION/INJECTION LOC  03/02/2017  . IR FLUORO GUIDED NEEDLE PLC ASPIRATION/INJECTION LOC  03/02/2017  . IR FLUORO GUIDED NEEDLE PLC ASPIRATION/INJECTION LOC  03/02/2017  . IR RADIOLOGIST EVAL & MGMT  03/01/2017  . LEFT HEART CATH AND CORONARY ANGIOGRAPHY N/A 08/04/2017   Procedure: LEFT HEART CATH AND CORONARY ANGIOGRAPHY;  Surgeon: Sherren Mocha, MD;  Location: [facility];  Service: Cardiovascular;  Laterality: N/A;  . LEFT HEART CATH AND CORONARY ANGIOGRAPHY N/A 06/14/2018   Procedure: LEFT HEART CATH AND CORONARY ANGIOGRAPHY;  Surgeon: Troy Sine, MD;  Location: [facility];  Service: Cardiovascular;  Laterality: N/A;   Social History   Social History Narrative   Married 1 sone born 1998   From [place]   [employer]   2 caffeine drinks daily   Smoker, no alcohol no drugs   family history includes Arthritis in his  mother; CAD in his brother and brother; Diabetes in his mother; Gallstones in his brother; Heart disease in his brother, brother, and father; Hypertension in his father; Kidney Stones in his brother; Stroke in his brother and father.   Review of Systems  As above Objective:   Physical Exam BP 114/60   Pulse 85   Ht 5\' 8"  (1.727 m)   Wt 158 lb 9.6 oz (71.9 kg)   SpO2 97%   BMI 24.12 kg/m  NAD Chest wall nontender  cardilogy note from 03/2020 eviewed

## 2020-04-18 NOTE — Patient Instructions (Addendum)
We have sent the following medications to your pharmacy for you to pick up at your convenience: Pantoprazole  Due to recent COVID-19 restrictions implemented by our local and state authorities and in an effort to keep both patients and staff as safe as possible, our hospital system now requires COVID-19 testing prior to any scheduled hospital procedure. Please go to our [facility] location drive thru testing site ([address] [place], [place] [address]) on 05/31/2020 at  10:30AM. There will be multiple testing areas, the first checkpoint being for pre-procedure/surgery testing. Get into the right (yellow) lane that leads to the PAT testing team. You will not be billed at the time of testing but may receive a bill later depending on your insurance. The approximate cost of the test is $100. You must agree to quarantine from the time of your testing until the procedure date on 06/05/2020 . This should include staying at home with ONLY the people you live with. Avoid take-out, grocery store shopping or leaving the house for any non-emergent reason. Failure to have your COVID-19 test done on the date and time you have been scheduled will result in cancellation of procedure. Please call our office at [phone] if you have any questions.     You have been scheduled for an esophageal manometry test at [facility] on 06/05/2020 at 12:30pm. Please arrive 30 minutes prior to your procedure for registration. You will need to go to outpatient registration (1st floor of the hospital) first. Make certain to bring your insurance cards as well as a complete list of medications.  Please remember the following:  1) Do not take any muscle relaxants, xanax (alprazolam) or ativan for 1 day prior to your test as well as the day of the test.  2) Nothing to eat or drink after 12:00 midnight on the night before your test.  3) Hold all diabetic medications/insulin the morning of the test. You may eat and take  your medications after the test.  It will take at least 2 weeks to receive the results of this test from your physician.  ------------------------------------------ ABOUT ESOPHAGEAL MANOMETRY Esophageal manometry (muh-NOM-uh-tree) is a test that gauges how well your esophagus works. Your esophagus is the long, muscular tube that connects your throat to your stomach. Esophageal manometry measures the rhythmic muscle contractions (peristalsis) that occur in your esophagus when you swallow. Esophageal manometry also measures the coordination and force exerted by the muscles of your esophagus.  During esophageal manometry, a thin, flexible tube (catheter) that contains sensors is passed through your nose, down your esophagus and into your stomach. Esophageal manometry can be helpful in diagnosing some mostly uncommon disorders that affect your esophagus.  Why it's done Esophageal manometry is used to evaluate the movement (motility) of food through the esophagus and into the stomach. The test measures how well the circular bands of muscle (sphincters) at the top and bottom of your esophagus open and close, as well as the pressure, strength and pattern of the wave of esophageal muscle contractions that moves food along.  What you can expect Esophageal manometry is an outpatient procedure done without sedation. Most people tolerate it well. You may be asked to change into a hospital gown before the test starts.  During esophageal manometry  . While you are sitting up, a member of your health care team sprays your throat with a numbing medication or puts numbing gel in your nose or both.  . A catheter is guided through  your nose into your esophagus. The catheter may be sheathed in a water-filled sleeve. It doesn't interfere with your breathing. However, your eyes may water, and you may gag. You may have a slight nosebleed from irritation.  . After the catheter is in place, you may be asked to lie on your  back on an exam table, or you may be asked to remain seated.  . You then swallow small sips of water. As you do, a computer connected to the catheter records the pressure, strength and pattern of your esophageal muscle contractions.  . During the test, you'll be asked to breathe slowly and smoothly, remain as still as possible, and swallow only when you're asked to do so.  . A member of your health care team may move the catheter down into your stomach while the catheter continues its measurements.  . The catheter then is slowly withdrawn. The test usually lasts 20 to 30 minutes.  After esophageal manometry  When your esophageal manometry is complete, you may return to your normal activities  This test typically takes 30-45 minutes to complete.   Stay on all your medicines per Dr Carlean Purl. ________________________________________________________________________________ I appreciate the opportunity to care for you. Silvano Rusk, MD, [facility]

## 2020-04-30 ENCOUNTER — Ambulatory Visit: Payer: Federal, State, Local not specified - PPO | Admitting: Cardiology

## 2020-04-30 ENCOUNTER — Telehealth: Payer: Self-pay

## 2020-04-30 NOTE — Telephone Encounter (Signed)
Attempted to call patient due to no show for appt with Dr. Radford Pax today. Unable to leave voicemail.

## 2020-05-31 ENCOUNTER — Inpatient Hospital Stay (HOSPITAL_COMMUNITY): Admission: RE | Admit: 2020-05-31 | Payer: Federal, State, Local not specified - PPO | Source: Ambulatory Visit

## 2020-06-04 ENCOUNTER — Other Ambulatory Visit (HOSPITAL_COMMUNITY)
Admission: RE | Admit: 2020-06-04 | Discharge: 2020-06-04 | Disposition: A | Discharge status: Home / Self Care | Payer: Federal, State, Local not specified - PPO | Source: Ambulatory Visit | Attending: Internal Medicine | Admitting: Internal Medicine

## 2020-06-04 DIAGNOSIS — Z20822 Contact with and (suspected) exposure to covid-19: Secondary | ICD-10-CM | POA: Diagnosis not present

## 2020-06-04 DIAGNOSIS — Z01812 Encounter for preprocedural laboratory examination: Secondary | ICD-10-CM | POA: Insufficient documentation

## 2020-06-04 LAB — SARS CORONAVIRUS 2 (TAT 6-24 HRS): SARS Coronavirus 2: NEGATIVE

## 2020-06-04 NOTE — Progress Notes (Signed)
Precall complete. Pt instructed that he needs to go [facility] to obtain COVID testing before 3:30 for his procedure tomorrow. Pt verbalized understanding.

## 2020-06-05 ENCOUNTER — Ambulatory Visit (HOSPITAL_COMMUNITY)
Admission: RE | Admit: 2020-06-05 | Discharge: 2020-06-05 | Disposition: A | Discharge status: Home / Self Care | Payer: Federal, State, Local not specified - PPO | Attending: Internal Medicine | Admitting: Internal Medicine

## 2020-06-05 ENCOUNTER — Encounter (HOSPITAL_COMMUNITY)
Admission: RE | Disposition: A | Discharge status: Home / Self Care | Payer: Self-pay | Source: Home / Self Care | Attending: Internal Medicine

## 2020-06-05 DIAGNOSIS — R1310 Dysphagia, unspecified: Secondary | ICD-10-CM | POA: Diagnosis not present

## 2020-06-05 HISTORY — PX: ESOPHAGEAL MANOMETRY: SHX5429

## 2020-06-05 SURGERY — MANOMETRY, ESOPHAGUS

## 2020-06-05 MED ORDER — LIDOCAINE VISCOUS HCL 2 % MT SOLN
OROMUCOSAL | Status: AC
  Filled 2020-06-05: qty 15

## 2020-06-05 SURGICAL SUPPLY — 2 items
FACESHIELD LNG OPTICON STERILE (SAFETY) IMPLANT
GLOVE BIO SURGEON STRL SZ8 (GLOVE) ×4 IMPLANT

## 2020-06-05 NOTE — Progress Notes (Signed)
Esophageal manometry performed per protocol.  Patient tolerated well.  Report to be sent to Dr. Kavitha Nandigam 

## 2020-06-07 ENCOUNTER — Encounter (HOSPITAL_COMMUNITY): Payer: Self-pay | Admitting: Internal Medicine

## 2020-06-17 ENCOUNTER — Ambulatory Visit: Payer: Federal, State, Local not specified - PPO | Admitting: Cardiology

## 2020-06-27 DIAGNOSIS — E7849 Other hyperlipidemia: Secondary | ICD-10-CM | POA: Diagnosis not present

## 2020-06-27 DIAGNOSIS — E119 Type 2 diabetes mellitus without complications: Secondary | ICD-10-CM | POA: Diagnosis not present

## 2020-06-27 DIAGNOSIS — F17210 Nicotine dependence, cigarettes, uncomplicated: Secondary | ICD-10-CM | POA: Diagnosis not present

## 2020-06-27 DIAGNOSIS — K219 Gastro-esophageal reflux disease without esophagitis: Secondary | ICD-10-CM | POA: Diagnosis not present

## 2020-08-14 ENCOUNTER — Ambulatory Visit: Payer: Federal, State, Local not specified - PPO | Admitting: Internal Medicine

## 2020-09-10 ENCOUNTER — Ambulatory Visit (INDEPENDENT_AMBULATORY_CARE_PROVIDER_SITE_OTHER)
Admission: RE | Admit: 2020-09-10 | Discharge: 2020-09-10 | Disposition: A | Discharge status: Home / Self Care | Payer: Federal, State, Local not specified - PPO | Source: Ambulatory Visit | Attending: Acute Care | Admitting: Acute Care

## 2020-09-10 ENCOUNTER — Other Ambulatory Visit: Payer: Self-pay

## 2020-09-10 DIAGNOSIS — F17210 Nicotine dependence, cigarettes, uncomplicated: Secondary | ICD-10-CM

## 2020-09-10 DIAGNOSIS — Z122 Encounter for screening for malignant neoplasm of respiratory organs: Secondary | ICD-10-CM

## 2020-09-12 NOTE — Progress Notes (Signed)
Please call patient and let them  know their  low dose Ct was read as a Lung RADS 2: nodules that are benign in appearance and behavior with a very low likelihood of becoming a clinically active cancer due to size or lack of growth. Recommendation per radiology is for a repeat LDCT in 12 months. .Please let them  know we will order and schedule their  annual screening scan for 08/2021. Please let them  know there was notation of CAD on their  scan.  Please remind the patient  that this is a non-gated exam therefore degree or severity of disease  cannot be determined. Please have them  follow up with their PCP regarding potential risk factor modification, dietary therapy or pharmacologic therapy if clinically indicated. Pt.  is  currently on statin therapy. Please place order for annual  screening scan for  08/2021 and fax results to PCP. Thanks so much. 

## 2020-09-18 ENCOUNTER — Other Ambulatory Visit: Payer: Self-pay | Admitting: Cardiology

## 2020-09-18 DIAGNOSIS — I10 Essential (primary) hypertension: Secondary | ICD-10-CM

## 2020-09-18 DIAGNOSIS — E118 Type 2 diabetes mellitus with unspecified complications: Secondary | ICD-10-CM

## 2020-09-23 ENCOUNTER — Other Ambulatory Visit: Payer: Self-pay | Admitting: *Deleted

## 2020-09-23 DIAGNOSIS — F17210 Nicotine dependence, cigarettes, uncomplicated: Secondary | ICD-10-CM

## 2020-09-25 DIAGNOSIS — M542 Cervicalgia: Secondary | ICD-10-CM | POA: Diagnosis not present

## 2020-09-25 DIAGNOSIS — M25511 Pain in right shoulder: Secondary | ICD-10-CM | POA: Diagnosis not present

## 2020-10-01 DIAGNOSIS — M5412 Radiculopathy, cervical region: Secondary | ICD-10-CM | POA: Diagnosis not present

## 2020-10-01 DIAGNOSIS — M542 Cervicalgia: Secondary | ICD-10-CM | POA: Diagnosis not present

## 2020-10-10 DIAGNOSIS — M5412 Radiculopathy, cervical region: Secondary | ICD-10-CM | POA: Diagnosis not present

## 2020-10-10 DIAGNOSIS — M542 Cervicalgia: Secondary | ICD-10-CM | POA: Diagnosis not present

## 2020-10-12 ENCOUNTER — Other Ambulatory Visit: Payer: Self-pay | Admitting: Cardiology

## 2020-10-15 DIAGNOSIS — M542 Cervicalgia: Secondary | ICD-10-CM | POA: Diagnosis not present

## 2020-10-15 DIAGNOSIS — M5412 Radiculopathy, cervical region: Secondary | ICD-10-CM | POA: Diagnosis not present

## 2020-10-22 DIAGNOSIS — M5412 Radiculopathy, cervical region: Secondary | ICD-10-CM | POA: Diagnosis not present

## 2020-10-22 DIAGNOSIS — M542 Cervicalgia: Secondary | ICD-10-CM | POA: Diagnosis not present

## 2020-10-29 ENCOUNTER — Other Ambulatory Visit: Payer: Self-pay | Admitting: Cardiology

## 2020-10-29 DIAGNOSIS — I10 Essential (primary) hypertension: Secondary | ICD-10-CM

## 2020-10-29 DIAGNOSIS — E118 Type 2 diabetes mellitus with unspecified complications: Secondary | ICD-10-CM

## 2020-10-31 DIAGNOSIS — M5412 Radiculopathy, cervical region: Secondary | ICD-10-CM | POA: Diagnosis not present

## 2020-10-31 DIAGNOSIS — M542 Cervicalgia: Secondary | ICD-10-CM | POA: Diagnosis not present

## 2020-11-06 DIAGNOSIS — M5412 Radiculopathy, cervical region: Secondary | ICD-10-CM | POA: Diagnosis not present

## 2020-11-11 DIAGNOSIS — M5412 Radiculopathy, cervical region: Secondary | ICD-10-CM | POA: Diagnosis not present

## 2020-11-25 ENCOUNTER — Other Ambulatory Visit: Payer: Self-pay | Admitting: Cardiology

## 2020-12-20 DIAGNOSIS — Z125 Encounter for screening for malignant neoplasm of prostate: Secondary | ICD-10-CM | POA: Diagnosis not present

## 2020-12-20 DIAGNOSIS — E785 Hyperlipidemia, unspecified: Secondary | ICD-10-CM | POA: Diagnosis not present

## 2020-12-20 DIAGNOSIS — E119 Type 2 diabetes mellitus without complications: Secondary | ICD-10-CM | POA: Diagnosis not present

## 2020-12-20 DIAGNOSIS — E559 Vitamin D deficiency, unspecified: Secondary | ICD-10-CM | POA: Diagnosis not present

## 2020-12-25 ENCOUNTER — Other Ambulatory Visit: Payer: Self-pay | Admitting: Cardiology

## 2020-12-27 DIAGNOSIS — R82998 Other abnormal findings in urine: Secondary | ICD-10-CM | POA: Diagnosis not present

## 2021-01-01 ENCOUNTER — Other Ambulatory Visit: Payer: Self-pay | Admitting: Cardiology

## 2021-01-01 DIAGNOSIS — Z1212 Encounter for screening for malignant neoplasm of rectum: Secondary | ICD-10-CM | POA: Diagnosis not present

## 2021-01-01 DIAGNOSIS — I10 Essential (primary) hypertension: Secondary | ICD-10-CM

## 2021-03-01 ENCOUNTER — Other Ambulatory Visit: Payer: Self-pay | Admitting: Cardiology

## 2021-03-01 DIAGNOSIS — I10 Essential (primary) hypertension: Secondary | ICD-10-CM

## 2021-03-01 DIAGNOSIS — E118 Type 2 diabetes mellitus with unspecified complications: Secondary | ICD-10-CM

## 2021-04-09 ENCOUNTER — Other Ambulatory Visit: Payer: Self-pay | Admitting: Cardiology

## 2021-04-10 DIAGNOSIS — B027 Disseminated zoster: Secondary | ICD-10-CM | POA: Diagnosis not present

## 2021-05-01 ENCOUNTER — Other Ambulatory Visit: Payer: Self-pay | Admitting: Cardiology

## 2021-05-12 ENCOUNTER — Other Ambulatory Visit: Payer: Self-pay | Admitting: Cardiology

## 2021-05-12 DIAGNOSIS — I10 Essential (primary) hypertension: Secondary | ICD-10-CM

## 2021-05-16 ENCOUNTER — Other Ambulatory Visit: Payer: Self-pay | Admitting: Cardiology

## 2021-05-16 DIAGNOSIS — E118 Type 2 diabetes mellitus with unspecified complications: Secondary | ICD-10-CM

## 2021-05-16 DIAGNOSIS — I10 Essential (primary) hypertension: Secondary | ICD-10-CM

## 2021-05-21 DIAGNOSIS — H40023 Open angle with borderline findings, high risk, bilateral: Secondary | ICD-10-CM | POA: Diagnosis not present

## 2021-05-21 DIAGNOSIS — E119 Type 2 diabetes mellitus without complications: Secondary | ICD-10-CM | POA: Diagnosis not present

## 2021-05-29 ENCOUNTER — Other Ambulatory Visit: Payer: Self-pay | Admitting: Cardiology

## 2021-06-04 ENCOUNTER — Other Ambulatory Visit: Payer: Self-pay | Admitting: Cardiology

## 2021-06-14 ENCOUNTER — Other Ambulatory Visit: Payer: Self-pay | Admitting: Cardiology

## 2021-06-14 DIAGNOSIS — I10 Essential (primary) hypertension: Secondary | ICD-10-CM

## 2021-06-14 DIAGNOSIS — E118 Type 2 diabetes mellitus with unspecified complications: Secondary | ICD-10-CM

## 2021-06-17 ENCOUNTER — Other Ambulatory Visit: Payer: Self-pay | Admitting: Cardiology

## 2021-06-24 ENCOUNTER — Other Ambulatory Visit: Payer: Self-pay | Admitting: Cardiology

## 2021-06-24 DIAGNOSIS — I10 Essential (primary) hypertension: Secondary | ICD-10-CM

## 2021-06-24 DIAGNOSIS — E118 Type 2 diabetes mellitus with unspecified complications: Secondary | ICD-10-CM

## 2021-07-02 ENCOUNTER — Telehealth: Payer: Self-pay | Admitting: Cardiology

## 2021-07-02 MED ORDER — ISOSORBIDE MONONITRATE ER 30 MG PO TB24: 15.0000 mg | ORAL_TABLET | Freq: Every day | ORAL | 1 refills | Status: DC

## 2021-07-02 NOTE — Telephone Encounter (Signed)
  *  STAT* If patient is at the pharmacy, call can be transferred to refill team.   1. Which medications need to be refilled? (please list name of each medication and dose if known) isosorbide mononitrate (IMDUR) 30 MG 24 hr tablet  2. Which pharmacy/location (including street and city if local pharmacy) is medication to be sent to? [id number] [place] [place] [facility] [address]  3. Do they need a 30 day or 90 day supply? 90 days   Pt only 1 pill left need refill today

## 2021-07-02 NOTE — Telephone Encounter (Signed)
Pt scheduled to see Dr. Radford Pax 10/16/21. Ok'd refills for his appointment, [facility] [place]

## 2021-07-09 ENCOUNTER — Other Ambulatory Visit: Payer: Self-pay | Admitting: Cardiology

## 2021-07-14 ENCOUNTER — Other Ambulatory Visit: Payer: Self-pay | Admitting: Cardiology

## 2021-07-14 DIAGNOSIS — I10 Essential (primary) hypertension: Secondary | ICD-10-CM

## 2021-07-31 ENCOUNTER — Other Ambulatory Visit: Payer: Self-pay | Admitting: Internal Medicine

## 2021-07-31 ENCOUNTER — Other Ambulatory Visit: Payer: Self-pay | Admitting: Cardiology

## 2021-07-31 DIAGNOSIS — I10 Essential (primary) hypertension: Secondary | ICD-10-CM

## 2021-07-31 DIAGNOSIS — E118 Type 2 diabetes mellitus with unspecified complications: Secondary | ICD-10-CM

## 2021-10-06 DIAGNOSIS — I10 Essential (primary) hypertension: Secondary | ICD-10-CM | POA: Diagnosis not present

## 2021-10-06 DIAGNOSIS — E119 Type 2 diabetes mellitus without complications: Secondary | ICD-10-CM | POA: Diagnosis not present

## 2021-10-06 DIAGNOSIS — Z23 Encounter for immunization: Secondary | ICD-10-CM | POA: Diagnosis not present

## 2021-10-07 ENCOUNTER — Other Ambulatory Visit: Payer: Self-pay | Admitting: Cardiology

## 2021-10-07 ENCOUNTER — Other Ambulatory Visit: Payer: Self-pay | Admitting: Internal Medicine

## 2021-10-07 DIAGNOSIS — I10 Essential (primary) hypertension: Secondary | ICD-10-CM

## 2021-10-07 DIAGNOSIS — E118 Type 2 diabetes mellitus with unspecified complications: Secondary | ICD-10-CM

## 2021-10-16 ENCOUNTER — Other Ambulatory Visit: Payer: Self-pay

## 2021-10-16 ENCOUNTER — Telehealth: Payer: Self-pay | Admitting: Internal Medicine

## 2021-10-16 ENCOUNTER — Encounter: Payer: Self-pay | Admitting: Cardiology

## 2021-10-16 ENCOUNTER — Ambulatory Visit: Payer: Federal, State, Local not specified - PPO | Admitting: Cardiology

## 2021-10-16 VITALS — BP 128/70 | HR 66 | Ht 68.0 in | Wt 153.4 lb

## 2021-10-16 DIAGNOSIS — I2510 Atherosclerotic heart disease of native coronary artery without angina pectoris: Secondary | ICD-10-CM

## 2021-10-16 DIAGNOSIS — I6523 Occlusion and stenosis of bilateral carotid arteries: Secondary | ICD-10-CM

## 2021-10-16 DIAGNOSIS — I2583 Coronary atherosclerosis due to lipid rich plaque: Secondary | ICD-10-CM | POA: Diagnosis not present

## 2021-10-16 DIAGNOSIS — I10 Essential (primary) hypertension: Secondary | ICD-10-CM

## 2021-10-16 DIAGNOSIS — E785 Hyperlipidemia, unspecified: Secondary | ICD-10-CM | POA: Diagnosis not present

## 2021-10-16 DIAGNOSIS — E118 Type 2 diabetes mellitus with unspecified complications: Secondary | ICD-10-CM

## 2021-10-16 LAB — COMPREHENSIVE METABOLIC PANEL
ALT: 15 IU/L (ref 0–44)
AST: 19 IU/L (ref 0–40)
Albumin/Globulin Ratio: 2.2 (ref 1.2–2.2)
Albumin: 4.8 g/dL (ref 3.8–4.8)
Alkaline Phosphatase: 64 IU/L (ref 44–121)
BUN/Creatinine Ratio: 23 (ref 10–24)
BUN: 20 mg/dL (ref 8–27)
Bilirubin Total: <0.2 mg/dL (ref 0.0–1.2)
CO2: 25 mmol/L (ref 20–29)
Calcium: 9.3 mg/dL (ref 8.6–10.2)
Chloride: 105 mmol/L (ref 96–106)
Creatinine, Ser: 0.87 mg/dL (ref 0.76–1.27)
Globulin, Total: 2.2 g/dL (ref 1.5–4.5)
Glucose: 107 mg/dL — ABNORMAL HIGH (ref 70–99)
Potassium: 4.6 mmol/L (ref 3.5–5.2)
Sodium: 142 mmol/L (ref 134–144)
Total Protein: 7 g/dL (ref 6.0–8.5)
eGFR: 96 mL/min/{1.73_m2} (ref >59)

## 2021-10-16 LAB — LIPID PANEL
Chol/HDL Ratio: 2.6 ratio (ref 0.0–5.0)
Cholesterol, Total: 92 mg/dL — ABNORMAL LOW (ref 100–199)
HDL: 35 mg/dL — ABNORMAL LOW (ref >39)
LDL Chol Calc (NIH): 40 mg/dL (ref 0–99)
Triglycerides: 85 mg/dL (ref 0–149)
VLDL Cholesterol Cal: 17 mg/dL (ref 5–40)

## 2021-10-16 MED ORDER — ROSUVASTATIN CALCIUM 40 MG PO TABS: ORAL_TABLET | ORAL | 3 refills | Status: DC

## 2021-10-16 MED ORDER — NITROGLYCERIN 0.4 MG SL SUBL: 0.4000 mg | SUBLINGUAL_TABLET | SUBLINGUAL | 2 refills | Status: AC | PRN

## 2021-10-16 MED ORDER — ISOSORBIDE MONONITRATE ER 30 MG PO TB24
15.0000 mg | ORAL_TABLET | Freq: Every day | ORAL | 3 refills | Status: DC
Start: 2021-10-16 — End: 2022-12-23

## 2021-10-16 MED ORDER — CLOPIDOGREL BISULFATE 75 MG PO TABS: 75.0000 mg | ORAL_TABLET | Freq: Every day | ORAL | 3 refills | Status: DC

## 2021-10-16 MED ORDER — METOPROLOL SUCCINATE ER 25 MG PO TB24: 50.0000 mg | ORAL_TABLET | Freq: Every day | ORAL | 3 refills | Status: DC

## 2021-10-16 MED ORDER — LOSARTAN POTASSIUM 100 MG PO TABS: ORAL_TABLET | ORAL | 3 refills | Status: DC

## 2021-10-16 MED ORDER — NITROGLYCERIN 0.4 MG SL SUBL: 0.4000 mg | SUBLINGUAL_TABLET | SUBLINGUAL | 2 refills | Status: DC | PRN

## 2021-10-16 NOTE — Progress Notes (Signed)
Cardiology Office Note:    Date:  10/16/2021   ID:  Donald Jennings, DOB September 07, 1956, MRN [id number]  PCP:  Leanna Battles, MD  Cardiologist:  Fransico Him, MD    Referring MD: Leanna Battles, MD   Chief Complaint  Patient presents with   Coronary Artery Disease   Hypertension   Hyperlipidemia     History of Present Illness:    Donald Jennings is a 65 y.o. male with a hx of CAD, HTN, HLD and ASCAD with cath 07/2017 showing total occlusion of the mLAD with left to left collaterals treated with PCI/DES x1, and 75% lesion in the distal RCA treated with PCI/DES x1. Normal LV function and LVEDP. He was placed on  DAPT with ASA/plavix.   He developed recurrent jaw and gum pain in June similar to his angina in the past with abnormal stress test showing 1.5 mm horizontal ST depression and a new apical and anterior apical defects on Myoview imaging.  Repeat  Cath showed no significant changes.   He was seen several times by extenders due to chest pain.  It was felt that it was likely GI in etiology given that he is undergone prior esophageal dilatations and his symptoms would improve with Maalox as well as Protonix.  At last office visit Richardson Dopp, PA referred him back to GI and increased his Protonix to 40 mg twice daily.  Nuclear stress test 04/15/2020 showed no ischemia.  He is here today for followup and is doing well.  He continues to have problems with stomach discomfort.  He says that he wakes up with a sore throat from acid at night in his esophagus.  He says that drinking water feels like it gets stuck in his throat.  He has not followed up with his GI MD.  He denies any anginal chest pain or pressure, SOB, DOE(except mowing the grass), PND, orthopnea, LE edema, dizziness or syncope. He continues to smoke at 1ppd.  He was prescribed Chantix by PCP but has not started it.  He is compliant with his meds and is tolerating meds with no SE.       Past Medical History:  Diagnosis Date   Abnormal  nuclear stress test 06/13/2018   Abnormal thallium stress test 05/20/2016   Amaurosis fugax 06/03/2016   CAD (coronary artery disease), native coronary artery 07/27/2017   < 40% prox LAD, 50% mid LAD and 40% distal RCA // Myoview 03/2020: EF 66, no ischemia or infarction, Low Risk   Carotid artery stenosis 07/27/2017   1-39% bilateral   CERVICAL RADICULOPATHY 07/02/2010   Qualifier: Diagnosis of  By: Ronnald Ramp MD, Arvid Right.    Coronary artery disease due to lipid rich plaque    Coronary artery disease with exertional angina ([place]) 08/04/2017   DEPRESSION 03/21/2009   Qualifier: Diagnosis of  By: Ronnald Ramp MD, Arvid Right.    Diabetes mellitus without complication ([place])    Dysphagia 05/18/2016   Eczema, dyshidrotic 05/27/2015   ELECTROCARDIOGRAM, ABNORMAL 03/21/2009   Essential hypertension 06/19/2009        GERD 06/19/2009   Qualifier: Diagnosis of  By: Ronnald Ramp MD, Arvid Right.    HLD (hyperlipidemia)    Hx of cardiovascular stress test    ETT-Myoview (03/2014):  No ischemia, EF 56%; Low Risk   Hyperlipidemia LDL goal <70 06/03/2016   Lytic bone lesions on xray 02/22/2017   Neuropathy of both feet 05/28/2015   Pure hypercholesterolemia    Routine general medical examination at  a health care facility 04/04/2013   SHOULDER INJURY, RIGHT 07/02/2010   Qualifier: Diagnosis of  By: Ronnald Ramp MD, Arvid Right.    SOB (shortness of breath) 06/10/2016   Solitary plasmacytoma of bone ([place]) 03/05/2017   per pt/was not cancer   TOBACCO USE 03/21/2009   Qualifier: Diagnosis of  By: Ronnald Ramp MD, Arvid Right.    Type 2 diabetes mellitus with manifestations ([place]) 03/27/2015   not started the medication yet!   Unstable angina ([facility])    Vertebral artery stenosis, asymptomatic 06/22/2016   Vitamin D deficiency 04/04/2013    Past Surgical History:  Procedure Laterality Date   CARDIAC CATHETERIZATION  2018   placement of one stent   CATARACT EXTRACTION Bilateral    Left 2009 at [facility], Right 2004 in [place]  07/25/2015   Dr  Carol Ada   CORONARY STENT INTERVENTION N/A 08/04/2017   Procedure: CORONARY STENT INTERVENTION;  Surgeon: Sherren Mocha, MD;  Location: [facility];  Service: Cardiovascular;  Laterality: N/A;   ESOPHAGEAL MANOMETRY N/A 11/14/2018   Procedure: ESOPHAGEAL MANOMETRY (EM);  Surgeon: Gatha Mayer, MD;  Location: [facility];  Service: Endoscopy;  Laterality: N/A;   ESOPHAGEAL MANOMETRY N/A 06/05/2020   Procedure: ESOPHAGEAL MANOMETRY (EM);  Surgeon: Gatha Mayer, MD;  Location: [facility];  Service: Endoscopy;  Laterality: N/A;   INGUINAL HERNIA REPAIR Right 1986   IR FLUORO GUIDED NEEDLE PLC ASPIRATION/INJECTION LOC  03/02/2017   IR FLUORO GUIDED NEEDLE PLC ASPIRATION/INJECTION LOC  03/02/2017   IR FLUORO GUIDED NEEDLE PLC ASPIRATION/INJECTION LOC  03/02/2017   IR RADIOLOGIST EVAL & MGMT  03/01/2017   LEFT HEART CATH AND CORONARY ANGIOGRAPHY N/A 08/04/2017   Procedure: LEFT HEART CATH AND CORONARY ANGIOGRAPHY;  Surgeon: Sherren Mocha, MD;  Location: [facility];  Service: Cardiovascular;  Laterality: N/A;   LEFT HEART CATH AND CORONARY ANGIOGRAPHY N/A 06/14/2018   Procedure: LEFT HEART CATH AND CORONARY ANGIOGRAPHY;  Surgeon: Troy Sine, MD;  Location: [facility];  Service: Cardiovascular;  Laterality: N/A;    Current Medications: Current Meds  Medication Sig   aspirin EC 81 MG tablet Take 81 mg by mouth at bedtime.   clopidogrel (PLAVIX) 75 MG tablet TAKE 1 TABLET BY MOUTH EVERY DAY   diclofenac sodium (VOLTAREN) 1 % GEL Apply 1 application topically as needed for pain.   isosorbide mononitrate (IMDUR) 30 MG 24 hr tablet TAKE 1/2 TABLET BY MOUTH DAILY   lidocaine (LIDODERM) 5 % Place 1 patch onto the skin daily as needed (pain). Remove & Discard patch within 12 hours or as directed by MD    losartan (COZAAR) 100 MG tablet TAKE 1 TABLET BY MOUTH DAILY. PT NEEDS TO KEEP UPCOMING APPT IN NOV FOR FURTHER REFILLS   metFORMIN (GLUCOPHAGE-XR) 500 MG 24 hr tablet Take  500 mg by mouth daily with breakfast.   metoprolol succinate (TOPROL-XL) 25 MG 24 hr tablet TAKE 2 TABLETS BY MOUTH EVERY DAY   Multiple Vitamins-Minerals (MULTIVITAMIN ADULT) TABS Take 1 tablet by mouth at bedtime.    nitroGLYCERIN (NITROSTAT) 0.4 MG SL tablet Place 1 tablet (0.4 mg total) under the tongue every 5 (five) minutes as needed.   pantoprazole (PROTONIX) 40 MG tablet Take 1 tablet (40 mg total) by mouth daily before breakfast.   rosuvastatin (CRESTOR) 40 MG tablet TAKE 1 TABLET BY MOUTH DAILY. PT NEEDS TO KEEP UPCOMING APPT IN NOV FOR FURTHER REFILLS   tiotropium (SPIRIVA) 18 MCG inhalation  capsule Place 18 mcg into inhaler and inhale daily.   varenicline (CHANTIX CONTINUING MONTH PAK) 1 MG tablet Take 1 tablet (1 mg total) by mouth 2 (two) times daily. Take as directed.   varenicline (CHANTIX) 0.5 MG tablet Take 1 tablet (0.5 mg total) by mouth 2 (two) times daily. Take as directed.   Vitamin D, Ergocalciferol, (DRISDOL) 50000 units CAPS capsule TAKE 1 CAPSULE (50,000 UNITS TOTAL) BY MOUTH ONCE A WEEK.     Allergies:   Pork-derived products   Social History   Socioeconomic History   Marital status: Married    Spouse name: Not on file   Number of children: 1   Years of education: Not on file   Highest education level: Not on file  Occupational History   Occupation: Scientist, clinical (histocompatibility and immunogenetics)    Employer: [employer]  Tobacco Use   Smoking status: Every Day    Packs/day: 0.05    Years: 38.50    Pack years: 1.93    Types: Cigarettes   Smokeless tobacco: Never   Tobacco comments:    Currently  smokes 1/2-1pk ppd and is considering Chantix  Vaping Use   Vaping Use: Never used  Substance and Sexual Activity   Alcohol use: No   Drug use: No   Sexual activity: Not on file  Other Topics Concern   Not on file  Social History Narrative   Married 1 sone born 1998   From [place]   Manager [employer]   2 caffeine drinks daily   Smoker, no alcohol no drugs    Social Determinants of Radio broadcast assistant Strain: Not on file  Food Insecurity: Not on file  Transportation Needs: Not on file  Physical Activity: Not on file  Stress: Not on file  Social Connections: Not on file     Family History: [name] family history includes Arthritis in his mother; CAD in his brother and brother; Diabetes in his mother; Gallstones in his brother; Heart disease in his brother, brother, and father; Hypertension in his father; Kidney Stones in his brother; Stroke in his brother and father. There is no history of Early death, Hyperlipidemia, Kidney disease, or Cancer.  ROS:   Please see the history of present illness.    ROS  All other systems reviewed and negative.   EKGs/Labs/Other Studies Reviewed:    The following studies were reviewed today: none  EKG:  EKG is ordered today and showed NSR  Recent Labs: No results found for requested labs within last 8760 hours.   Recent Lipid Panel    Component Value Date/Time   CHOL 101 10/31/2019 1331   TRIG 98 10/31/2019 1331   HDL 36 (L) 10/31/2019 1331   CHOLHDL 2.8 10/31/2019 1331   CHOLHDL 3.0 06/14/2018 0334   VLDL 21 06/14/2018 0334   LDLCALC 46 10/31/2019 1331   LDLDIRECT 167.4 03/21/2009 1030    Physical Exam:    VS:  There were no vitals taken for this visit.    Wt Readings from Last 3 Encounters:  04/18/20 158 lb 9.6 oz (71.9 kg)  04/15/20 160 lb (72.6 kg)  04/08/20 160 lb 1.9 oz (72.6 kg)    GEN: Well nourished, well developed in no acute distress HEENT: Normal NECK: No JVD; No carotid bruits LYMPHATICS: No lymphadenopathy CARDIAC:RRR, no murmurs, rubs, gallops RESPIRATORY:  Clear to auscultation without rales, wheezing or rhonchi  ABDOMEN: Soft, non-tender, non-distended MUSCULOSKELETAL:  No edema; No deformity  SKIN: Warm and dry NEUROLOGIC:  Alert and oriented x 3 PSYCHIATRIC:  Normal affect   ASSESSMENT:    1. Coronary artery disease due to lipid rich plaque    2. Bilateral carotid artery stenosis   3. Essential hypertension   4. Hyperlipidemia LDL goal <70    PLAN:    In order of problems listed above:  1.  ASCAD -cath 07/2017 showing total occlusion of the mLAD with left to left collaterals treated with PCI/DES x1, and 75% lesion in the distal RCA treated with PCI/DES x1.   -Repeat cardiac cath 05/2018 for chest pain and abnormal nuclear stress test showed no change from prior cath a year ago with patent stents in the RCA and mid LAD.  There is a 40% PDA and 40% mid RCA. -He has not had any exertional anginal symptoms since I saw him last -he continues to have epigastric discomfort with GERD sx and I encouraged him to followup with his GI MD -Continue prescription drug management with aspirin 81 mg daily, Plavix 75 mg daily, Imdur 15 mg daily, Toprol-XL 50 mg daily, Crestor 40 mg daily with as needed refills  2.  Bilateral carotid artery stenosis -carotid dopplers 08/2018 showed no significant stenosis  3.  HTN -BP is adequately controlled on exam -Continue prescription drug management with Toprol-XL 50 mg daily and losartan 100 mg daily with as needed refills  -Check bmet  4.  HLD -LDL goal < 70 -Check FLP and ALT -Continue prescription drug management with Crestor 40 mg daily with as needed refills  5.  Tobacco abuse -his PCP ordered him Chantix but he has not started it>>I encouraged him to start the Chantix   Medication Adjustments/Labs and Tests Ordered: Current medicines are reviewed at length with the patient today.  Concerns regarding medicines are outlined above.  No orders of the defined types were placed in this encounter.  No orders of the defined types were placed in this encounter.   Signed, Fransico Him, MD  10/16/2021 9:32 AM    [place]

## 2021-10-16 NOTE — Addendum Note (Signed)
Addended by: Antonieta Iba on: 10/16/2021 10:10 AM   Modules accepted: Orders

## 2021-10-16 NOTE — Patient Instructions (Signed)
Medication Instructions:  Your physician recommends that you continue on your current medications as directed. Please refer to the Current Medication list given to you today.  *If you need a refill on your cardiac medications before your next appointment, please call your pharmacy*  Lab Work: TODAY: CMET and FLP If you have labs (blood work) drawn today and your tests are completely normal, you will receive your results only by: [place] (if you have MyChart) OR A paper copy in the mail If you have any lab test that is abnormal or we need to change your treatment, we will call you to review the results.  Follow-Up: At [facility], you and your health needs are our priority.  As part of our continuing mission to provide you with exceptional heart care, we have created designated Provider Care Teams.  These Care Teams include your primary Cardiologist (physician) and Advanced Practice Providers (APPs -  Physician Assistants and Nurse Practitioners) who all work together to provide you with the care you need, when you need it.  Your next appointment:   1 year(s)  The format for your next appointment:   In Person  Provider:   Fransico Him, MD

## 2021-10-16 NOTE — Addendum Note (Signed)
Addended by: Antonieta Iba on: 10/16/2021 10:02 AM   Modules accepted: Orders

## 2021-10-16 NOTE — Telephone Encounter (Signed)
Called patient to schedule a follow up OV visit cardiologist office called to advise the office the patient is having GI symptoms. Left voicemail

## 2022-04-07 DIAGNOSIS — Z125 Encounter for screening for malignant neoplasm of prostate: Secondary | ICD-10-CM | POA: Diagnosis not present

## 2022-04-07 DIAGNOSIS — E559 Vitamin D deficiency, unspecified: Secondary | ICD-10-CM | POA: Diagnosis not present

## 2022-04-07 DIAGNOSIS — I10 Essential (primary) hypertension: Secondary | ICD-10-CM | POA: Diagnosis not present

## 2022-04-07 DIAGNOSIS — E119 Type 2 diabetes mellitus without complications: Secondary | ICD-10-CM | POA: Diagnosis not present

## 2022-04-14 DIAGNOSIS — R82998 Other abnormal findings in urine: Secondary | ICD-10-CM | POA: Diagnosis not present

## 2022-04-24 ENCOUNTER — Other Ambulatory Visit: Payer: Self-pay | Admitting: Internal Medicine

## 2022-04-24 DIAGNOSIS — F17210 Nicotine dependence, cigarettes, uncomplicated: Secondary | ICD-10-CM

## 2022-05-21 ENCOUNTER — Ambulatory Visit
Admission: RE | Admit: 2022-05-21 | Discharge: 2022-05-21 | Disposition: A | Discharge status: Home / Self Care | Payer: Medicare Other | Source: Ambulatory Visit | Attending: Internal Medicine | Admitting: Internal Medicine

## 2022-05-21 DIAGNOSIS — F17210 Nicotine dependence, cigarettes, uncomplicated: Secondary | ICD-10-CM

## 2022-05-25 ENCOUNTER — Other Ambulatory Visit: Payer: Self-pay

## 2022-05-25 DIAGNOSIS — Z87891 Personal history of nicotine dependence: Secondary | ICD-10-CM

## 2022-05-25 DIAGNOSIS — F17210 Nicotine dependence, cigarettes, uncomplicated: Secondary | ICD-10-CM

## 2022-05-25 DIAGNOSIS — Z122 Encounter for screening for malignant neoplasm of respiratory organs: Secondary | ICD-10-CM

## 2022-10-22 ENCOUNTER — Other Ambulatory Visit: Payer: Self-pay | Admitting: Cardiology

## 2022-10-22 DIAGNOSIS — I10 Essential (primary) hypertension: Secondary | ICD-10-CM

## 2022-10-22 DIAGNOSIS — E118 Type 2 diabetes mellitus with unspecified complications: Secondary | ICD-10-CM

## 2022-11-22 ENCOUNTER — Other Ambulatory Visit: Payer: Self-pay | Admitting: Cardiology

## 2022-11-22 DIAGNOSIS — I10 Essential (primary) hypertension: Secondary | ICD-10-CM

## 2022-11-22 DIAGNOSIS — E118 Type 2 diabetes mellitus with unspecified complications: Secondary | ICD-10-CM

## 2022-11-26 ENCOUNTER — Other Ambulatory Visit: Payer: Self-pay

## 2022-11-26 DIAGNOSIS — E118 Type 2 diabetes mellitus with unspecified complications: Secondary | ICD-10-CM

## 2022-11-26 DIAGNOSIS — I10 Essential (primary) hypertension: Secondary | ICD-10-CM

## 2022-11-26 MED ORDER — LOSARTAN POTASSIUM 100 MG PO TABS: 100.0000 mg | ORAL_TABLET | Freq: Every day | ORAL | 0 refills | Status: DC

## 2022-11-26 NOTE — Addendum Note (Signed)
Addended by: Vashti Hey on: 11/26/2022 01:45 PM   Modules accepted: Orders

## 2022-12-15 ENCOUNTER — Encounter: Payer: Self-pay | Admitting: Cardiology

## 2022-12-15 ENCOUNTER — Ambulatory Visit: Payer: Medicare Other | Attending: Cardiology | Admitting: Cardiology

## 2022-12-15 VITALS — BP 128/60 | Ht 68.0 in | Wt 160.6 lb

## 2022-12-15 DIAGNOSIS — I10 Essential (primary) hypertension: Secondary | ICD-10-CM | POA: Diagnosis not present

## 2022-12-15 DIAGNOSIS — E785 Hyperlipidemia, unspecified: Secondary | ICD-10-CM | POA: Diagnosis not present

## 2022-12-15 DIAGNOSIS — I2510 Atherosclerotic heart disease of native coronary artery without angina pectoris: Secondary | ICD-10-CM

## 2022-12-15 DIAGNOSIS — I6523 Occlusion and stenosis of bilateral carotid arteries: Secondary | ICD-10-CM | POA: Diagnosis not present

## 2022-12-15 DIAGNOSIS — I2583 Coronary atherosclerosis due to lipid rich plaque: Secondary | ICD-10-CM | POA: Insufficient documentation

## 2022-12-15 LAB — ALT: ALT: 13 IU/L (ref 0–44)

## 2022-12-15 LAB — LIPID PANEL
Chol/HDL Ratio: 2.5 ratio (ref 0.0–5.0)
Cholesterol, Total: 95 mg/dL — ABNORMAL LOW (ref 100–199)
HDL: 38 mg/dL — ABNORMAL LOW (ref >39)
LDL Chol Calc (NIH): 41 mg/dL (ref 0–99)
Triglycerides: 73 mg/dL (ref 0–149)
VLDL Cholesterol Cal: 16 mg/dL (ref 5–40)

## 2022-12-15 NOTE — Progress Notes (Signed)
Cardiology Office Note:    Date:  12/15/2022   ID:  Donald Jennings, DOB 1956/08/13, MRN [id number]  PCP:  Donnajean Lopes, MD  Cardiologist:  Fransico Him, MD    Referring MD: Donnajean Lopes, MD   Chief Complaint  Patient presents with   Coronary Artery Disease   Hypertension   Hyperlipidemia     History of Present Illness:    Donald Jennings is a 67 y.o. male with a hx of CAD, HTN, HLD and ASCAD with cath 07/2017 showing total occlusion of the mLAD with left to left collaterals treated with PCI/DES x1, and 75% lesion in the distal RCA treated with PCI/DES x1. Normal LV function and LVEDP. He was placed on  DAPT with ASA/plavix.   He developed recurrent jaw and gum pain in June similar to his angina in the past with abnormal stress test showing 1.5 mm horizontal ST depression and a new apical and anterior apical defects on Myoview imaging.  Repeat  Cath showed no significant changes.   He was seen several times by extenders due to chest pain.  It was felt that it was likely GI in etiology given that he is undergone prior esophageal dilatations and his symptoms would improve with Maalox as well as Protonix.  At last office visit Richardson Dopp, PA referred him back to GI and increased his Protonix to 40 mg twice daily.  Nuclear stress test 04/15/2020 showed no ischemia.  He is here today for followup and is doing well.  He denies any chest pain or pressure, SOB, DOE, PND, orthopnea, LE edema, dizziness, palpitations or syncope. He is compliant with his meds and is tolerating meds with no SE.     Past Medical History:  Diagnosis Date   Abnormal nuclear stress test 06/13/2018   Abnormal thallium stress test 05/20/2016   Amaurosis fugax 06/03/2016   CAD (coronary artery disease), native coronary artery 07/27/2017   < 40% prox LAD, 50% mid LAD and 40% distal RCA // Myoview 03/2020: EF 66, no ischemia or infarction, Low Risk   Carotid artery stenosis 07/27/2017   1-39% bilateral   CERVICAL  RADICULOPATHY 07/02/2010   Qualifier: Diagnosis of  By: Ronnald Ramp MD, Arvid Right.    Coronary artery disease due to lipid rich plaque    Coronary artery disease with exertional angina ([place]) 08/04/2017   DEPRESSION 03/21/2009   Qualifier: Diagnosis of  By: Ronnald Ramp MD, Arvid Right.    Diabetes mellitus without complication ([place])    Dysphagia 05/18/2016   Eczema, dyshidrotic 05/27/2015   ELECTROCARDIOGRAM, ABNORMAL 03/21/2009   Essential hypertension 06/19/2009        GERD 06/19/2009   Qualifier: Diagnosis of  By: Ronnald Ramp MD, Arvid Right.    HLD (hyperlipidemia)    Hx of cardiovascular stress test    ETT-Myoview (03/2014):  No ischemia, EF 56%; Low Risk   Hyperlipidemia LDL goal <70 06/03/2016   Lytic bone lesions on xray 02/22/2017   Neuropathy of both feet 05/28/2015   Pure hypercholesterolemia    Routine general medical examination at a health care facility 04/04/2013   SHOULDER INJURY, RIGHT 07/02/2010   Qualifier: Diagnosis of  By: Ronnald Ramp MD, Arvid Right.    SOB (shortness of breath) 06/10/2016   Solitary plasmacytoma of bone ([place]) 03/05/2017   per pt/was not cancer   TOBACCO USE 03/21/2009   Qualifier: Diagnosis of  By: Ronnald Ramp MD, Arvid Right.    Type 2 diabetes mellitus with manifestations ([place]) 03/27/2015  not started the medication yet!   Unstable angina ([facility])    Vertebral artery stenosis, asymptomatic 06/22/2016   Vitamin D deficiency 04/04/2013    Past Surgical History:  Procedure Laterality Date   CARDIAC CATHETERIZATION  2018   placement of one stent   CATARACT EXTRACTION Bilateral    Left 2009 at [facility], Right 2004 in [place]  07/25/2015   Dr Carol Ada   CORONARY STENT INTERVENTION N/A 08/04/2017   Procedure: CORONARY STENT INTERVENTION;  Surgeon: Sherren Mocha, MD;  Location: [place] [facility];  Service: Cardiovascular;  Laterality: N/A;   ESOPHAGEAL MANOMETRY N/A 11/14/2018   Procedure: ESOPHAGEAL MANOMETRY (EM);  Surgeon: Gatha Mayer, MD;  Location: [facility];  Service:  Endoscopy;  Laterality: N/A;   ESOPHAGEAL MANOMETRY N/A 06/05/2020   Procedure: ESOPHAGEAL MANOMETRY (EM);  Surgeon: Gatha Mayer, MD;  Location: [facility];  Service: Endoscopy;  Laterality: N/A;   INGUINAL HERNIA REPAIR Right 1986   IR FLUORO GUIDED NEEDLE PLC ASPIRATION/INJECTION LOC  03/02/2017   IR FLUORO GUIDED NEEDLE PLC ASPIRATION/INJECTION LOC  03/02/2017   IR FLUORO GUIDED NEEDLE PLC ASPIRATION/INJECTION LOC  03/02/2017   IR RADIOLOGIST EVAL & MGMT  03/01/2017   LEFT HEART CATH AND CORONARY ANGIOGRAPHY N/A 08/04/2017   Procedure: LEFT HEART CATH AND CORONARY ANGIOGRAPHY;  Surgeon: Sherren Mocha, MD;  Location: [facility];  Service: Cardiovascular;  Laterality: N/A;   LEFT HEART CATH AND CORONARY ANGIOGRAPHY N/A 06/14/2018   Procedure: LEFT HEART CATH AND CORONARY ANGIOGRAPHY;  Surgeon: Troy Sine, MD;  Location: [facility];  Service: Cardiovascular;  Laterality: N/A;    Current Medications: Current Meds  Medication Sig   aspirin EC 81 MG tablet Take 81 mg by mouth at bedtime.   clopidogrel (PLAVIX) 75 MG tablet Take 1 tablet (75 mg total) by mouth daily.   diclofenac sodium (VOLTAREN) 1 % GEL Apply 1 application topically as needed for pain.   isosorbide mononitrate (IMDUR) 30 MG 24 hr tablet Take 0.5 tablets (15 mg total) by mouth daily.   lidocaine (LIDODERM) 5 % Place 1 patch onto the skin daily as needed (pain). Remove & Discard patch within 12 hours or as directed by MD    losartan (COZAAR) 100 MG tablet Take 1 tablet (100 mg total) by mouth daily.   metFORMIN (GLUCOPHAGE-XR) 500 MG 24 hr tablet Take 500 mg by mouth daily with breakfast.   metoprolol succinate (TOPROL-XL) 25 MG 24 hr tablet Take 2 tablets (50 mg total) by mouth daily.   Multiple Vitamins-Minerals (MULTIVITAMIN ADULT) TABS Take 1 tablet by mouth at bedtime.    nitroGLYCERIN (NITROSTAT) 0.4 MG SL tablet Place 1 tablet (0.4 mg total) under the tongue every 5 (five) minutes as needed.    pantoprazole (PROTONIX) 40 MG tablet Take 1 tablet (40 mg total) by mouth daily before breakfast.   rosuvastatin (CRESTOR) 40 MG tablet TAKE 1 TABLET BY MOUTH DAILY. PT NEEDS TO KEEP UPCOMING APPT IN NOV FOR FURTHER REFILLS   tiotropium (SPIRIVA) 18 MCG inhalation capsule Place 18 mcg into inhaler and inhale daily.   varenicline (CHANTIX CONTINUING MONTH PAK) 1 MG tablet Take 1 tablet (1 mg total) by mouth 2 (two) times daily. Take as directed.   varenicline (CHANTIX) 0.5 MG tablet Take 1 tablet (0.5 mg total) by mouth 2 (two) times daily. Take as directed.   Vitamin D, Ergocalciferol, (DRISDOL) 50000 units CAPS capsule TAKE 1 CAPSULE (50,000 UNITS TOTAL) BY MOUTH  ONCE A WEEK.     Allergies:   Pork-derived products   Social History   Socioeconomic History   Marital status: Married    Spouse name: Not on file   Number of children: 1   Years of education: Not on file   Highest education level: Not on file  Occupational History   Occupation: Scientist, clinical (histocompatibility and immunogenetics)    Employer: [employer]  Tobacco Use   Smoking status: Every Day    Packs/day: 0.05    Years: 38.50    Total pack years: 1.93    Types: Cigarettes   Smokeless tobacco: Never   Tobacco comments:    Currently  smokes 1/2-1pk ppd and is considering Chantix  Vaping Use   Vaping Use: Never used  Substance and Sexual Activity   Alcohol use: No   Drug use: No   Sexual activity: Not on file  Other Topics Concern   Not on file  Social History Narrative   Married 1 sone born 1998   From [place]   Manager [employer]   2 caffeine drinks daily   Smoker, no alcohol no drugs   Social Determinants of Radio broadcast assistant Strain: Not on file  Food Insecurity: Not on file  Transportation Needs: Not on file  Physical Activity: Not on file  Stress: Not on file  Social Connections: Not on file     Family History: [name] family history includes Arthritis in his mother; CAD in his brother and  brother; Diabetes in his mother; Gallstones in his brother; Heart disease in his brother, brother, and father; Hypertension in his father; Kidney Stones in his brother; Stroke in his brother and father. There is no history of Early death, Hyperlipidemia, Kidney disease, or Cancer.  ROS:   Please see the history of present illness.    ROS  All other systems reviewed and negative.   EKGs/Labs/Other Studies Reviewed:    The following studies were reviewed today: none  EKG:  EKG is ordered today and showed NSR with nonspecific ST abnormality no change from 2022  Recent Labs: No results found for requested labs within last 365 days.   Recent Lipid Panel    Component Value Date/Time   CHOL 92 (L) 10/16/2021 1031   TRIG 85 10/16/2021 1031   HDL 35 (L) 10/16/2021 1031   CHOLHDL 2.6 10/16/2021 1031   CHOLHDL 3.0 06/14/2018 0334   VLDL 21 06/14/2018 0334   LDLCALC 40 10/16/2021 1031   LDLDIRECT 167.4 03/21/2009 1030    Physical Exam:    VS:  BP 128/60   Ht '5\' 8"'$  (1.727 m)   Wt 160 lb 9.6 oz (72.8 kg)   SpO2 98%   BMI 24.42 kg/m     Wt Readings from Last 3 Encounters:  12/15/22 160 lb 9.6 oz (72.8 kg)  10/16/21 153 lb 6.4 oz (69.6 kg)  04/18/20 158 lb 9.6 oz (71.9 kg)    GEN: Well nourished, well developed in no acute distress HEENT: Normal NECK: No JVD; No carotid bruits LYMPHATICS: No lymphadenopathy CARDIAC:RRR, no murmurs, rubs, gallops RESPIRATORY:  Clear to auscultation without rales, wheezing or rhonchi  ABDOMEN: Soft, non-tender, non-distended MUSCULOSKELETAL:  No edema; No deformity  SKIN: Warm and dry NEUROLOGIC:  Alert and oriented x 3 PSYCHIATRIC:  Normal affect  ASSESSMENT:    1. Coronary artery disease due to lipid rich plaque   2. Bilateral carotid artery stenosis   3. Essential hypertension   4. Hyperlipidemia LDL goal <  70    PLAN:    In order of problems listed above:  1.  ASCAD -cath 07/2017 showing total occlusion of the mLAD with left to left  collaterals treated with PCI/DES x1, and 75% lesion in the distal RCA treated with PCI/DES x1.   -Repeat cardiac cath 05/2018 for chest pain and abnormal nuclear stress test showed no change from prior cath a year ago with patent stents in the RCA and mid LAD.  There is a 40% PDA and 40% mid RCA. -He denies any anginal symptoms since I saw him last -Continue prescription drug management 81 mg daily, Plavix 75 mg daily, Imdur 15 mg daily, Toprol-XL 50 mg daily and Crestor 40 mg daily with as needed refills  2.  Bilateral carotid artery stenosis -carotid dopplers 08/2018 showed no significant stenosis  3.  HTN -BP is well-controlled on exam today -Continue prescription drug management with losartan 100 mg daily and Toprol-XL 50 mg daily with as needed refills -Check be met today  4.  HLD -LDL goal < 70 -Check FLP and ALT -Continue prescription drug management Crestor 40 mg daily with as needed refills   Medication Adjustments/Labs and Tests Ordered: Current medicines are reviewed at length with the patient today.  Concerns regarding medicines are outlined above.  No orders of the defined types were placed in this encounter.  No orders of the defined types were placed in this encounter.   Signed, Fransico Him, MD  12/15/2022 11:34 AM    [place]

## 2022-12-15 NOTE — Patient Instructions (Signed)
Medication Instructions:  Your physician recommends that you continue on your current medications as directed. Please refer to the Current Medication list given to you today.  *If you need a refill on your cardiac medications before your next appointment, please call your pharmacy*  Lab Work: Your physician recommends that you have lab work today- Fasting lipid panel and ALT  If you have labs (blood work) drawn today and your tests are completely normal, you will receive your results only by: [place] (if you have MyChart) OR A paper copy in the mail If you have any lab test that is abnormal or we need to change your treatment, we will call you to review the results.  Testing/Procedures: None ordered today.  Follow-Up: At [facility], you and your health needs are our priority.  As part of our continuing mission to provide you with exceptional heart care, we have created designated Provider Care Teams.  These Care Teams include your primary Cardiologist (physician) and Advanced Practice Providers (APPs -  Physician Assistants and Nurse Practitioners) who all work together to provide you with the care you need, when you need it.  We recommend signing up for the patient portal called "MyChart".  Sign up information is provided on this After Visit Summary.  MyChart is used to connect with patients for Virtual Visits (Telemedicine).  Patients are able to view lab/test results, encounter notes, upcoming appointments, etc.  Non-urgent messages can be sent to your provider as well.   To learn more about what you can do with MyChart, go to [email or web address].    Your next appointment:   1 year(s)  Provider:   Fransico Him, MD

## 2022-12-15 NOTE — Addendum Note (Signed)
Addended by: Aris Georgia, Dragan Tamburrino L on: 12/15/2022 11:39 AM   Modules accepted: Orders

## 2022-12-18 ENCOUNTER — Telehealth: Payer: Self-pay

## 2022-12-18 NOTE — Telephone Encounter (Signed)
Left detailed message (per DPR) on patient's voicemail. Forwarded results to PCP.

## 2022-12-18 NOTE — Telephone Encounter (Signed)
-----  Message from Precious Gilding, RN sent at 12/16/2022  1:34 PM EST -----  ----- Message ----- From: Sueanne Margarita, MD Sent: 12/16/2022   1:17 PM EST To: Cv Div [facility] Triage  Lipids at goal continue current therapy and forward to PCP

## 2022-12-23 ENCOUNTER — Other Ambulatory Visit: Payer: Self-pay | Admitting: Cardiology

## 2022-12-24 ENCOUNTER — Other Ambulatory Visit: Payer: Self-pay | Admitting: Cardiology

## 2022-12-30 NOTE — Addendum Note (Signed)
Addended by: Patterson Hammersmith A on: 12/30/2022 02:24 PM   Modules accepted: Orders

## 2023-01-04 ENCOUNTER — Other Ambulatory Visit: Payer: Self-pay | Admitting: Cardiology

## 2023-01-04 DIAGNOSIS — I10 Essential (primary) hypertension: Secondary | ICD-10-CM

## 2023-01-28 ENCOUNTER — Other Ambulatory Visit: Payer: Self-pay | Admitting: Cardiology

## 2023-03-02 ENCOUNTER — Ambulatory Visit: Payer: Medicare Other | Admitting: Cardiology

## 2023-03-19 ENCOUNTER — Other Ambulatory Visit: Payer: Self-pay | Admitting: Cardiology

## 2023-03-19 DIAGNOSIS — I10 Essential (primary) hypertension: Secondary | ICD-10-CM

## 2023-03-19 DIAGNOSIS — E118 Type 2 diabetes mellitus with unspecified complications: Secondary | ICD-10-CM

## 2023-04-27 ENCOUNTER — Other Ambulatory Visit: Payer: Self-pay | Admitting: Acute Care

## 2023-04-27 DIAGNOSIS — Z87891 Personal history of nicotine dependence: Secondary | ICD-10-CM

## 2023-04-27 DIAGNOSIS — F17210 Nicotine dependence, cigarettes, uncomplicated: Secondary | ICD-10-CM

## 2023-04-27 DIAGNOSIS — Z122 Encounter for screening for malignant neoplasm of respiratory organs: Secondary | ICD-10-CM

## 2023-05-25 ENCOUNTER — Telehealth: Payer: Self-pay | Admitting: Acute Care

## 2023-05-25 NOTE — Telephone Encounter (Signed)
Left message for pt to call back to reschedule lung screening CT.  

## 2023-05-25 NOTE — Telephone Encounter (Signed)
Must cancel his appt Fri for CT. Fridays no good. Good days are Mon Tue Wed.. Please call to sched. His # [phone]

## 2023-05-28 ENCOUNTER — Inpatient Hospital Stay: Admission: RE | Admit: 2023-05-28 | Payer: Medicare Other | Source: Ambulatory Visit

## 2023-05-28 NOTE — Telephone Encounter (Signed)
Spoke with pt and rescheduled LDCT 06/23/23 10:00.

## 2023-06-23 ENCOUNTER — Ambulatory Visit
Admission: RE | Admit: 2023-06-23 | Discharge: 2023-06-23 | Disposition: A | Discharge status: Home / Self Care | Payer: Medicare Other | Source: Ambulatory Visit | Attending: Acute Care | Admitting: Acute Care

## 2023-06-23 DIAGNOSIS — Z122 Encounter for screening for malignant neoplasm of respiratory organs: Secondary | ICD-10-CM

## 2023-06-23 DIAGNOSIS — Z87891 Personal history of nicotine dependence: Secondary | ICD-10-CM

## 2023-06-23 DIAGNOSIS — F17210 Nicotine dependence, cigarettes, uncomplicated: Secondary | ICD-10-CM

## 2023-07-02 ENCOUNTER — Other Ambulatory Visit: Payer: Self-pay | Admitting: Acute Care

## 2023-07-02 DIAGNOSIS — Z122 Encounter for screening for malignant neoplasm of respiratory organs: Secondary | ICD-10-CM

## 2023-07-02 DIAGNOSIS — Z87891 Personal history of nicotine dependence: Secondary | ICD-10-CM

## 2023-07-02 DIAGNOSIS — F17210 Nicotine dependence, cigarettes, uncomplicated: Secondary | ICD-10-CM

## 2023-07-27 ENCOUNTER — Telehealth: Payer: Self-pay | Admitting: Cardiology

## 2023-07-27 ENCOUNTER — Other Ambulatory Visit: Payer: Self-pay | Admitting: Urology

## 2023-07-27 NOTE — Telephone Encounter (Signed)
   [facility] Pre-operative Risk Assessment    Request for surgical clearance:  What type of surgery is being performed?  Bladder Biopsy   When is this surgery scheduled?  09/03/23   What type of clearance is required (medical clearance vs. Pharmacy clearance to hold med vs. Both)?  Both   Are there any medications that need to be held prior to surgery and how long? Plavix + Aspirin, 5 days prior   Practice name and name of physician performing surgery?  [facility]  Dr. Annabell Howells   What is your office phone number? [phone] (ext#: [phone])    7.   What is your office fax number? [phone]  8.   Anesthesia type (None, local, MAC, general) General    Donald Jennings 07/27/2023, 2:11 PM  _________________________________________________________________   (provider comments below)

## 2023-07-28 NOTE — Telephone Encounter (Signed)
Left message to call back to set up tele pre op appt.  

## 2023-07-28 NOTE — Telephone Encounter (Signed)
   Name: Donald Jennings  DOB: [birth year]  MRN: [id number]  Primary Cardiologist: Armanda Magic, MD   Preoperative team, please contact this patient and set up a phone call appointment for further preoperative risk assessment. Please obtain consent and complete medication review. Thank you for your help.  I confirm that guidance regarding antiplatelet and oral anticoagulation therapy has been completed and, if necessary, noted below.  Per Dr. Mayford Knife, he may hold Plavix and aspirin for 5-7 days prior to procedure and should resume as soon as hemodynamically stable postoperatively.    Carlos Levering, NP 07/28/2023, 11:24 AM [facility]

## 2023-07-28 NOTE — Telephone Encounter (Signed)
Dr. Mayford Knife,  This patient is pending bladder biopsy scheduled for 09/03/2023. Request was made for holding Plavix and aspirin 5 days prior to procedure. Patient had a stent 2.0 x 30 mm placed to proximal to mid LAD on 08/04/2017. LHC July 2019 showed patent stent. Patient had normal stress test in May 2021. Per protocol, due to length of stent, will you please provide recommendations for holding Plavix and aspirin?   Please route your response to [facility] Preop. I will communicate with requesting office once you have given recommendations.   Thank you!  Donald Levering, NP

## 2023-07-30 ENCOUNTER — Telehealth: Payer: Self-pay | Admitting: *Deleted

## 2023-07-30 NOTE — Telephone Encounter (Signed)
Lmtcb to schedule telephone clearance.

## 2023-08-03 NOTE — Telephone Encounter (Signed)
3rd and final attempt. I called pt, I started with " This is Donald Jennings from [facility], Dr. Malachy Mood office can I speak to [name]?" Pt then hung up. I will update requesting surgeons office and remove from pool

## 2023-08-06 ENCOUNTER — Telehealth: Payer: Self-pay

## 2023-08-06 NOTE — Telephone Encounter (Signed)
Patient called back wanting to schedule a tele visit for pre-op clearance. Patient is agreeable to be scheduled on 9/17 at 9:40 am. Med rec and consent has been given.

## 2023-08-06 NOTE — Telephone Encounter (Signed)
  Patient Consent for Virtual Visit        Donald Jennings has provided verbal consent on 08/06/2023 for a virtual visit (video or telephone).   CONSENT FOR VIRTUAL VISIT FOR:  Donald Jennings  By participating in this virtual visit I agree to the following:  I hereby voluntarily request, consent and authorize [facility] and its employed or contracted physicians, physician assistants, nurse practitioners or other licensed health care professionals (the Practitioner), to provide me with telemedicine health care services (the "Services") as deemed necessary by the treating Practitioner. I acknowledge and consent to receive the Services by the Practitioner via telemedicine. I understand that the telemedicine visit will involve communicating with the Practitioner through live audiovisual communication technology and the disclosure of certain medical information by electronic transmission. I acknowledge that I have been given the opportunity to request an in-person assessment or other available alternative prior to the telemedicine visit and am voluntarily participating in the telemedicine visit.  I understand that I have the right to withhold or withdraw my consent to the use of telemedicine in the course of my care at any time, without affecting my right to future care or treatment, and that the Practitioner or I may terminate the telemedicine visit at any time. I understand that I have the right to inspect all information obtained and/or recorded in the course of the telemedicine visit and may receive copies of available information for a reasonable fee.  I understand that some of the potential risks of receiving the Services via telemedicine include:  Delay or interruption in medical evaluation due to technological equipment failure or disruption; Information transmitted may not be sufficient (e.g. poor resolution of images) to allow for appropriate medical decision making by the Practitioner; and/or   In rare instances, security protocols could fail, causing a breach of personal health information.  Furthermore, I acknowledge that it is my responsibility to provide information about my medical history, conditions and care that is complete and accurate to the best of my ability. I acknowledge that Practitioner's advice, recommendations, and/or decision may be based on factors not within their control, such as incomplete or inaccurate data provided by me or distortions of diagnostic images or specimens that may result from electronic transmissions. I understand that the practice of medicine is not an exact science and that Practitioner makes no warranties or guarantees regarding treatment outcomes. I acknowledge that a copy of this consent can be made available to me via my patient portal [facility] MyChart), or I can request a printed copy by calling the office of [facility].    I understand that my insurance will be billed for this visit.   I have read or had this consent read to me. I understand the contents of this consent, which adequately explains the benefits and risks of the Services being provided via telemedicine.  I have been provided ample opportunity to ask questions regarding this consent and the Services and have had my questions answered to my satisfaction. I give my informed consent for the services to be provided through the use of telemedicine in my medical care

## 2023-08-17 ENCOUNTER — Ambulatory Visit: Payer: Medicare Other | Attending: Physician Assistant

## 2023-08-17 DIAGNOSIS — Z01818 Encounter for other preprocedural examination: Secondary | ICD-10-CM

## 2023-08-17 NOTE — Progress Notes (Signed)
Virtual Visit via Telephone Note   Because of Cai Phipps Kuznia's co-morbid illnesses, he is at least at moderate risk for complications without adequate follow up.  This format is felt to be most appropriate for this patient at this time.  The patient did not have access to video technology/had technical difficulties with video requiring transitioning to audio format only (telephone).  All issues noted in this document were discussed and addressed.  No physical exam could be performed with this format.  Please refer to the patient's chart for his consent to telehealth for [facility].  Evaluation Performed:  Preoperative cardiovascular risk assessment _____________   Date:  08/17/2023   Patient ID:  Yiannis Serrao Ronda, DOB 09/02/1956, MRN [id number] Patient Location:  Home Provider location:   Office  Primary Care Provider:  Garlan Fillers, MD Primary Cardiologist:  Armanda Magic, MD  Chief Complaint / Patient Profile   67 y.o. y/o male with a h/o CAD, HTN, and HLD who is pending bladder biopsy and presents today for telephonic preoperative cardiovascular risk assessment.  History of Present Illness    MAKAR VONGPHAKDY is a 68 y.o. male who presents via audio/video conferencing for a telehealth visit today.  Pt was last seen in cardiology clinic on 12/15/22 by Dr. Mayford Knife.  At that time [facility] was doing well without any significant symptoms.  The patient is now pending procedure as outlined above. Since his last visit, he has been doing good but he isn't as physical as he used to be. When he cuts grass he gets exhausted quickly. He has to take breaks and get some water. It has been going on for a year. He has some tingling on his gum. He notices it "very tiny". Not as severe as it was when he saw Dr. Mayford Knife.  Per Dr. Mayford Knife, he may hold Plavix and aspirin for 5-7 days prior to procedure and should resume as soon as hemodynamically stable postoperatively. Last dose 9/28.   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 He deos not feel that exhausted or tired at all. When he walks in his neighborhood he does have fatigue. Encouraged to incorporate more sustained cardiovascular activity. There might be a component of exercise intolerance.   Past Medical History    Past Medical History:  Diagnosis Date   Abnormal nuclear stress test 06/13/2018   Abnormal thallium stress test 05/20/2016   Amaurosis fugax 06/03/2016   CAD (coronary artery disease), native coronary artery 07/27/2017   < 40% prox LAD, 50% mid LAD and 40% distal RCA // Myoview 03/2020: EF 66, no ischemia or infarction, Low Risk   Carotid artery stenosis 07/27/2017   1-39% bilateral   CERVICAL RADICULOPATHY 07/02/2010   Qualifier: Diagnosis of  By: Yetta Barre MD, Bernadene Bell.    Coronary artery disease due to lipid rich plaque    Coronary artery disease with exertional angina (HCC) 08/04/2017   DEPRESSION 03/21/2009   Qualifier: Diagnosis of  By: Yetta Barre MD, Bernadene Bell.    Diabetes mellitus without complication (HCC)    Dysphagia 05/18/2016   Eczema, dyshidrotic 05/27/2015   ELECTROCARDIOGRAM, ABNORMAL 03/21/2009   Essential hypertension 06/19/2009        GERD 06/19/2009   Qualifier: Diagnosis of  By: Yetta Barre MD, Bernadene Bell.    HLD (hyperlipidemia)    Hx of cardiovascular stress test    ETT-Myoview (03/2014):  No ischemia, EF 56%; Low Risk   Hyperlipidemia LDL goal <70  06/03/2016   Lytic bone lesions on xray 02/22/2017   Neuropathy of both feet 05/28/2015   Pure hypercholesterolemia    Routine general medical examination at a health care facility 04/04/2013   SHOULDER INJURY, RIGHT 07/02/2010   Qualifier: Diagnosis of  By: Yetta Barre MD, Bernadene Bell.    SOB (shortness of breath) 06/10/2016   Solitary plasmacytoma of bone (HCC) 03/05/2017   per pt/was not cancer   TOBACCO USE 03/21/2009   Qualifier: Diagnosis of  By: Yetta Barre MD, Bernadene Bell.    Type 2 diabetes mellitus with manifestations (HCC) 03/27/2015   not started the medication yet!   Unstable  angina (HCC)    Vertebral artery stenosis, asymptomatic 06/22/2016   Vitamin D deficiency 04/04/2013   Past Surgical History:  Procedure Laterality Date   CARDIAC CATHETERIZATION  2018   placement of one stent   CATARACT EXTRACTION Bilateral    Left 2009 at [facility], Right 2004 in [place]   COLONOSCOPY  07/25/2015   Dr Jeani Hawking   CORONARY STENT INTERVENTION N/A 08/04/2017   Procedure: CORONARY STENT INTERVENTION;  Surgeon: Tonny Bollman, MD;  Location: [facility] INVASIVE CV LAB;  Service: Cardiovascular;  Laterality: N/A;   ESOPHAGEAL MANOMETRY N/A 11/14/2018   Procedure: ESOPHAGEAL MANOMETRY (EM);  Surgeon: Iva Boop, MD;  Location: [facility] ENDOSCOPY;  Service: Endoscopy;  Laterality: N/A;   ESOPHAGEAL MANOMETRY N/A 06/05/2020   Procedure: ESOPHAGEAL MANOMETRY (EM);  Surgeon: Iva Boop, MD;  Location: [facility] ENDOSCOPY;  Service: Endoscopy;  Laterality: N/A;   INGUINAL HERNIA REPAIR Right 1986   IR FLUORO GUIDED NEEDLE PLC ASPIRATION/INJECTION LOC  03/02/2017   IR FLUORO GUIDED NEEDLE PLC ASPIRATION/INJECTION LOC  03/02/2017   IR FLUORO GUIDED NEEDLE PLC ASPIRATION/INJECTION LOC  03/02/2017   IR RADIOLOGIST EVAL & MGMT  03/01/2017   LEFT HEART CATH AND CORONARY ANGIOGRAPHY N/A 08/04/2017   Procedure: LEFT HEART CATH AND CORONARY ANGIOGRAPHY;  Surgeon: Tonny Bollman, MD;  Location: [facility] INVASIVE CV LAB;  Service: Cardiovascular;  Laterality: N/A;   LEFT HEART CATH AND CORONARY ANGIOGRAPHY N/A 06/14/2018   Procedure: LEFT HEART CATH AND CORONARY ANGIOGRAPHY;  Surgeon: Lennette Bihari, MD;  Location: [facility] INVASIVE CV LAB;  Service: Cardiovascular;  Laterality: N/A;    Allergies  Allergies  Allergen Reactions   Pork-Derived Products     Per pt, not allergic to pork!    Home Medications    Prior to Admission medications   Medication Sig Start Date End Date Taking? Authorizing Provider  albuterol (VENTOLIN HFA) 108 (90 Base) MCG/ACT inhaler Inhale 1-2 puffs into the lungs every 6 (six) hours as  needed for wheezing or shortness of breath.    [provider]  aspirin EC 81 MG tablet Take 81 mg by mouth at bedtime.    [provider]  clopidogrel (PLAVIX) 75 MG tablet TAKE 1 TABLET BY MOUTH EVERY DAY Patient taking differently: Take 75 mg by mouth at bedtime. 01/28/23   Quintella Reichert, MD  diclofenac sodium (VOLTAREN) 1 % GEL Apply 1 application  topically 4 (four) times daily as needed for pain.    [provider]  isosorbide mononitrate (IMDUR) 30 MG 24 hr tablet Take 0.5 tablets (15 mg total) by mouth daily. Patient taking differently: Take 15 mg by mouth at bedtime. 12/23/22   Quintella Reichert, MD  losartan (COZAAR) 100 MG tablet TAKE 1 TABLET BY MOUTH EVERY DAY Patient taking differently: Take 100 mg by mouth at bedtime. 03/19/23   Armanda Magic  R, MD  metFORMIN (GLUCOPHAGE-XR) 500 MG 24 hr tablet Take 500 mg by mouth every evening.    [provider]  metoprolol succinate (TOPROL-XL) 25 MG 24 hr tablet TAKE 2 TABLETS BY MOUTH EVERY DAY Patient taking differently: Take 50 mg by mouth at bedtime. 01/05/23   Quintella Reichert, MD  Multiple Vitamins-Minerals (MULTIVITAMIN ADULT) TABS Take 1 tablet by mouth at bedtime.     [provider]  nitroGLYCERIN (NITROSTAT) 0.4 MG SL tablet Place 1 tablet (0.4 mg total) under the tongue every 5 (five) minutes as needed. 10/16/21   Quintella Reichert, MD  pantoprazole (PROTONIX) 40 MG tablet Take 1 tablet (40 mg total) by mouth daily before breakfast. 04/18/20   Iva Boop, MD  rosuvastatin (CRESTOR) 40 MG tablet TAKE 1 TABLET BY MOUTH DAILY. Patient taking differently: Take 40 mg by mouth at bedtime. TAKE 1 TABLET BY MOUTH DAILY. 12/24/22   Quintella Reichert, MD    Physical Exam    Vital Signs:  Dalphine Handing does not have vital signs available for review today.  Given telephonic nature of communication, physical exam is limited. AAOx3. NAD. Normal affect.  Speech and respirations are unlabored.  Accessory  Clinical Findings    None  Assessment & Plan    1.  Preoperative Cardiovascular Risk Assessment:  Mr. Krampitz perioperative risk of a major cardiac event is 0.9% according to the Revised Cardiac Risk Index (RCRI).  Therefore, he is at low risk for perioperative complications.   His functional capacity is good at 6.61 METs according to the Duke Activity Status Index (DASI). Recommendations: According to ACC/AHA guidelines, no further cardiovascular testing needed.  The patient may proceed to surgery at acceptable risk.   Antiplatelet and/or Anticoagulation Recommendations: Aspirin can be held for 5-7 days prior to his surgery.  Please resume Aspirin post operatively when it is felt to be safe from a bleeding standpoint.  Clopidogrel (Plavix) can be held for 5-7 days prior to his surgery and resumed as soon as possible post op.  Time:   Today, I have spent 15 minutes with the patient with telehealth technology discussing medical history, symptoms, and management plan.     Sharlene Dory, PA-C  08/17/2023, 9:40 AM

## 2023-08-20 NOTE — Progress Notes (Signed)
COVID Vaccine received:  []  No [x]  Yes Date of any COVID positive Test in last 90 days: no PCP - Jarome Matin MD Cardiologist - Armanda Magic MD  Cardiac clearance- Jari Favre- 08/17/23  Chest x-ray -  EKG -  12/15/22 Epic Stress Test - 04/15/20 Epic ECHO -  Cardiac Cath - 08/04/17,  06/14/18 Epic  Bowel Prep - [x]  No  []   Yes ______  Pacemaker / ICD device [x]  No []  Yes   Spinal Cord Stimulator:[]  No []  Yes       History of Sleep Apnea? [x]  No []  Yes   CPAP used?- [x]  No []  Yes    Does the patient monitor blood sugar?          [x]  No []  Yes  []  N/A  Patient has: []  NO Hx DM   []  Pre-DM                 []  DM1  [x]   DM2 Does patient have a [employer] or Dexacom? []  No []  Yes   Fasting Blood Sugar Ranges-  Checks Blood Sugar _____ times a day  GLP1 agonist / usual dose - no GLP1 instructions:  SGLT-2 inhibitors / usual dose - no SGLT-2 instructions:   Blood Thinner / Instructions:Plavix. Hold 5-7 days per MD office. Last dose 08/28/23 Aspirin Instructions:81 mg-  May continue to take per MD office  Comments:   Activity level: Patient is able  to climb a flight of stairs without difficulty; [x]  No CP  [x]  No SOB, but would have ___   Patient can  perform ADLs without assistance.   Anesthesia review: HTN,DM2,CAD,smoker  Patient denies shortness of breath, fever, cough and chest pain at PAT appointment.  Patient verbalized understanding and agreement to the Pre-Surgical Instructions that were given to them at this PAT appointment. Patient was also educated of the need to review these PAT instructions again prior to his/her surgery.I reviewed the appropriate phone numbers to call if they have any and questions or concerns.

## 2023-08-20 NOTE — Patient Instructions (Signed)
SURGICAL WAITING ROOM VISITATION  Patients having surgery or a procedure may have no more than 2 support people in the waiting area - these visitors may rotate.    Children under the age of 25 must have an adult with them who is not the patient.  Due to an increase in RSV and influenza rates and associated hospitalizations, children ages 27 and under may not visit patients in [facility] hospitals.  If the patient needs to stay at the hospital during part of their recovery, the visitor guidelines for inpatient rooms apply. Pre-op nurse will coordinate an appropriate time for 1 support person to accompany patient in pre-op.  This support person may not rotate.    Please refer to the [facility] website for the visitor guidelines for Inpatients (after your surgery is over and you are in a regular room).       Your procedure is scheduled on: 09/03/23   Report to [facility] Main Entrance    Report to admitting at 7:15 AM   Call this number if you have problems the morning of surgery [phone]   Do not eat food  or drink liquids :After Midnight.      Oral Hygiene is also important to reduce your risk of infection.                                    Remember - BRUSH YOUR TEETH THE MORNING OF SURGERY WITH YOUR REGULAR TOOTHPASTE  DENTURES WILL BE REMOVED PRIOR TO SURGERY PLEASE DO NOT APPLY "Poly grip" OR ADHESIVES!!!   Do NOT smoke after Midnight   Stop all vitamins and herbal supplements 7 days before surgery.   Take these medicines the morning of surgery with A SIP OF WATER: Isosorbide, Metoprolol, Pantoprazole, Rosuvastatin  DO NOT TAKE ANY ORAL DIABETIC MEDICATIONS DAY OF YOUR SURGERY-HOLD METFORMIN THE MORNING OF SURGERY             You may not have any metal on your body including hair pins, jewelry, and body piercing             Do not wear make-up, lotions, powders, perfumes/cologne, or deodorant              Men may shave face and neck.   Do not bring  valuables to the hospital. [facility] IS NOT             RESPONSIBLE   FOR VALUABLES.   Contacts, glasses, dentures or bridgework may not be worn into surgery.  DO NOT BRING YOUR HOME MEDICATIONS TO THE HOSPITAL. PHARMACY WILL DISPENSE MEDICATIONS LISTED ON YOUR MEDICATION LIST TO YOU DURING YOUR ADMISSION IN THE HOSPITAL!    Patients discharged on the day of surgery will not be allowed to drive home.  Someone NEEDS to stay with you for the first 24 hours after anesthesia.   Special Instructions: Bring a copy of your healthcare power of attorney and living will documents the day of surgery if you haven't scanned them before.              Please read over the following fact sheets you were given: IF YOU HAVE QUESTIONS ABOUT YOUR PRE-OP INSTRUCTIONS PLEASE CALL [phone] Donald Jennings   If you received a COVID test during your pre-op visit  it is requested that you wear a mask when out in public, stay away from anyone that may not  be feeling well and notify your surgeon if you develop symptoms. If you test positive for Covid or have been in contact with anyone that has tested positive in the last 10 days please notify you surgeon.    [facility] - Preparing for Surgery Before surgery, you can play an important role.  Because skin is not sterile, your skin needs to be as free of germs as possible.  You can reduce the number of germs on your skin by washing with CHG (chlorahexidine gluconate) soap before surgery.  CHG is an antiseptic cleaner which kills germs and bonds with the skin to continue killing germs even after washing. Please DO NOT use if you have an allergy to CHG or antibacterial soaps.  If your skin becomes reddened/irritated stop using the CHG and inform your nurse when you arrive at Short Stay. Do not shave (including legs and underarms) for at least 48 hours prior to the first CHG shower.  You may shave your face/neck.  Please follow these instructions carefully:  1.  Shower with CHG  Soap the night before surgery and the  morning of surgery.  2.  If you choose to wash your hair, wash your hair first as usual with your normal  shampoo.  3.  After you shampoo, rinse your hair and body thoroughly to remove the shampoo.                             4.  Use CHG as you would any other liquid soap.  You can apply chg directly to the skin and wash.  Gently with a scrungie or clean washcloth.  5.  Apply the CHG Soap to your body ONLY FROM THE NECK DOWN.   Do   not use on face/ open                           Wound or open sores. Avoid contact with eyes, ears mouth and   genitals (private parts).                       Wash face,  Genitals (private parts) with your normal soap.             6.  Wash thoroughly, paying special attention to the area where your    surgery  will be performed.  7.  Thoroughly rinse your body with warm water from the neck down.  8.  DO NOT shower/wash with your normal soap after using and rinsing off the CHG Soap.                9.  Pat yourself dry with a clean towel.            10.  Wear clean pajamas.            11.  Place clean sheets on your bed the night of your first shower and do not  sleep with pets. Day of Surgery : Do not apply any lotions/deodorants the morning of surgery.  Please wear clean clothes to the hospital/surgery center.  FAILURE TO FOLLOW THESE INSTRUCTIONS MAY RESULT IN THE CANCELLATION OF YOUR SURGERY  PATIENT SIGNATURE_________________________________  NURSE SIGNATURE__________________________________  ________________________________________________________________________

## 2023-08-23 ENCOUNTER — Encounter (HOSPITAL_COMMUNITY): Payer: Self-pay

## 2023-08-23 ENCOUNTER — Other Ambulatory Visit: Payer: Self-pay

## 2023-08-23 ENCOUNTER — Encounter (HOSPITAL_COMMUNITY)
Admission: RE | Admit: 2023-08-23 | Discharge: 2023-08-23 | Disposition: A | Discharge status: Home / Self Care | Payer: Medicare Other | Source: Ambulatory Visit | Attending: Urology | Admitting: Urology

## 2023-08-23 VITALS — BP 113/54 | HR 62 | Temp 98.0°F | Resp 16 | Ht 68.0 in | Wt 153.0 lb

## 2023-08-23 DIAGNOSIS — E118 Type 2 diabetes mellitus with unspecified complications: Secondary | ICD-10-CM | POA: Diagnosis not present

## 2023-08-23 DIAGNOSIS — Z01812 Encounter for preprocedural laboratory examination: Secondary | ICD-10-CM | POA: Insufficient documentation

## 2023-08-23 DIAGNOSIS — I10 Essential (primary) hypertension: Secondary | ICD-10-CM | POA: Insufficient documentation

## 2023-08-23 DIAGNOSIS — Z01818 Encounter for other preprocedural examination: Secondary | ICD-10-CM | POA: Diagnosis present

## 2023-08-23 LAB — CBC
HCT: 42.6 % (ref 39.0–52.0)
Hemoglobin: 13.6 g/dL (ref 13.0–17.0)
MCH: 28.6 pg (ref 26.0–34.0)
MCHC: 31.9 g/dL (ref 30.0–36.0)
MCV: 89.7 fL (ref 80.0–100.0)
Platelets: 208 10*3/uL (ref 150–400)
RBC: 4.75 MIL/uL (ref 4.22–5.81)
RDW: 13.4 % (ref 11.5–15.5)
WBC: 8.7 10*3/uL (ref 4.0–10.5)
nRBC: 0 % (ref 0.0–0.2)

## 2023-08-23 LAB — BASIC METABOLIC PANEL
Anion gap: 10 (ref 5–15)
BUN: 22 mg/dL (ref 8–23)
CO2: 24 mmol/L (ref 22–32)
Calcium: 9.3 mg/dL (ref 8.9–10.3)
Chloride: 105 mmol/L (ref 98–111)
Creatinine, Ser: 0.86 mg/dL (ref 0.61–1.24)
GFR, Estimated: >60 mL/min (ref >60)
Glucose, Bld: 116 mg/dL — ABNORMAL HIGH (ref 70–99)
Potassium: 4.1 mmol/L (ref 3.5–5.1)
Sodium: 139 mmol/L (ref 135–145)

## 2023-08-23 LAB — HEMOGLOBIN A1C
Hgb A1c MFr Bld: 6.8 % — ABNORMAL HIGH (ref 4.8–5.6)
Mean Plasma Glucose: 148.46 mg/dL

## 2023-08-23 LAB — GLUCOSE, CAPILLARY: Glucose-Capillary: 127 mg/dL — ABNORMAL HIGH (ref 70–99)

## 2023-09-01 NOTE — H&P (Signed)
Pt presents today for pre-operative history and physical exam in anticipation of cysto with bladder bx and fulguration by Dr. Annabell Howells on 09/03/23. He is doing well and is without complaint.   Pt denies F/C, HA, CP, SOB, N/V, diarrhea/constipation, back pain, flank pain, hematuria, and dysuria.     HX:   CC: LUTS   04/19/23: Donald Jennings is a 67 yo male who is sent by Dr. Eloise Harman for voiding symptoms and microhematuria. He had 3-10 RBC's on a UA in 5/23. He has had no gross hematuria. He has a long smoking history. His IPSS is 8 with a reduced stream. He has no nocturia. He has had some issues for 10-15 years including occasional burning and urgency. He has no history of stones or GU surgery. His PSA is about 0.5. His renal function was normal on labs in 5/23. He has some penile pain.   07/20/23: Donald Jennings returns today for a flowrate and cystoscopy. The CT showed no obvious cause of the microhematuria. He has some issues with PVD. His IPSS is 12 with some urgency and a reduced stream. He has some post void dribbling. He has some dysuriia. His UA is clear.   IMPRESSION:  1. Punctate bilateral nonobstructing nephrolithiasis. No  hydroureteronephrosis or ureterolithiasis.  2. No suspicious renal mass.  3. Multiple other nonacute observations, as described above.   Aortic Atherosclerosis (ICD10-I70.0).     ALLERGIES: No Known Drug Allergies    MEDICATIONS: Aspirin  Metformin Hcl  Metoprolol Tartrate  Omeprazole  Clopidogrel  Isosorbide  Losartan Potassium  Rosuvastatin Calcium     GU PSH: Complex Uroflow - 07/20/2023 Cystoscopy - 07/20/2023 Locm 300-399Mg /Ml Iodine,1Ml - 06/02/2023     NON-GU PSH: Cardiac Stent Placement - about 2021 Cataract surgery Hernia Repair     GU PMH: Bladder tumor/neoplasm, He has a 3mm lesion on the left dome that is concerning for an early urothelial neoplasm. I will get him set up for a cystoscopy with biopsy and fulguration. Risks of bleeding, infection, bladder  injury, need for a secondary procedure or foley, thrombotic events and anesthetic complications reviewed. I don't think he will need Gemcitabine. - 07/20/2023 BPH w/LUTS, He has mild/mod LUTS with BOO on cystoscopy. I discussed options including meds and procedures but he wants to work on his diet and see if that will help his symptoms. - 07/20/2023, He is a long term smoker and had 3-10 RBC's on a UA. He has mild chronic LUTS. I will get him back for a CT hematuria study and a flowrate with cystoscopy. , - 04/19/2023 Renal calculus, punctate stones on CT. - 07/20/2023 Urinary Urgency - 07/20/2023 Weak Urinary Stream - 07/20/2023, - 04/19/2023 Microscopic hematuria - 06/02/2023, - 04/19/2023 Dysuria - 04/19/2023    NON-GU PMH: Arthritis Coronary Artery Disease Diabetes Type 2 GERD Heart disease, unspecified Hypercholesterolemia Hypertension    FAMILY HISTORY: None    Notes: BPH in father. CAD. DM.    SOCIAL HISTORY: Marital Status: Married Current Smoking Status: Patient smokes. Has smoked since 03/31/1979. Smokes 1 pack per day.   Tobacco Use Assessment Completed: Used Tobacco in last 30 days? Does not use smokeless tobacco. Does not drink anymore.  Does not use drugs. Drinks 2 caffeinated drinks per day. Has not had a blood transfusion. 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     Notes: Smokes 1 ppd x since 1975    REVIEW OF SYSTEMS:    GU Review Male:   Patient reports burning/ pain with  urination. Patient denies frequent urination, hard to postpone urination, get up at night to urinate, leakage of urine, stream starts and stops, trouble starting your stream, have to strain to urinate , erection problems, and penile pain.  Gastrointestinal (Upper):   Patient denies vomiting, indigestion/ heartburn, and nausea.  Gastrointestinal (Lower):   Patient denies diarrhea and constipation.  Constitutional:   Patient denies fever, night sweats, weight loss, and fatigue.  Skin:    Patient denies skin rash/ lesion and itching.  Eyes:   Patient denies blurred vision and double vision.  Ears/ Nose/ Throat:   Patient denies sore throat and sinus problems.  Hematologic/Lymphatic:   Patient denies swollen glands and easy bruising.  Cardiovascular:   Patient denies leg swelling and chest pains.  Respiratory:   Patient denies cough and shortness of breath.  Endocrine:   Patient denies excessive thirst.  Musculoskeletal:   Patient denies back pain and joint pain.  Neurological:   Patient denies headaches and dizziness.  Psychologic:   Patient denies depression and anxiety.   VITAL SIGNS:      08/31/2023 03:08 PM  BP 114/62 mmHg  Pulse 92 /min  Temperature 97.0 F / 36.1 C   MULTI-SYSTEM PHYSICAL EXAMINATION:    Constitutional: Well-nourished. No physical deformities. Normally developed. Good grooming.  Neck: Neck symmetrical, not swollen. Normal tracheal position.  Respiratory: Normal breath sounds. No labored breathing, no use of accessory muscles.   Cardiovascular: Regular rate and rhythm. No murmur, no gallop.   Lymphatic: No enlargement of neck, axillae, groin.  Skin: No paleness, no jaundice, no cyanosis. No lesion, no ulcer, no rash.  Neurologic / Psychiatric: Oriented to time, oriented to place, oriented to person. No depression, no anxiety, no agitation.  Gastrointestinal: No mass, no tenderness, no rigidity, non obese abdomen.  Eyes: Normal conjunctivae. Normal eyelids.  Ears, Nose, Mouth, and Throat: Left ear no scars, no lesions, no masses. Right ear no scars, no lesions, no masses. Nose no scars, no lesions, no masses. Normal hearing. Normal lips.  Musculoskeletal: Normal gait and station of head and neck.     Complexity of Data:  Records Review:   Previous Patient Records  Urine Test Review:   Urinalysis   08/31/23  Urinalysis  Urine Appearance Clear   Urine Color Yellow   Urine Glucose Neg mg/dL  Urine Bilirubin Neg mg/dL  Urine Ketones Neg mg/dL   Urine Specific Gravity 1.020   Urine Blood Trace ery/uL  Urine pH 6.5   Urine Protein 1+ mg/dL  Urine Urobilinogen 0.2 mg/dL  Urine Nitrites Neg   Urine Leukocyte Esterase Neg leu/uL  Urine WBC/hpf 0 - 5/hpf   Urine RBC/hpf 3 - 10/hpf   Urine Epithelial Cells 0 - 5/hpf   Urine Bacteria Mod (26-50/hpf)   Urine Mucous Present   Urine Yeast NS (Not Seen)   Urine Trichomonas Not Present   Urine Cystals NS (Not Seen)   Urine Casts Hyaline   Urine Sperm Not Present    PROCEDURES:          Urinalysis w/Scope - 81001 Dipstick Dipstick Cont'd Micro  Color: Yellow Bilirubin: Neg mg/dL WBC/hpf: 0 - 5/hpf  Appearance: Clear Ketones: Neg mg/dL RBC/hpf: 3 - 78/GNF  Specific Gravity: 1.020 Blood: Trace ery/uL Bacteria: Mod (26-50/hpf)  pH: 6.5 Protein: 1+ mg/dL Cystals: NS (Not Seen)  Glucose: Neg mg/dL Urobilinogen: 0.2 mg/dL Casts: Hyaline    Nitrites: Neg Trichomonas: Not Present    Leukocyte Esterase: Neg leu/uL Mucous: Present  Epithelial Cells: 0 - 5/hpf      Yeast: NS (Not Seen)      Sperm: Not Present    ASSESSMENT:      ICD-10 Details  1 GU:   Microscopic hematuria - R31.21   2   Bladder tumor/neoplasm - D41.4    PLAN:           Orders Labs Urine Culture          Schedule Return Visit/Planned Activity: Keep Scheduled Appointment - Schedule Surgery          Document Letter(s):  Created for Patient: Clinical Summary         Notes:   There are no changes in the patients history or physical exam since last evaluation by Dr. Annabell Howells. Pt is scheduled to undergo cysto with bladder bx and fulguration on 09/03/23.   Urine for culture   All pt's questions were answered to the best of my ability.          Next Appointment:      Next Appointment: 09/03/2023 09:30 AM    Appointment Type: Surgery     Location: [facility] [phone]    Provider: Bjorn Pippin, M.D.    Reason for Visit: OP [facility] CYSTO BLD BX FULG

## 2023-09-03 ENCOUNTER — Encounter (HOSPITAL_COMMUNITY): Payer: Self-pay | Admitting: Urology

## 2023-09-03 ENCOUNTER — Encounter (HOSPITAL_COMMUNITY)
Admission: RE | Disposition: A | Discharge status: Home / Self Care | Payer: Self-pay | Source: Ambulatory Visit | Attending: Urology

## 2023-09-03 ENCOUNTER — Other Ambulatory Visit (HOSPITAL_COMMUNITY): Payer: Self-pay

## 2023-09-03 ENCOUNTER — Ambulatory Visit (HOSPITAL_BASED_OUTPATIENT_CLINIC_OR_DEPARTMENT_OTHER): Payer: Medicare Other | Admitting: Certified Registered"

## 2023-09-03 ENCOUNTER — Ambulatory Visit (HOSPITAL_COMMUNITY)
Admission: RE | Admit: 2023-09-03 | Discharge: 2023-09-03 | Disposition: A | Discharge status: Home / Self Care | Payer: Medicare Other | Source: Ambulatory Visit | Attending: Urology | Admitting: Urology

## 2023-09-03 ENCOUNTER — Ambulatory Visit (HOSPITAL_COMMUNITY): Payer: Medicare Other | Admitting: Certified Registered"

## 2023-09-03 DIAGNOSIS — N3289 Other specified disorders of bladder: Secondary | ICD-10-CM

## 2023-09-03 DIAGNOSIS — N4889 Other specified disorders of penis: Secondary | ICD-10-CM | POA: Diagnosis not present

## 2023-09-03 DIAGNOSIS — N200 Calculus of kidney: Secondary | ICD-10-CM | POA: Diagnosis present

## 2023-09-03 DIAGNOSIS — I2510 Atherosclerotic heart disease of native coronary artery without angina pectoris: Secondary | ICD-10-CM

## 2023-09-03 DIAGNOSIS — I700 Atherosclerosis of aorta: Secondary | ICD-10-CM | POA: Diagnosis not present

## 2023-09-03 DIAGNOSIS — F17210 Nicotine dependence, cigarettes, uncomplicated: Secondary | ICD-10-CM | POA: Insufficient documentation

## 2023-09-03 DIAGNOSIS — E118 Type 2 diabetes mellitus with unspecified complications: Secondary | ICD-10-CM

## 2023-09-03 HISTORY — PX: CYSTOSCOPY WITH BIOPSY: SHX5122

## 2023-09-03 LAB — GLUCOSE, CAPILLARY
Glucose-Capillary: 141 mg/dL — ABNORMAL HIGH (ref 70–99)
Glucose-Capillary: 139 mg/dL — ABNORMAL HIGH (ref 70–99)

## 2023-09-03 SURGERY — CYSTOSCOPY, WITH BIOPSY
Anesthesia: General | Site: Bladder

## 2023-09-03 MED ORDER — LACTATED RINGERS IV SOLN: INTRAVENOUS | Status: DC | PRN

## 2023-09-03 MED ORDER — CHLORHEXIDINE GLUCONATE 0.12 % MT SOLN
15.0000 mL | Freq: Once | OROMUCOSAL | Status: AC
  Administered 2023-09-03: 15 mL via OROMUCOSAL

## 2023-09-03 MED ORDER — PROPOFOL 10 MG/ML IV BOLUS
INTRAVENOUS | Status: DC | PRN
  Administered 2023-09-03: 150 mg via INTRAVENOUS

## 2023-09-03 MED ORDER — FENTANYL CITRATE PF 50 MCG/ML IJ SOSY: 25.0000 ug | PREFILLED_SYRINGE | INTRAMUSCULAR | Status: DC | PRN

## 2023-09-03 MED ORDER — FENTANYL CITRATE (PF) 100 MCG/2ML IJ SOLN
INTRAMUSCULAR | Status: DC | PRN
  Administered 2023-09-03: 25 ug via INTRAVENOUS

## 2023-09-03 MED ORDER — CEFAZOLIN SODIUM-DEXTROSE 2-4 GM/100ML-% IV SOLN
2.0000 g | INTRAVENOUS | Status: AC
  Administered 2023-09-03: 2 g via INTRAVENOUS
  Filled 2023-09-03: qty 100

## 2023-09-03 MED ORDER — ONDANSETRON HCL 4 MG/2ML IJ SOLN
INTRAMUSCULAR | Status: AC
  Filled 2023-09-03: qty 2

## 2023-09-03 MED ORDER — ACETAMINOPHEN 10 MG/ML IV SOLN: 1000.0000 mg | Freq: Once | INTRAVENOUS | Status: DC | PRN

## 2023-09-03 MED ORDER — ONDANSETRON HCL 4 MG/2ML IJ SOLN
INTRAMUSCULAR | Status: DC | PRN
  Administered 2023-09-03: 4 mg via INTRAVENOUS

## 2023-09-03 MED ORDER — DEXAMETHASONE SODIUM PHOSPHATE 10 MG/ML IJ SOLN
INTRAMUSCULAR | Status: AC
  Filled 2023-09-03: qty 1

## 2023-09-03 MED ORDER — FENTANYL CITRATE (PF) 100 MCG/2ML IJ SOLN
INTRAMUSCULAR | Status: AC
  Filled 2023-09-03: qty 2

## 2023-09-03 MED ORDER — MIDAZOLAM HCL 5 MG/5ML IJ SOLN
INTRAMUSCULAR | Status: DC | PRN
  Administered 2023-09-03: 2 mg via INTRAVENOUS

## 2023-09-03 MED ORDER — ORAL CARE MOUTH RINSE: 15.0000 mL | Freq: Once | OROMUCOSAL | Status: AC

## 2023-09-03 MED ORDER — DEXAMETHASONE SODIUM PHOSPHATE 4 MG/ML IJ SOLN
INTRAMUSCULAR | Status: DC | PRN
Start: 2023-09-03 — End: 2023-09-03
  Administered 2023-09-03: 5 mg via INTRAVENOUS

## 2023-09-03 MED ORDER — SODIUM CHLORIDE 0.9% FLUSH: 3.0000 mL | Freq: Two times a day (BID) | INTRAVENOUS | Status: DC

## 2023-09-03 MED ORDER — PHENYLEPHRINE HCL (PRESSORS) 10 MG/ML IV SOLN
INTRAVENOUS | Status: DC | PRN
Start: 2023-09-03 — End: 2023-09-03
  Administered 2023-09-03 (×3): 80 ug via INTRAVENOUS

## 2023-09-03 MED ORDER — LACTATED RINGERS IV SOLN: INTRAVENOUS | Status: DC

## 2023-09-03 MED ORDER — HYDROCODONE-ACETAMINOPHEN 5-325 MG PO TABS
1.0000 | ORAL_TABLET | Freq: Four times a day (QID) | ORAL | 0 refills | Status: AC | PRN
  Filled 2023-09-03: qty 6, 2d supply, fill #0

## 2023-09-03 MED ORDER — LIDOCAINE HCL (CARDIAC) PF 100 MG/5ML IV SOSY
PREFILLED_SYRINGE | INTRAVENOUS | Status: DC | PRN
  Administered 2023-09-03: 80 mg via INTRAVENOUS

## 2023-09-03 MED ORDER — SODIUM CHLORIDE 0.9 % IR SOLN
Status: DC | PRN
  Administered 2023-09-03: 3000 mL via INTRAVESICAL

## 2023-09-03 MED ORDER — STERILE WATER FOR IRRIGATION IR SOLN
Status: DC | PRN
Start: 2023-09-03 — End: 2023-09-03
  Administered 2023-09-03: 3000 mL

## 2023-09-03 MED ORDER — MIDAZOLAM HCL 2 MG/2ML IJ SOLN
INTRAMUSCULAR | Status: AC
  Filled 2023-09-03: qty 2

## 2023-09-03 SURGICAL SUPPLY — 22 items
BAG DRN RND TRDRP ANRFLXCHMBR (UROLOGICAL SUPPLIES)
BAG URINE DRAIN 2000ML AR STRL (UROLOGICAL SUPPLIES) IMPLANT
BAG URO CATCHER STRL LF (MISCELLANEOUS) ×1 IMPLANT
CATH FOLEY 3WAY 30CC 22FR (CATHETERS) IMPLANT
CATH URETL OPEN 5X70 (CATHETERS) IMPLANT
DRAPE FOOT SWITCH (DRAPES) ×1 IMPLANT
ELECT REM PT RETURN 15FT ADLT (MISCELLANEOUS) ×1 IMPLANT
GLOVE SURG SS PI 8.0 STRL IVOR (GLOVE) ×1 IMPLANT
GOWN STRL REUS W/ TWL XL LVL3 (GOWN DISPOSABLE) ×1 IMPLANT
GOWN STRL REUS W/TWL XL LVL3 (GOWN DISPOSABLE) ×1
HOLDER FOLEY CATH W/STRAP (MISCELLANEOUS) IMPLANT
KIT TURNOVER KIT A (KITS) IMPLANT
LOOP CUT BIPOLAR 24F LRG (ELECTROSURGICAL) IMPLANT
MANIFOLD NEPTUNE II (INSTRUMENTS) ×1 IMPLANT
PACK CYSTO (CUSTOM PROCEDURE TRAY) ×1 IMPLANT
PAD PREP 24X48 CUFFED NSTRL (MISCELLANEOUS) ×1 IMPLANT
SUT ETHILON 3 0 PS 1 (SUTURE) IMPLANT
SYR 30ML LL (SYRINGE) IMPLANT
SYR TOOMEY IRRIG 70ML (MISCELLANEOUS)
SYRINGE TOOMEY IRRIG 70ML (MISCELLANEOUS) IMPLANT
TUBING CONNECTING 10 (TUBING) ×1 IMPLANT
TUBING UROLOGY SET (TUBING) ×1 IMPLANT

## 2023-09-03 NOTE — Discharge Instructions (Addendum)
You may remove the catheter per the included instructions on Sunday morning.  If you don't feel you can do that, please call the office to come in Monday morning.

## 2023-09-03 NOTE — Interval H&P Note (Signed)
History and Physical Interval Note:  09/03/2023 8:50 AM  Donald Jennings  has presented today for surgery, with the diagnosis of BLADDER LESION.  The various methods of treatment have been discussed with the patient and family. After consideration of risks, benefits and other options for treatment, the patient has consented to  Procedure(s) with comments: CYSTOSCOPY WITH BLADDER BIOPSY AND FULGURATION (N/A) - 30 MINS FOR CASE as a surgical intervention.  [name] history has been reviewed, patient examined, no change in status, stable for surgery.  I have reviewed the patient's chart and labs.  Questions were answered to the patient's satisfaction.     Bjorn Pippin

## 2023-09-03 NOTE — Anesthesia Preprocedure Evaluation (Signed)
Anesthesia Evaluation  Patient identified by MRN, date of birth, ID band Patient awake    Reviewed: Allergy & Precautions, NPO status , Patient's Chart, lab work & pertinent test results  Airway Mallampati: II  TM Distance: >3 FB Neck ROM: Full    Dental no notable dental hx.    Pulmonary Current Smoker   Pulmonary exam normal        Cardiovascular hypertension, Pt. on medications and Pt. on home beta blockers + CAD   Rhythm:Regular Rate:Normal     Neuro/Psych    Depression    negative neurological ROS     GI/Hepatic Neg liver ROS,GERD  Medicated,,  Endo/Other  diabetes, Type 2    Renal/GU  Bladder dysfunction      Musculoskeletal negative musculoskeletal ROS (+)    Abdominal Normal abdominal exam  (+)   Peds  Hematology Lab Results      Component                Value               Date                      WBC                      8.7                 08/23/2023                HGB                      13.6                08/23/2023                HCT                      42.6                08/23/2023                MCV                      89.7                08/23/2023                PLT                      208                 08/23/2023              Anesthesia Other Findings   Reproductive/Obstetrics                             Anesthesia Physical Anesthesia Plan  ASA: 3  Anesthesia Plan: General   Post-op Pain Management:    Induction: Intravenous  PONV Risk Score and Plan: 1 and Ondansetron, Dexamethasone and Treatment may vary due to age or medical condition  Airway Management Planned: Mask and LMA  Additional Equipment: None  Intra-op Plan:   Post-operative Plan: Extubation in OR  Informed Consent: I have reviewed the patients History and Physical, chart, labs and discussed the procedure including the risks, benefits and alternatives for the  proposed anesthesia  with the patient or authorized representative who has indicated his/her understanding and acceptance.     Dental advisory given  Plan Discussed with: CRNA  Anesthesia Plan Comments:        Anesthesia Quick Evaluation

## 2023-09-03 NOTE — Anesthesia Procedure Notes (Signed)
Procedure Name: LMA Insertion Date/Time: 09/03/2023 9:49 AM  Performed by: Deri Fuelling, CRNAPre-anesthesia Checklist: Patient identified, Emergency Drugs available, Suction available and Patient being monitored Patient Re-evaluated:Patient Re-evaluated prior to induction Oxygen Delivery Method: Circle system utilized Preoxygenation: Pre-oxygenation with 100% oxygen Induction Type: IV induction Ventilation: Mask ventilation without difficulty LMA: LMA inserted LMA Size: 4.0 Tube type: Oral Number of attempts: 1 Airway Equipment and Method: Stylet and Oral airway Placement Confirmation: ETT inserted through vocal cords under direct vision, positive ETCO2 and breath sounds checked- equal and bilateral Tube secured with: Tape Dental Injury: Teeth and Oropharynx as per pre-operative assessment

## 2023-09-03 NOTE — Op Note (Signed)
Procedure: Cystoscopy with bladder biopsy and fulguration.  Preop diagnosis: Left bladder wall lesion.  Postop diagnosis: Same.  Surgeon: Dr. Bjorn Pippin.  Anesthesia: General.  Specimen: Bladder biopsy.  Drains: 18 French Foley catheter.  EBL: None.  Complications: None.  Indications: The patient is a 67 year old male who is being seen for voiding symptoms and on cystoscopy was noted to have a small mucosal lesion of the left anterior bladder wall that was concerning for neoplasm.  Was felt that biopsy was indicated.  Procedure: He was taken to the operating room where a general anesthetic was induced.  He was given Ancef.  He was placed in lithotomy position and fitted with PAS hose.  His perineum and genitalia were prepped Betadine solution was draped in usual sterile fashion.  Cystoscopy was performed using the 21 [place] scope and 30 degree lens.  Examination revealed a normal urethra.  The external sphincter was intact.  The prostatic urethra short with trilobar hyperplasia with a small middle lobe.  Examination of bladder revealed mild trabeculation.  Ureteral orifices were in the normal anatomic position.  There was a small mucosal lesion on the left anterior bladder wall without significant papillary features but some hyperplasia.  A cup biopsy forceps was used to remove the lesion intact.  The biopsy site was then fulgurated.  Inspection of the bladder wall after the biopsy demonstrated some visible fat so it was felt that a Foley catheter was indicated to ensure adequate bladder healing.  The cystoscope was removed once hemostasis was achieved and an 55 French Foley catheter was inserted.  The balloon was filled with 10 mL of sterile fluid.  The catheter was placed to straight drainage.  He was taken down for lithotomy position, his anesthetic was reversed and he was moved to recovery in stable condition.  There were no complications.

## 2023-09-03 NOTE — Transfer of Care (Signed)
Immediate Anesthesia Transfer of Care Note  Patient: Mikel Cella Blakeman  Procedure(s) Performed: CYSTOSCOPY WITH BLADDER BIOPSY AND FULGURATION (Bladder)  Patient Location: PACU  Anesthesia Type:General  Level of Consciousness: awake and alert   Airway & Oxygen Therapy: Patient Spontanous Breathing and Patient connected to nasal cannula oxygen  Post-op Assessment: Report given to RN and Post -op Vital signs reviewed and stable  Post vital signs: Reviewed and stable  Last Vitals:  Vitals Value Taken Time  BP 116/56 09/03/23 1012  Temp    Pulse 65 09/03/23 1013  Resp 13 09/03/23 1013  SpO2 100 % 09/03/23 1013  Vitals shown include unfiled device data.  Last Pain:  Vitals:   09/03/23 0745  TempSrc: Oral  PainSc:          Complications: No notable events documented.

## 2023-09-03 NOTE — Anesthesia Postprocedure Evaluation (Signed)
Anesthesia Post Note  Patient: Donald Jennings  Procedure(s) Performed: CYSTOSCOPY WITH BLADDER BIOPSY AND FULGURATION (Bladder)     Patient location during evaluation: PACU Anesthesia Type: General Level of consciousness: awake and alert Pain management: pain level controlled Vital Signs Assessment: post-procedure vital signs reviewed and stable Respiratory status: spontaneous breathing, nonlabored ventilation, respiratory function stable and patient connected to nasal cannula oxygen Cardiovascular status: blood pressure returned to baseline and stable Postop Assessment: no apparent nausea or vomiting Anesthetic complications: no   No notable events documented.  Last Vitals:  Vitals:   09/03/23 1045 09/03/23 1055  BP: 124/60 (!) 144/65  Pulse: 65 72  Resp: 11 13  Temp:  36.4 C  SpO2: 97% 99%    Last Pain:  Vitals:   09/03/23 1045  TempSrc:   PainSc: 2                  Ousman Dise P Janeka Libman

## 2023-09-04 ENCOUNTER — Encounter (HOSPITAL_COMMUNITY): Payer: Self-pay | Admitting: Urology

## 2023-09-06 LAB — SURGICAL PATHOLOGY

## 2023-09-10 ENCOUNTER — Other Ambulatory Visit (HOSPITAL_COMMUNITY): Payer: Self-pay

## 2023-11-19 ENCOUNTER — Encounter: Payer: Self-pay | Admitting: Cardiology

## 2023-12-01 NOTE — Progress Notes (Signed)
 Cardiology Office Note    Date:  12/03/2023  ID:  Donald Jennings, DOB [birth year], MRN [id number] PCP:  Yolande Toribio MATSU, MD  Cardiologist:  Wilbert Bihari, MD  Electrophysiologist:  None   Chief Complaint: Follow up for CAD   History of Present Illness: .    Donald Jennings is a 68 y.o. male with visit-pertinent history of CAD s/p DES to the mid LAD with left-to-right collaterals and 75% lesion in the distal RCA treated with DES in 07/2017, hypertension, hyperlipidemia, tobacco use, palpitations.  In 04/2018 he had an abnormal stress test showing 1.5 mm horizontal ST depression and new apical and anterior apical defects on Myoview .  Repeat cath showed widely patent stents in the mid LAD, LAD tapers to very small caliber vessel and ends just proximal to the apex, stent in the distal RCA was widely patent.  In 03/2020 he had a nuclear stress test that was a low risk and normal study.  [name] was last seen in clinic on 12/15/2022, he had remained stable from a cardiac standpoint  Today he presents for follow-up, he reports that he is doing very well.  He currently works as a airline pilot and has been extremely busy through the holiday season.  He denies chest pain, shortness of breath, lower extremity edema, palpitations, presyncope or syncope.  [place] is soon [place] be [place], notes that he will need refills of his prescriptions.    Labwork independently reviewed: 08/23/2023: Sodium 139, potassium 4.1, creatinine 0.86 ROS: .   Today denies chest pain, shortness of breath, lower extremity edema, fatigue, palpitations, melena, hematuria, hemoptysis, diaphoresis, weakness, presyncope, syncope, orthopnea, and PND.  All other systems are reviewed and otherwise negative. Studies Reviewed: SABRA   EKG:  EKG is ordered today, personally reviewed, demonstrating  EKG Interpretation Date/Time:  Friday December 03 2023 10:23:11 EST Ventricular Rate:  74 PR Interval:  178 QRS Duration:  78 QT  Interval:  360 QTC Calculation: 399 R Axis:   35  Text Interpretation: Normal sinus rhythm Nonspecific ST abnormality No significant change from prior tracing Confirmed by Ila Landowski [phone]) on 12/03/2023 12:14:40 PM   CV Studies:  Cardiac Studies & Procedures   CARDIAC CATHETERIZATION  CARDIAC CATHETERIZATION 06/14/2018  Narrative  Previously placed Prox LAD to Mid LAD stent (unknown type) is widely patent.  Mid RCA lesion is 40% stenosed.  Prox RCA lesion is 20% stenosed.  Post Atrio lesion is 40% stenosed.  Previously placed Dist RCA stent (unknown type) is widely patent.  The left ventricular ejection fraction is greater than 65% by visual estimate.  The left ventricular systolic function is normal.  LV end diastolic pressure is normal.  Hyperdynamic LV function with an ejection fraction of 65 to 70% without focal segmental wall motion abnormalities.  Widely patent previously placed stents in the mid LAD after the first diagonal vessel and in the distal RCA.  The LAD tapers to a very small caliber vessel and ends just proximal to the apex.  The ramus intermediate, and left circumflex vessel are angiographically normal.  The RCA is a dominant vessel with mild luminal irregularity diffusely with 20% proximal stenosis, 40% mid stenosis.  The stent in the distal RCA after the acute margin and proximal to the PDA is widely patent.  There is a 40% continuation branch stenosis to the PLA vessel which actually is somewhat improved from the last catheterization.  RECOMMENDATION: Medical therapy.  With the patient's positive ECG  changes on the stress test with suggestion of possible apical ischemia will further titrate beta-blocker therapy and increase Toprol -XL to 50 mg daily.  Recommend continuation of uninterrupted dual antiplatelet therapy with aspirin /Plavix .  Findings Coronary Findings Diagnostic  Dominance: Right  Left Anterior Descending Previously placed Prox LAD to Mid  LAD stent (unknown type) is widely patent.  Right Coronary Artery There is mild diffuse disease throughout the vessel. Prox RCA lesion is 20% stenosed. Mid RCA lesion is 40% stenosed. Previously placed Dist RCA stent (unknown type) is widely patent.  Right Posterior Atrioventricular Artery Post Atrio lesion is 40% stenosed.  Intervention  No interventions have been documented.   CARDIAC CATHETERIZATION  CARDIAC CATHETERIZATION 08/04/2017  Narrative 1. Total occlusion of the mid LAD with left to left collaterals present. 2. Severe stenosis of the distal RCA with diffuse plaquing of the entire RCA and otherwise nonobstructive disease. 3. Patency of the left main and left circumflex with mild nonobstructive disease in the intermediate branch 4. Normal LV systolic function with normal LVEDP 5. Successful 2 vessel PCI with stenting of the chronically occluded mid LAD and stenting of the severely stenotic distal RCA lesion  Recommend: Dual antiplatelet therapy with aspirin  and clopidogrel  for a minimum of 12 months. Aggressive risk reduction measures.  Findings Coronary Findings Diagnostic  Dominance: Right  Left Anterior Descending The lesion is type C and located at the major branch.  Ramus Intermedius Vessel is small. Collaterals Ramus filled by collaterals from Ramus.  Left Circumflex  First Obtuse Marginal Branch Vessel is small in size.  Second Obtuse Marginal Branch Vessel is small in size.  Right Coronary Artery There is mild the vessel.  The lesion is discrete and concentric.  Intervention  Prox LAD to Mid LAD lesion Angioplasty Lesion crossed with guidewire using a WIRE HI TORQ WHISPER MS 190CM. Pre-stent angioplasty was performed. A STENT RESOLUTE ONYX 2.0X30 drug eluting stent was successfully placed. There is no pre-interventional antegrade distal flow (TIMI 0).  The post-interventional distal flow is normal (TIMI 3). The intervention was successful . No  complications occurred at this lesion. The mid LAD just after the origin of a large first diagonal is totally occluded. The vessel fills from left to left collaterals. Heparin  is used for anticoagulation. A therapeutic ACT greater than 300 seconds is achieved. The patient is loaded with Plavix  600 mg on the table. An EBU 3.5 cm guide catheter is used. Initially, a cougar wire is used in an attempt to cross the lesion. The wire would not cross. The cougar wire was advanced out into the first diagonal branch. The wire was then used to successfully cross the lesion. The lesion is dilated with a 2.0 mm balloon on 2 separate inflations. This restored TIMI-3 flow. There is diffuse stenosis in the LAD with poor filling of the apical portion of the LAD. The whisper wire was actually located in a second septal perforator branch. The wire was pulled back and initial attempts were made to extend the wire down into the LAD. The wire would not cross. The cougar wire was then pulled back from the diagonal and was attempted but this was also unsuccessful. I was concerned there was a dissection plane within the LAD. After prolonged attempt the whisper wire was finally able to be successfully passed across the lesion into a distal diagonal branch. The lesion was then redilated with a long 2.0 mm balloon. The lesion was then stented with a 2.0 x 30 mm onyx DES deployed  at 12 atm. The stent is postdilated with a 2.25 mm noncompliant balloon to 14 atm. The stent was carefully positioned prior to deployment so that it was just beyond the origin of the first diagonal branch and that it fully cover the lesion. At the completion of the procedure there is 0% residual stenosis and TIMI-3 flow. Attention is then turned to the RCA. There is a 0% residual stenosis post intervention.  Dist RCA lesion Angioplasty Lesion crossed with guidewire using a WIRE COUGAR XT STRL 190CM. Pre-stent angioplasty was not performed. A STENT RESOLUTE ONYX  3.5X12 drug eluting stent was successfully placed. Post-stent angioplasty was not performed. The pre-interventional distal flow is normal (TIMI 3).  The post-interventional distal flow is normal (TIMI 3). The intervention was successful . No complications occurred at this lesion. A JR4 guide is used. The lesion is crossed with a cougar wire. The lesion is primarily stented with a 3.5 x 12 mm resolute onyx DES deployed at 14 atm. The stent is well expanded was 0% residual stenosis. There is a 0% residual stenosis post intervention.   STRESS TESTS  MYOCARDIAL PERFUSION IMAGING 04/15/2020  Narrative  Nuclear stress EF: 66%.  There was no ST segment deviation noted during stress.  The study is normal.  This is a low risk study.  The left ventricular ejection fraction is hyperdynamic (>65%).  Normal resting and stress perfusion. No ischemia or infarction EF 66%  ECHOCARDIOGRAM  ECHOCARDIOGRAM COMPLETE 06/22/2016  Narrative *[facility]* [address] [place], [place] [address] [phone]  ------------------------------------------------------------------- Transthoracic Echocardiography  Patient:    Jessen, Siegman MR #:       [id number] Study Date: 06/22/2016 Gender:     M Age:        73 Height:     172.7 cm Weight:     74.2 kg BSA:        1.9 m^2 Pt. Status: Room:  ATTENDING    Vina Gull, M.D. ORDERING     Joshua Debby LITTIE BARTON Joshua Debby LITTIE SONOGRAPHER  [place], RCS PERFORMING   [facility], Outpatient  cc:  ------------------------------------------------------------------- LV EF: 60% -   65%  ------------------------------------------------------------------- Indications:      Amautosis fugax (G45.3).  ------------------------------------------------------------------- History:   PMH:  Hyperlipidemia.  Transient ischemic attack.  Risk factors:  Current tobacco use. Hypertension. Diabetes  mellitus.  ------------------------------------------------------------------- Study Conclusions  - Left ventricle: Systolic function was normal. The estimated ejection fraction was in the range of 60% to 65%. Doppler parameters are consistent with abnormal left ventricular relaxation (grade 1 diastolic dysfunction).  ------------------------------------------------------------------- Study data:  No prior study was available for comparison.  Study status:  Routine.  Procedure:  The patient reported no pain pre or post test. Transthoracic echocardiography. Image quality was adequate.          Transthoracic echocardiography.  M-mode, complete 2D, spectral Doppler, and color Doppler.  Birthdate: Patient birthdate: 1956/05/01.  Age:  Patient is 68 yr old.  Sex: Gender: male.    BMI: 24.9 kg/m^2.  Blood pressure:     102/50 Patient status:  Outpatient.  Study date:  Study date: 06/22/2016. Study time: 01:21 PM.  Location:  [facility]  -------------------------------------------------------------------  ------------------------------------------------------------------- Left ventricle:  Systolic function was normal. The estimated ejection fraction was in the range of 60% to 65%. Doppler parameters are consistent with abnormal left ventricular relaxation (grade 1 diastolic dysfunction).  ------------------------------------------------------------------- Aortic valve:   Structurally normal valve.  Cusp separation was normal.  Doppler:  Transvalvular velocity was within the normal range. There was no stenosis. There was no regurgitation.  ------------------------------------------------------------------- Mitral valve:   Structurally normal valve.   Leaflet separation was normal.  Doppler:  Transvalvular velocity was within the normal range. There was no evidence for stenosis. There was no regurgitation.    Peak gradient (D): 2 mm  Hg.  ------------------------------------------------------------------- Left atrium:  The atrium was normal in size.  ------------------------------------------------------------------- Right ventricle:  The cavity size was normal. Wall thickness was normal. Systolic function was normal.  ------------------------------------------------------------------- Pulmonic valve:    Structurally normal valve.   Cusp separation was normal.  Doppler:  Transvalvular velocity was within the normal range. There was mild regurgitation.  ------------------------------------------------------------------- Tricuspid valve:   Structurally normal valve.   Leaflet separation was normal.  Doppler:  Transvalvular velocity was within the normal range. There was trivial regurgitation.  ------------------------------------------------------------------- Right atrium:  The atrium was normal in size.  ------------------------------------------------------------------- Pericardium:  There was no pericardial effusion.  ------------------------------------------------------------------- Systemic veins: Inferior vena cava: The vessel was normal in size. The respirophasic diameter changes were in the normal range (= 50%), consistent with normal central venous pressure. Diameter: 13.7 mm.  ------------------------------------------------------------------- Measurements  IVC                                         Value        Reference ID                                          13.7  mm     ---------  Left ventricle                              Value        Reference LV ID, ED, PLAX chordal             (L)     38.7  mm     43 - 52 LV ID, ES, PLAX chordal                     23.9  mm     23 - 38 LV fx shortening, PLAX chordal              38    %      >=29 LV PW thickness, ED                         11.6  mm     --------- IVS/LV PW ratio, ED                         1.13         <=1.3 Stroke volume, 2D                            63    ml     --------- Stroke volume/bsa, 2D                       33    ml/m^2 --------- LV ejection fraction, 1-p A4C  60    %      ---------  Ventricular septum                          Value        Reference IVS thickness, ED                           13.1  mm     ---------  LVOT                                        Value        Reference LVOT ID, S                                  19    mm     --------- LVOT area                                   2.84  cm^2   --------- LVOT peak velocity, S                       97.5  cm/s   --------- LVOT mean velocity, S                       66.5  cm/s   --------- LVOT VTI, S                                 22.2  cm     ---------  Aorta                                       Value        Reference Aortic root ID, ED                          32    mm     ---------  Left atrium                                 Value        Reference LA ID, A-P, ES                              34    mm     --------- LA ID/bsa, A-P                              1.79  cm/m^2 <=2.2 LA volume, S                                33.5  ml     --------- LA volume/bsa, S  17.7  ml/m^2 --------- LA volume, ES, 1-p A4C                      35.2  ml     --------- LA volume/bsa, ES, 1-p A4C                  18.6  ml/m^2 --------- LA volume, ES, 1-p A2C                      29.3  ml     --------- LA volume/bsa, ES, 1-p A2C                  15.5  ml/m^2 ---------  Mitral valve                                Value        Reference Mitral E-wave peak velocity                 76.2  cm/s   --------- Mitral A-wave peak velocity                 90.2  cm/s   --------- Mitral deceleration time                    158   ms     150 - 230 Mitral peak gradient, D                     2     mm Hg  --------- Mitral E/A ratio, peak                      0.8          ---------  Pulmonary arteries                          Value         Reference PA pressure, S, DP                          17    mm Hg  <=30  Tricuspid valve                             Value        Reference Tricuspid regurg peak velocity              187   cm/s   --------- Tricuspid peak RV-RA gradient               14    mm Hg  --------- Tricuspid maximal regurg velocity,          187   cm/s   --------- PISA  Systemic veins                              Value        Reference Estimated CVP                               3     mm Hg  ---------  Right ventricle  Value        Reference TAPSE                                       18.7  mm     --------- RV pressure, S, DP                          17    mm Hg  <=30  Legend: (L)  and  (H)  mark values outside specified reference range.  ------------------------------------------------------------------- Prepared and Electronically Authenticated by  Vina Gull, M.D. 2017-07-24T15:29:47    CT SCANS  CT CORONARY MORPH W/CTA COR W/SCORE 06/24/2016  Addendum 06/24/2016 11:40 AM ADDENDUM REPORT: 06/24/2016 11:38 CLINICAL DATA:  Chest pain EXAM: Cardiac CTA MEDICATIONS: Sub lingual nitro. 4mg  and lopressor  15mg  TECHNIQUE: The patient was scanned on a Philips 256 slice scanner. Gantry rotation speed was 270 msecs. Collimation was .9mm. A 100 kV prospective scan was triggered in the descending thoracic aorta at 111 HU's with 5% padding centered around 78% of the R-R interval. Average HR during the scan was 68 bpm. The 3D data set was interpreted on a dedicated work station using MPR, MIP and VRT modes. A total of 80cc of contrast was used. FINDINGS: Non-cardiac: See separate report from [facility]. No significant findings on limited lung and soft tissue windows. Calcium  Score: 43 with calcium  seen in distal RCA and proximal / mid LAD Coronary Arteries: Right dominant with no anomalies LM: Normal LAD: Less than 40% calcified disease in proximal vessel 50%  mixed plaque with significant soft plaque component in mid LAD at D2 take off IM:  Small vessel normal D1: Normal D2: Normal D3: Normal Circumflex:  Small vessel with primarily AV groove branch normal RCA: Dominant with mild 20% soft plaque in proximal and mid vessel Distal RCA just before take off of PDA 40% soft plaque PDA: Normal PLA:  Normal IMPRESSION: 1) Calcium  Score 43 mild calcium  in LAD and distal RCA 55th percentile for age and sex matched controls 2) Less than 40% calcified plaque in proximal vessel. 50% mixed but primarily soft plaque in mid LAD. 40% primarily soft plaque in distal RCA Maude Emmer Electronically Signed By: Maude Emmer M.D. On: 06/24/2016 11:38  Narrative EXAM: OVER-READ INTERPRETATION  CT CHEST The following report is an over-read performed by radiologist Dr. Toribio Cove [facility] on 06/24/2016. This over-read does not include interpretation of cardiac or coronary anatomy or pathology. The coronary calcium  score/coronary CTA interpretation by the cardiologist is attached. COMPARISON:  None. FINDINGS: Within the visualized portions of the thorax there are no suspicious appearing pulmonary nodules or masses, there is no acute consolidative airspace disease, no pleural effusions, no pneumothorax and no lymphadenopathy. Visualized portions of the upper abdomen are unremarkable. There are no aggressive appearing lytic or blastic lesions noted in the visualized portions of the skeleton. IMPRESSION: 1. No significant incidental noncardiac findings are noted. Electronically Signed: By: Toribio Aye M.D. On: 06/24/2016 10:26           Current Reported Medications:.    Current Meds  Medication Sig   albuterol (VENTOLIN HFA) 108 (90 Base) MCG/ACT inhaler Inhale 1-2 puffs into the lungs every 6 (six) hours as needed for wheezing or shortness of breath.   aspirin  EC 81 MG tablet Take 81 mg by mouth at bedtime.  diclofenac  sodium (VOLTAREN ) 1 % GEL Apply 1 application  topically 4 (four) times daily as needed for pain.   metFORMIN (GLUCOPHAGE-XR) 500 MG 24 hr tablet Take 500 mg by mouth every evening.   Multiple Vitamins-Minerals (MULTIVITAMIN ADULT) TABS Take 1 tablet by mouth at bedtime.    nitroGLYCERIN  (NITROSTAT ) 0.4 MG SL tablet Place 1 tablet (0.4 mg total) under the tongue every 5 (five) minutes as needed.   pantoprazole  (PROTONIX ) 40 MG tablet Take 1 tablet (40 mg total) by mouth daily before breakfast.   [DISCONTINUED] clopidogrel  (PLAVIX ) 75 MG tablet TAKE 1 TABLET BY MOUTH EVERY DAY (Patient taking differently: Take 75 mg by mouth at bedtime.)   [DISCONTINUED] isosorbide  mononitrate (IMDUR ) 30 MG 24 hr tablet Take 0.5 tablets (15 mg total) by mouth daily. (Patient taking differently: Take 15 mg by mouth at bedtime.)   [DISCONTINUED] losartan  (COZAAR ) 100 MG tablet TAKE 1 TABLET BY MOUTH EVERY DAY (Patient taking differently: Take 100 mg by mouth at bedtime.)   [DISCONTINUED] metoprolol  succinate (TOPROL -XL) 25 MG 24 hr tablet TAKE 2 TABLETS BY MOUTH EVERY DAY (Patient taking differently: Take 50 mg by mouth at bedtime.)   [DISCONTINUED] rosuvastatin  (CRESTOR ) 40 MG tablet TAKE 1 TABLET BY MOUTH DAILY. (Patient taking differently: Take 40 mg by mouth at bedtime. TAKE 1 TABLET BY MOUTH DAILY.)   Physical Exam:    VS:  BP 118/68   Pulse 74   Ht 5' 8 (1.727 m)   Wt 156 lb (70.8 kg)   SpO2 93%   BMI 23.72 kg/m    Wt Readings from Last 3 Encounters:  12/03/23 156 lb (70.8 kg)  09/03/23 153 lb (69.4 kg)  08/23/23 153 lb (69.4 kg)    GEN: Well nourished, well developed in no acute distress NECK: No JVD; No carotid bruits CARDIAC: RRR, no murmurs, rubs, gallops RESPIRATORY:  Clear to auscultation without rales, wheezing or rhonchi  ABDOMEN: Soft, non-tender, non-distended EXTREMITIES:  No edema; No acute deformity   Asessement and Plan:.    CAD: Catheterization in 07/2017 showing total  occlusion of the mid LAD with left to left collaterals treated with PCI/DES x 1 and 75% lesion in the distal RCA treated with PCI/DES x 1.  Repeat cardiac cath in 05/2018 for chest pain and abnormal stress test showed no change from prior cath with patent stents in the RCA and mid LAD, there was 40% PDA and 40% mid RCA stenosis.  He underwent nuclear stress test in 03/2020 that was low risk and normal study. Stable with no anginal symptoms. No indication for ischemic evaluation.  Heart healthy diet and regular cardiovascular exercise encouraged. Continue aspirin  81 mg daily, Plavix  75 mg daily, Imdur  15 mg daily, Toprol  50 mg daily and Crestor  40 mg daily. Refills provided. Check CMET and fasting lipid profile.   Hypertension: Blood pressure today 118/68. Continue current antihypertensive regimen.  Hyperlipidemia: Last lipid profile on 12/15/2022 indicated total cholesterol 95, HDL 38, triglycerides 73 and LDL 41.  Check fasting lipid profile and CMET, continue Crestor  40 mg daily pending labs.  Type 2 diabetes mellitus: Last hemoglobin A1C 6.8 on 08/23/23. Monitored and managed per PCP.    Disposition: F/u with Dr. Shlomo in one year.   Signed, Jarelly Rinck D Keajah Killough, NP

## 2023-12-03 ENCOUNTER — Encounter: Payer: Self-pay | Admitting: Cardiology

## 2023-12-03 ENCOUNTER — Ambulatory Visit: Payer: Medicare Other | Attending: Cardiology | Admitting: Cardiology

## 2023-12-03 VITALS — BP 118/68 | HR 74 | Ht 68.0 in | Wt 156.0 lb

## 2023-12-03 DIAGNOSIS — I2510 Atherosclerotic heart disease of native coronary artery without angina pectoris: Secondary | ICD-10-CM | POA: Diagnosis not present

## 2023-12-03 DIAGNOSIS — I2583 Coronary atherosclerosis due to lipid rich plaque: Secondary | ICD-10-CM | POA: Diagnosis not present

## 2023-12-03 DIAGNOSIS — E785 Hyperlipidemia, unspecified: Secondary | ICD-10-CM | POA: Diagnosis present

## 2023-12-03 DIAGNOSIS — I10 Essential (primary) hypertension: Secondary | ICD-10-CM | POA: Diagnosis not present

## 2023-12-03 DIAGNOSIS — E118 Type 2 diabetes mellitus with unspecified complications: Secondary | ICD-10-CM | POA: Diagnosis not present

## 2023-12-03 LAB — COMPREHENSIVE METABOLIC PANEL
ALT: 16 [IU]/L (ref 0–44)
AST: 18 [IU]/L (ref 0–40)
Albumin: 4.6 g/dL (ref 3.9–4.9)
Alkaline Phosphatase: 64 [IU]/L (ref 44–121)
BUN/Creatinine Ratio: 25 — ABNORMAL HIGH (ref 10–24)
BUN: 24 mg/dL (ref 8–27)
Bilirubin Total: 0.3 mg/dL (ref 0.0–1.2)
CO2: 22 mmol/L (ref 20–29)
Calcium: 9.5 mg/dL (ref 8.6–10.2)
Chloride: 104 mmol/L (ref 96–106)
Creatinine, Ser: 0.97 mg/dL (ref 0.76–1.27)
Globulin, Total: 2.5 g/dL (ref 1.5–4.5)
Glucose: 132 mg/dL — ABNORMAL HIGH (ref 70–99)
Potassium: 4.4 mmol/L (ref 3.5–5.2)
Sodium: 141 mmol/L (ref 134–144)
Total Protein: 7.1 g/dL (ref 6.0–8.5)
eGFR: 86 mL/min/{1.73_m2} (ref >59)

## 2023-12-03 LAB — LIPID PANEL
Chol/HDL Ratio: 2.9 {ratio} (ref 0.0–5.0)
Cholesterol, Total: 89 mg/dL — ABNORMAL LOW (ref 100–199)
HDL: 31 mg/dL — ABNORMAL LOW (ref >39)
LDL Chol Calc (NIH): 40 mg/dL (ref 0–99)
Triglycerides: 87 mg/dL (ref 0–149)
VLDL Cholesterol Cal: 18 mg/dL (ref 5–40)

## 2023-12-03 MED ORDER — ROSUVASTATIN CALCIUM 40 MG PO TABS: ORAL_TABLET | ORAL | 3 refills | Status: AC

## 2023-12-03 MED ORDER — ISOSORBIDE MONONITRATE ER 30 MG PO TB24: 15.0000 mg | ORAL_TABLET | Freq: Every day | ORAL | 3 refills | Status: AC

## 2023-12-03 MED ORDER — CLOPIDOGREL BISULFATE 75 MG PO TABS: 75.0000 mg | ORAL_TABLET | Freq: Every day | ORAL | 3 refills | Status: AC

## 2023-12-03 MED ORDER — LOSARTAN POTASSIUM 100 MG PO TABS: 100.0000 mg | ORAL_TABLET | Freq: Every day | ORAL | 3 refills | Status: AC

## 2023-12-03 MED ORDER — METOPROLOL SUCCINATE ER 25 MG PO TB24: 50.0000 mg | ORAL_TABLET | Freq: Every day | ORAL | 3 refills | Status: AC

## 2023-12-03 NOTE — Patient Instructions (Signed)
 Medication Instructions:  No changes *If you need a refill on your cardiac medications before your next appointment, please call your pharmacy*  Lab Work: Today we will draw Lipid panel and Cmet If you have labs (blood work) drawn today and your tests are completely normal, you will receive your results only by: MyChart Message (if you have MyChart) OR A paper copy in the mail If you have any lab test that is abnormal or we need to change your treatment, we will call you to review the results.  Testing/Procedures: No testing  Follow-Up: At [facility], you and your health needs are our priority.  As part of our continuing mission to provide you with exceptional heart care, we have created designated Provider Care Teams.  These Care Teams include your primary Cardiologist (physician) and Advanced Practice Providers (APPs -  Physician Assistants and Nurse Practitioners) who all work together to provide you with the care you need, when you need it.  We recommend signing up for the patient portal called MyChart.  Sign up information is provided on this After Visit Summary.  MyChart is used to connect with patients for Virtual Visits (Telemedicine).  Patients are able to view lab/test results, encounter notes, upcoming appointments, etc.  Non-urgent messages can be sent to your provider as well.   To learn more about what you can do with MyChart, go to [email or web address].    Your next appointment:   1 year(s)  Provider:   Wilbert Bihari, MD

## 2023-12-06 ENCOUNTER — Telehealth: Payer: Self-pay

## 2023-12-06 NOTE — Telephone Encounter (Signed)
 Called patient advised of below they verbalized understanding.

## 2023-12-06 NOTE — Telephone Encounter (Signed)
-----   Message from Rip Harbour sent at 12/05/2023  4:21 PM EST ----- Please let Mr. Ofallon know that his LDL is well controlled at 40. His kidney function and liver function is normal and his electrolytes are normal. Continue current medications.

## 2024-07-25 ENCOUNTER — Other Ambulatory Visit: Payer: Self-pay | Admitting: Internal Medicine

## 2024-07-25 DIAGNOSIS — F17210 Nicotine dependence, cigarettes, uncomplicated: Secondary | ICD-10-CM

## 2024-08-01 ENCOUNTER — Ambulatory Visit
Admission: RE | Admit: 2024-08-01 | Discharge: 2024-08-01 | Disposition: A | Discharge status: Home / Self Care | Source: Ambulatory Visit | Attending: Internal Medicine | Admitting: Internal Medicine

## 2024-08-01 DIAGNOSIS — F17210 Nicotine dependence, cigarettes, uncomplicated: Secondary | ICD-10-CM

## 2024-10-04 ENCOUNTER — Encounter: Payer: Self-pay | Admitting: Gastroenterology

## 2024-11-21 ENCOUNTER — Ambulatory Visit: Admitting: Gastroenterology

## 2024-12-26 ENCOUNTER — Ambulatory Visit: Admitting: Gastroenterology

## 2024-12-26 ENCOUNTER — Encounter: Payer: Self-pay | Admitting: Gastroenterology

## 2024-12-26 VITALS — BP 118/66 | HR 66 | Ht 68.0 in | Wt 151.2 lb

## 2024-12-26 DIAGNOSIS — K222 Esophageal obstruction: Secondary | ICD-10-CM | POA: Diagnosis not present

## 2024-12-26 DIAGNOSIS — K5909 Other constipation: Secondary | ICD-10-CM | POA: Insufficient documentation

## 2024-12-26 DIAGNOSIS — R1310 Dysphagia, unspecified: Secondary | ICD-10-CM | POA: Diagnosis not present

## 2024-12-26 DIAGNOSIS — Z7902 Long term (current) use of antithrombotics/antiplatelets: Secondary | ICD-10-CM | POA: Diagnosis not present

## 2024-12-26 DIAGNOSIS — K5900 Constipation, unspecified: Secondary | ICD-10-CM

## 2024-12-26 DIAGNOSIS — R1319 Other dysphagia: Secondary | ICD-10-CM

## 2024-12-26 NOTE — Progress Notes (Signed)
 "    12/26/2024 Donald Jennings [id number] 1956/10/01   Discussed the use of AI scribe software for clinical note transcription with the patient, who gave verbal consent to proceed.  History of Present Illness Donald Jennings is a 69 year old male with esophageal dysphagia and constipation who presents for pre-colonoscopy evaluation and review of gastrointestinal symptoms.  He is a patient of Dr. Darilyn.  Last colonoscopy was in August 2016, with the next screening due in August 2026.  He has longstanding intermittent dysphagia, primarily with liquids. A recent episode involved a sip of cold Pepsi stuck in his throat, causing a choking sensation. Such episodes are infrequent, with only one this year. Solid food rarely causes symptoms. Swallowing difficulties are more likely with rapid drinking, cold liquids, or taking medications with water  in the morning. Multiple upper endoscopies with esophageal dilation have been performed, and esophageal manometry in 2021 was abnormal with hypercontractility of the esophagus. Peppermint Altoids were previously used for symptom relief but were discontinued after the patient ran out and forgot to use them.  Intermittent constipation and bloating are often associated with dietary changes. Last year, severe bloating and vomiting occurred after consuming large amounts of spicy food and frequent social eating in [place], with persistent symptoms in [place] and [place] . After returning to [place] , he developed pneumonia followed by COVID-19, and reports ongoing fatigue and brain fog since. Over the past two weeks, another episode of bloating and constipation improved with increased intake of fruits, vegetables, and homemade herbal teas. Flaxseed oil was previously tried for constipation and psoriasis without benefit and discontinued. No blood in the stool.  Acid reflux occasionally flare. Self-massage techniques in the epigastrium have provided symptom  relief.  Psoriasis has recently flared, involving his hands and the bottom of his left foot. He notes that flares sometimes follow episodes of gastrointestinal upset. Topical creams have provided limited benefit.   EGD 12/2018: Impression: - Tortuous esophagus. - Erythematous mucosa in the antrum. - Erythematous duodenopathy. - The examination was otherwise normal. - Dilation performed in the entire esophagus. - No specimens collected.  Manometry in July 2021 showed hypercontractile esophagus.  Was instructed to take peppermint Altoid aids.   Colonoscopy 07/2015 with Dr. Rollin was normal.  10 year recall.   Past Medical History:  Diagnosis Date   Abnormal nuclear stress test 06/13/2018   Abnormal thallium stress test 05/20/2016   Amaurosis fugax 06/03/2016   CAD (coronary artery disease), native coronary artery 07/27/2017   < 40% prox LAD, 50% mid LAD and 40% distal RCA // Myoview  03/2020: EF 66, no ischemia or infarction, Low Risk   Carotid artery stenosis 07/27/2017   1-39% bilateral   CERVICAL RADICULOPATHY 07/02/2010   Qualifier: Diagnosis of  By: Joshua MD, Debby CROME.    Coronary artery disease due to lipid rich plaque    Coronary artery disease with exertional angina 08/04/2017   DEPRESSION 03/21/2009   Qualifier: Diagnosis of  By: Joshua MD, Debby CROME.    Diabetes mellitus without complication (HCC)    Dysphagia 05/18/2016   Eczema, dyshidrotic 05/27/2015   ELECTROCARDIOGRAM, ABNORMAL 03/21/2009   Essential hypertension 06/19/2009        GERD 06/19/2009   Qualifier: Diagnosis of  By: Joshua MD, Debby CROME.    HLD (hyperlipidemia)    Hx of cardiovascular stress test    ETT-Myoview  (03/2014):  No ischemia, EF 56%; Low Risk   Hyperlipidemia LDL goal <70 06/03/2016   Lytic bone lesions on  xray 02/22/2017   Neuropathy of both feet 05/28/2015   Pure hypercholesterolemia    Routine general medical examination at a health care facility 04/04/2013   SHOULDER INJURY, RIGHT 07/02/2010    Qualifier: Diagnosis of  By: Joshua MD, Debby CROME.    SOB (shortness of breath) 06/10/2016   TOBACCO USE 03/21/2009   Qualifier: Diagnosis of  By: Joshua MD, Debby CROME.    Type 2 diabetes mellitus with manifestations (HCC) 03/27/2015   not started the medication yet!   Unstable angina (HCC)    Vertebral artery stenosis, asymptomatic 06/22/2016   Vitamin D  deficiency 04/04/2013   Past Surgical History:  Procedure Laterality Date   CARDIAC CATHETERIZATION  2018   placement of one stent   CATARACT EXTRACTION Bilateral    Left 2009 at [facility], Right 2004 in [place]   COLONOSCOPY  07/25/2015   Dr Belvie Just   CORONARY STENT INTERVENTION N/A 08/04/2017   Procedure: CORONARY STENT INTERVENTION;  Surgeon: Wonda Sharper, MD;  Location: [facility] INVASIVE CV LAB;  Service: Cardiovascular;  Laterality: N/A;   CYSTOSCOPY WITH BIOPSY N/A 09/03/2023   Procedure: CYSTOSCOPY WITH BLADDER BIOPSY AND FULGURATION;  Surgeon: Watt Rush, MD;  Location: [facility] ORS;  Service: Urology;  Laterality: N/A;  30 MINS FOR CASE   ESOPHAGEAL MANOMETRY N/A 11/14/2018   Procedure: ESOPHAGEAL MANOMETRY (EM);  Surgeon: Avram Lupita BRAVO, MD;  Location: [facility] ENDOSCOPY;  Service: Endoscopy;  Laterality: N/A;   ESOPHAGEAL MANOMETRY N/A 06/05/2020   Procedure: ESOPHAGEAL MANOMETRY (EM);  Surgeon: Avram Lupita BRAVO, MD;  Location: [facility] ENDOSCOPY;  Service: Endoscopy;  Laterality: N/A;   INGUINAL HERNIA REPAIR Right 1986   IR FLUORO GUIDED NEEDLE PLC ASPIRATION/INJECTION LOC  03/02/2017   IR FLUORO GUIDED NEEDLE PLC ASPIRATION/INJECTION LOC  03/02/2017   IR FLUORO GUIDED NEEDLE PLC ASPIRATION/INJECTION LOC  03/02/2017   IR RADIOLOGIST EVAL & MGMT  03/01/2017   LEFT HEART CATH AND CORONARY ANGIOGRAPHY N/A 08/04/2017   Procedure: LEFT HEART CATH AND CORONARY ANGIOGRAPHY;  Surgeon: Wonda Sharper, MD;  Location: [facility] INVASIVE CV LAB;  Service: Cardiovascular;  Laterality: N/A;   LEFT HEART CATH AND CORONARY ANGIOGRAPHY N/A 06/14/2018   Procedure: LEFT HEART  CATH AND CORONARY ANGIOGRAPHY;  Surgeon: Burnard Debby LABOR, MD;  Location: [facility] INVASIVE CV LAB;  Service: Cardiovascular;  Laterality: N/A;    reports that he has been smoking cigarettes. He has a 1.9 pack-year smoking history. He has never used smokeless tobacco. He reports that he does not drink alcohol and does not use drugs. family history includes Arthritis in his mother; CAD in his brother and brother; Diabetes in his mother; Gallstones in his brother; Heart disease in his brother, brother, and father; Hypertension in his father; Kidney Stones in his brother; Stroke in his brother and father. Allergies[1]    Outpatient Encounter Medications as of 12/26/2024  Medication Sig   albuterol (VENTOLIN HFA) 108 (90 Base) MCG/ACT inhaler Inhale 1-2 puffs into the lungs every 6 (six) hours as needed for wheezing or shortness of breath.   aspirin  EC 81 MG tablet Take 81 mg by mouth at bedtime.   clopidogrel  (PLAVIX ) 75 MG tablet Take 1 tablet (75 mg total) by mouth daily.   diclofenac  sodium (VOLTAREN ) 1 % GEL Apply 1 application  topically 4 (four) times daily as needed for pain.   isosorbide  mononitrate (IMDUR ) 30 MG 24 hr tablet Take 0.5 tablets (15 mg total) by mouth daily.   losartan  (COZAAR ) 100 MG tablet Take 1 tablet (100  mg total) by mouth daily.   metFORMIN (GLUCOPHAGE-XR) 500 MG 24 hr tablet Take 500 mg by mouth every evening.   metoprolol  succinate (TOPROL -XL) 25 MG 24 hr tablet Take 2 tablets (50 mg total) by mouth daily.   Multiple Vitamins-Minerals (MULTIVITAMIN ADULT) TABS Take 1 tablet by mouth at bedtime.    nitroGLYCERIN  (NITROSTAT ) 0.4 MG SL tablet Place 1 tablet (0.4 mg total) under the tongue every 5 (five) minutes as needed.   pantoprazole  (PROTONIX ) 40 MG tablet Take 1 tablet (40 mg total) by mouth daily before breakfast.   rosuvastatin  (CRESTOR ) 40 MG tablet TAKE 1 TABLET BY MOUTH DAILY.   No facility-administered encounter medications on file as of 12/26/2024.    REVIEW OF  SYSTEMS  : All other systems reviewed and negative except where noted in the History of Present Illness.   PHYSICAL EXAM: Ht 5' 8 (1.727 m)   Wt 151 lb 4 oz (68.6 kg)   BMI 23.00 kg/m  General: Well developed male in no acute distress Head: Normocephalic and atraumatic Eyes:  Sclerae anicteric, conjunctiva pink. Ears: Normal auditory acuity Lungs: Clear throughout to auscultation; no W/R/R. Heart: Regular rate and rhythm; no M/R/G. Abdomen: Soft, non-distended.  BS present.  Minimal diffuse TTP. Musculoskeletal: Symmetrical with no gross deformities  Skin: No lesions on visible extremities Neurological: Alert oriented x 4, grossly non-focal Psychological:  Alert and cooperative. Normal mood and affect  Assessment & Plan Colonoscopy planning and management of antiplatelet therapy He is due for screening colonoscopy in August 2026, requiring peri-procedural management of clopidogrel  with cardiology coordination. He has no current alarming gastrointestinal symptoms. - Planned colonoscopy for August 2026; scheduled office visit in July for procedure coordination. - Discussed need for cardiology approval and management of clopidogrel  prior to colonoscopy. - Instructed him to notify via MyChart if constipation, bloating, or other gastrointestinal symptoms worsen or persist, in which case colonoscopy may be scheduled earlier.  Esophageal dysmotility Chronic, intermittent esophageal dysmotility/hypercontractility with rare episodes of dysphagia to liquids only, most consistent with esophageal spasm rather than structural narrowing. Symptoms are infrequent and not severe. - Recommended peppermint Altoids as needed for symptomatic relief. - Advised avoidance of cold and carbonated beverages; consume liquids at room temperature or warm. - Instructed him to eat and drink slowly and mindfully to minimize symptoms. - Advised to report increased frequency or severity of  symptoms.  Constipation Intermittent, mild constipation associated with dietary indiscretion, currently improving with dietary modifications and without alarming features. - Advised increased intake of fruits, vegetables, and fiber-rich foods. - Encouraged adequate hydration. - Instructed him to report if constipation worsens or becomes persistent.   CC:  Yolande Toribio MATSU, MD        [1]  Allergies Allergen Reactions   Porcine (Pork) Protein-Containing Drug Products     Per pt, not allergic to pork!   "

## 2024-12-26 NOTE — Patient Instructions (Signed)
 Use Atloids as needed to help reflux esophagus.   Follow up in July/2026 to schedule colonoscopy.   _______________________________________________________  If your blood pressure at your visit was 140/90 or greater, please contact your primary care physician to follow up on this.  _______________________________________________________  If you are age 68 or older, your body mass index should be between 23-30. Your Body mass index is 23 kg/m. If this is out of the aforementioned range listed, please consider follow up with your Primary Care Provider.  If you are age 53 or younger, your body mass index should be between 19-25. Your Body mass index is 23 kg/m. If this is out of the aformentioned range listed, please consider follow up with your Primary Care Provider.   ________________________________________________________  The [facility] providers would like to encourage you to use MYCHART to communicate with providers for non-urgent requests or questions.  Due to long hold times on the telephone, sending your provider a message by [facility] may be a faster and more efficient way to get a response.  Please allow 48 business hours for a response.  Please remember that this is for non-urgent requests.  _______________________________________________________  [facility] is using a team-based approach to care.  Your team is made up of your doctor and two to three APPS. Our APPS (Nurse Practitioners and Physician Assistants) work with your physician to ensure care continuity for you. They are fully qualified to address your health concerns and develop a treatment plan. They communicate directly with your gastroenterologist to care for you. Seeing the Advanced Practice Practitioners on your physician's team can help you by facilitating care more promptly, often allowing for earlier appointments, access to diagnostic testing, procedures, and other specialty referrals.

## 2025-01-04 ENCOUNTER — Other Ambulatory Visit: Payer: Self-pay | Admitting: Cardiology

## 2025-01-04 DIAGNOSIS — E118 Type 2 diabetes mellitus with unspecified complications: Secondary | ICD-10-CM

## 2025-01-04 DIAGNOSIS — I10 Essential (primary) hypertension: Secondary | ICD-10-CM
